# Patient Record
Sex: Female | Born: 1960 | Race: White | Hispanic: No | Marital: Married | Smoking: Former smoker
Health system: Southern US, Community
[De-identification: ages and names within clinical notes are randomized; demographics above are authoritative.]

## PROBLEM LIST (undated history)

## (undated) DIAGNOSIS — F329 Major depressive disorder, single episode, unspecified: Secondary | ICD-10-CM

## (undated) DIAGNOSIS — D2339 Other benign neoplasm of skin of other parts of face: Secondary | ICD-10-CM

## (undated) DIAGNOSIS — N390 Urinary tract infection, site not specified: Secondary | ICD-10-CM

## (undated) DIAGNOSIS — K921 Melena: Secondary | ICD-10-CM

## (undated) DIAGNOSIS — B019 Varicella without complication: Secondary | ICD-10-CM

## (undated) DIAGNOSIS — K219 Gastro-esophageal reflux disease without esophagitis: Secondary | ICD-10-CM

## (undated) DIAGNOSIS — T7840XA Allergy, unspecified, initial encounter: Secondary | ICD-10-CM

## (undated) DIAGNOSIS — F32A Depression, unspecified: Secondary | ICD-10-CM

## (undated) DIAGNOSIS — F419 Anxiety disorder, unspecified: Secondary | ICD-10-CM

## (undated) DIAGNOSIS — H269 Unspecified cataract: Secondary | ICD-10-CM

## (undated) HISTORY — DX: Melena: K92.1

## (undated) HISTORY — DX: Gastro-esophageal reflux disease without esophagitis: K21.9

## (undated) HISTORY — PX: ABDOMINAL HYSTERECTOMY: SHX81

## (undated) HISTORY — PX: APPENDECTOMY: SHX54

## (undated) HISTORY — DX: Varicella without complication: B01.9

## (undated) HISTORY — PX: TUBAL LIGATION: SHX77

## (undated) HISTORY — DX: Unspecified cataract: H26.9

## (undated) HISTORY — PX: OTHER SURGICAL HISTORY: SHX169

## (undated) HISTORY — DX: Urinary tract infection, site not specified: N39.0

## (undated) HISTORY — DX: Other benign neoplasm of skin of other parts of face: D23.39

## (undated) HISTORY — DX: Allergy, unspecified, initial encounter: T78.40XA

## (undated) HISTORY — PX: CHOLECYSTECTOMY: SHX55

## (undated) HISTORY — PX: TONSILLECTOMY: SUR1361

---

## 2000-01-16 HISTORY — PX: TOTAL VAGINAL HYSTERECTOMY: SHX2548

## 2010-08-29 ENCOUNTER — Ambulatory Visit: Payer: Self-pay | Admitting: General Practice

## 2010-08-31 ENCOUNTER — Ambulatory Visit: Payer: Self-pay | Admitting: General Practice

## 2011-06-04 ENCOUNTER — Ambulatory Visit: Payer: Self-pay | Admitting: Family Medicine

## 2012-02-25 ENCOUNTER — Ambulatory Visit: Payer: Self-pay | Admitting: General Practice

## 2013-01-19 DIAGNOSIS — R768 Other specified abnormal immunological findings in serum: Secondary | ICD-10-CM | POA: Insufficient documentation

## 2013-04-18 ENCOUNTER — Emergency Department (HOSPITAL_COMMUNITY): Payer: No Typology Code available for payment source

## 2013-04-18 ENCOUNTER — Encounter (HOSPITAL_COMMUNITY): Payer: Self-pay | Admitting: Emergency Medicine

## 2013-04-18 ENCOUNTER — Emergency Department (HOSPITAL_COMMUNITY)
Admission: EM | Admit: 2013-04-18 | Discharge: 2013-04-18 | Disposition: A | Discharge status: Home / Self Care | Payer: No Typology Code available for payment source | Attending: Emergency Medicine | Admitting: Emergency Medicine

## 2013-04-18 DIAGNOSIS — Z8659 Personal history of other mental and behavioral disorders: Secondary | ICD-10-CM | POA: Insufficient documentation

## 2013-04-18 DIAGNOSIS — W010XXA Fall on same level from slipping, tripping and stumbling without subsequent striking against object, initial encounter: Secondary | ICD-10-CM | POA: Insufficient documentation

## 2013-04-18 DIAGNOSIS — F17200 Nicotine dependence, unspecified, uncomplicated: Secondary | ICD-10-CM | POA: Insufficient documentation

## 2013-04-18 DIAGNOSIS — S93409A Sprain of unspecified ligament of unspecified ankle, initial encounter: Secondary | ICD-10-CM | POA: Insufficient documentation

## 2013-04-18 DIAGNOSIS — S43402A Unspecified sprain of left shoulder joint, initial encounter: Secondary | ICD-10-CM

## 2013-04-18 DIAGNOSIS — S93401A Sprain of unspecified ligament of right ankle, initial encounter: Secondary | ICD-10-CM

## 2013-04-18 DIAGNOSIS — IMO0002 Reserved for concepts with insufficient information to code with codable children: Secondary | ICD-10-CM | POA: Insufficient documentation

## 2013-04-18 DIAGNOSIS — Y929 Unspecified place or not applicable: Secondary | ICD-10-CM | POA: Insufficient documentation

## 2013-04-18 DIAGNOSIS — Y9301 Activity, walking, marching and hiking: Secondary | ICD-10-CM | POA: Insufficient documentation

## 2013-04-18 HISTORY — DX: Depression, unspecified: F32.A

## 2013-04-18 HISTORY — DX: Anxiety disorder, unspecified: F41.9

## 2013-04-18 HISTORY — DX: Major depressive disorder, single episode, unspecified: F32.9

## 2013-04-18 MED ORDER — OXYCODONE-ACETAMINOPHEN 5-325 MG PO TABS
1.0000 | ORAL_TABLET | Freq: Once | ORAL | Status: AC
  Administered 2013-04-18: 1 via ORAL
  Filled 2013-04-18: qty 1

## 2013-04-18 MED ORDER — IBUPROFEN 800 MG PO TABS
800.0000 mg | ORAL_TABLET | Freq: Once | ORAL | Status: AC
  Administered 2013-04-18: 800 mg via ORAL
  Filled 2013-04-18: qty 1

## 2013-04-18 MED ORDER — OXYCODONE-ACETAMINOPHEN 5-325 MG PO TABS: 1.0000 | ORAL_TABLET | ORAL | Status: DC | PRN

## 2013-04-18 NOTE — Discharge Instructions (Signed)

## 2013-04-18 NOTE — ED Provider Notes (Signed)
CSN: [id number]     Arrival date & time 04/18/13  1435 History  This chart was scribed for Sharyon Cable, MD,  by Stacy Gardner, ED Scribe. The patient was seen in room [facility] and the patient's care was started at 3:30 PM.   First MD Initiated Contact with Patient 04/18/13 1524     Chief Complaint  Patient presents with  . Fall      The history is provided by the patient and medical records. No language interpreter was used.   HPI Comments: Meghan Young is a 53 y.o. female who presents to the Emergency Department complaining of fall that occurred four hours ago. Pt mentions that she was walking downstairs when she was carrying a dolly when she slipped. Pt was able to grab a ramp to break her fall. She reports injuring her right ankle and right arm. She mentions having mild swelling to her R wrist and hand.  The pain is worse at her shoulder and wrist. Denies LOC and head injury.  She did not land on left shoulder, but she reports "twisting" the shoulder while trying to stop her fall Past Medical History  Diagnosis Date  . Anxiety   . Depression    Past Surgical History  Procedure Laterality Date  . Tonsillectomy    . Cholecystectomy    . Appendectomy    . Tubal ligation    . Abdominal hysterectomy     No family history on file. History  Substance Use Topics  . Smoking status: Current Some Day Smoker  . Smokeless tobacco: Not on file  . Alcohol Use: Yes     Comment: daily   OB History   Grav Para Term Preterm Abortions TAB SAB Ect Mult Living                 Review of Systems  Musculoskeletal: Positive for arthralgias, joint swelling and myalgias.  All other systems reviewed and are negative.      Allergies  Review of patient's allergies indicates no known allergies.  Home Medications  No current outpatient prescriptions on file. BP 115/74  Pulse 102  Temp(Src) 97.8 F (36.6 C) (Oral)  Resp 18  Ht 5' 3.5" (1.613 m)  Wt 220 lb (99.791 kg)   BMI 38.36 kg/m2  SpO2 100% Physical Exam CONSTITUTIONAL: Well developed/well nourished HEAD: Normocephalic/atraumatic EYES: EOMI/PERRL ENMT: Mucous membranes moist NECK: supple no meningeal signs SPINE:entire spine nontender CV: S1/S2 noted, no murmurs/rubs/gallops noted LUNGS: Lungs are clear to auscultation bilaterally, no apparent distress ABDOMEN: soft, nontender, no rebound or guarding GU:no cva tenderness NEURO: Pt is awake/alert, moves all extremitiesx4 EXTREMITIES: pulses normal, full ROM, tenderness to palpation of the L shoulder, no deformity , she is able to abduct shoulder, tenderness to palpation of R ankle with minimal swelling. All other extremities/joints palpated/ranged and nontender SKIN: warm, color normal PSYCH: anxious   ED Course  Procedures  DIAGNOSTIC STUDIES: Oxygen Saturation is 100% on room air, normal by my interpretation.    COORDINATION OF CARE:  3:44 PM Discussed course of care with pt . Pt understands and agrees. Imaging Review Dg Ankle Complete Right  04/18/2013   CLINICAL DATA:  Ankle pain  EXAM: RIGHT ANKLE - COMPLETE 3+ VIEW  COMPARISON:  None.  FINDINGS: Generalized soft tissue swelling is noted. No acute fracture or dislocation is seen. A small plantar spur is noted.  IMPRESSION: Soft tissue swelling without acute bony abnormality.   Electronically Signed   By: Inez Catalina  M.D.   On: 04/18/2013 15:27   Dg Shoulder Left  04/18/2013   CLINICAL DATA:  Pain post trauma  EXAM: LEFT SHOULDER - 2+ VIEW  COMPARISON:  None.  FINDINGS: Frontal, Y scapular, and axillary images were obtained. No fracture or dislocation. Joint spaces appear intact. No erosive change.  IMPRESSION: No abnormality noted.   Electronically Signed   By: Lowella Grip M.D.   On: 04/18/2013 15:30    I doubt occult fracture or dislocation to left shoulder Pt reports she can ambulate Advised that she may have significant disruption to left rotator cuff but no bony injury, but may  take significant time to heal Advised appropriate use of sling and pain meds (utilize ibuprofen) Ortho f/u advised     MDM   Final diagnoses:  Sprain of left shoulder  Right ankle sprain    Nursing notes including past medical history and social history reviewed and considered in documentation xrays reviewed and considered   I personally performed the services described in this documentation, which was scribed in my presence. The recorded information has been reviewed and is accurate.     Sharyon Cable, MD 04/18/13 [phone]

## 2013-04-18 NOTE — ED Notes (Signed)
Pt reports fell this am around 0700 and c/o pain to left shoulder and r ankle.  PT says foot slipped on steps.

## 2013-04-20 ENCOUNTER — Telehealth: Payer: Self-pay | Admitting: Orthopedic Surgery

## 2013-04-20 NOTE — Telephone Encounter (Signed)
Patient called today to request an appointment with Dr. Aline Brochure for a follow up from [facility] for a left shoulder injury.Told her Dr, Aline Brochure is out of the Office this week and offered an appointment for 04/27/13.  She did not want to wait that long and said she will call other offices to try to get a sooner appointment. Told her to call us back if she decides to schedule here next week

## 2014-09-28 ENCOUNTER — Ambulatory Visit
Admission: RE | Admit: 2014-09-28 | Discharge: 2014-09-28 | Disposition: A | Discharge status: Home / Self Care | Payer: No Typology Code available for payment source | Source: Ambulatory Visit | Attending: Physician Assistant | Admitting: Physician Assistant

## 2014-09-28 ENCOUNTER — Other Ambulatory Visit: Payer: Self-pay | Admitting: Physician Assistant

## 2014-09-28 DIAGNOSIS — R062 Wheezing: Secondary | ICD-10-CM | POA: Diagnosis present

## 2014-09-28 DIAGNOSIS — R509 Fever, unspecified: Secondary | ICD-10-CM

## 2014-09-28 DIAGNOSIS — R059 Cough, unspecified: Secondary | ICD-10-CM

## 2014-09-28 DIAGNOSIS — R05 Cough: Secondary | ICD-10-CM

## 2014-11-18 ENCOUNTER — Other Ambulatory Visit: Payer: Self-pay | Admitting: Family Medicine

## 2014-11-18 DIAGNOSIS — Z1231 Encounter for screening mammogram for malignant neoplasm of breast: Secondary | ICD-10-CM

## 2015-01-04 ENCOUNTER — Encounter: Payer: Self-pay | Admitting: Physician Assistant

## 2015-01-04 ENCOUNTER — Ambulatory Visit: Payer: Self-pay | Admitting: Physician Assistant

## 2015-01-04 VITALS — BP 139/80 | HR 96 | Temp 98.5°F

## 2015-01-04 DIAGNOSIS — R300 Dysuria: Secondary | ICD-10-CM

## 2015-01-04 LAB — POCT URINALYSIS DIPSTICK
GLUCOSE UA: NEGATIVE
Ketones, UA: NEGATIVE
Leukocytes, UA: NEGATIVE
NITRITE UA: NEGATIVE
Spec Grav, UA: >=1.03
UROBILINOGEN UA: 0.2
pH, UA: 5.5

## 2015-01-04 NOTE — Addendum Note (Signed)
Addended by: Rudene Anda T on: 01/04/2015 02:09 PM   Modules accepted: Orders

## 2015-01-04 NOTE — Progress Notes (Signed)
S: c/o pelvic pain, some burning with urination, no fever/chills, some low back pain, similar sx when uterus had dropped  O: vitals wnl, nad, lungs c t a, cv rrr, abd soft nontender no masses felt, ua trace blood  A: dysuria  P: culture urine, f/u with gyn

## 2015-01-05 ENCOUNTER — Other Ambulatory Visit: Payer: Self-pay | Admitting: Physician Assistant

## 2015-01-06 LAB — URINE CULTURE

## 2015-01-07 NOTE — Progress Notes (Signed)
Left message on patient's voicemail.

## 2015-01-12 ENCOUNTER — Other Ambulatory Visit: Payer: Self-pay | Admitting: Emergency Medicine

## 2015-01-12 MED ORDER — METHOCARBAMOL 500 MG PO TABS: 500.0000 mg | ORAL_TABLET | Freq: Four times a day (QID) | ORAL | Status: DC

## 2015-01-12 NOTE — Telephone Encounter (Signed)
Received a faxed medication request from [facility] on [address].  Please advise.  Thank you.

## 2015-01-12 NOTE — Telephone Encounter (Signed)
Med refill approved 

## 2015-03-16 ENCOUNTER — Ambulatory Visit: Payer: Self-pay | Admitting: Registered Nurse

## 2015-03-16 VITALS — BP 120/80 | HR 90 | Temp 97.7°F

## 2015-03-16 DIAGNOSIS — H6593 Unspecified nonsuppurative otitis media, bilateral: Secondary | ICD-10-CM

## 2015-03-16 DIAGNOSIS — J0100 Acute maxillary sinusitis, unspecified: Secondary | ICD-10-CM

## 2015-03-16 DIAGNOSIS — B349 Viral infection, unspecified: Secondary | ICD-10-CM

## 2015-03-16 MED ORDER — FLUTICASONE PROPIONATE 50 MCG/ACT NA SUSP: 2.0000 | Freq: Every day | NASAL | Status: DC

## 2015-03-16 MED ORDER — DOXYCYCLINE HYCLATE 100 MG PO TABS: 100.0000 mg | ORAL_TABLET | Freq: Two times a day (BID) | ORAL | Status: DC

## 2015-03-16 MED ORDER — ONDANSETRON 4 MG PO TBDP: 4.0000 mg | ORAL_TABLET | Freq: Two times a day (BID) | ORAL | Status: AC | PRN

## 2015-03-16 MED ORDER — BENZONATATE 200 MG PO CAPS: 200.0000 mg | ORAL_CAPSULE | Freq: Three times a day (TID) | ORAL | Status: AC | PRN

## 2015-03-16 NOTE — Progress Notes (Signed)
Subjective:    Patient ID: Meghan Young, female    DOB: 1960-10-28, 55 y.o.   MRN: [id number]  HPI Comments: Married caucasian female with sick contacts at work spouse with cancer and cares for grandchildren wants to know if she has the flu three days of bodyaches, diarrhea typically upon awakening daily, headache, nausea had phenergan at home taking prn but making her sleepy and old Rx wants new Rx for nonsleepy antinausea medication, this doesn't feel like her typical bronchitis; also wanting something for cough  Diarrhea  This is a recurrent problem. The current episode started in the past 7 days. The problem occurs less than 2 times per day. The problem has been unchanged. The stool consistency is described as watery. Associated symptoms include chills, coughing, headaches, myalgias, sweats and a URI. Pertinent negatives include no abdominal pain, arthralgias, bloating, fever, increased  flatus, vomiting or weight loss. Nothing aggravates the symptoms. Risk factors include ill contacts. She has tried change of diet and increased fluids for the symptoms. The treatment provided no relief. There is no history of bowel resection, inflammatory bowel disease, irritable bowel syndrome, malabsorption, a recent abdominal surgery or short gut syndrome.  Sinusitis Associated symptoms include chills, congestion, coughing, headaches, sinus pressure and a sore throat. Pertinent negatives include no diaphoresis, ear pain, neck pain, shortness of breath or sneezing.      Review of Systems  Constitutional: Positive for chills. Negative for fever, weight loss, diaphoresis, activity change, appetite change, fatigue and unexpected weight change.  HENT: Positive for congestion, nosebleeds, postnasal drip, sinus pressure and sore throat. Negative for dental problem, drooling, ear discharge, ear pain, facial swelling, hearing loss, mouth sores, rhinorrhea, sneezing, tinnitus, trouble swallowing and voice change.    Eyes: Negative for photophobia, pain, discharge, redness, itching and visual disturbance.  Respiratory: Positive for cough. Negative for choking, chest tightness, shortness of breath, wheezing and stridor.   Cardiovascular: Negative for chest pain, palpitations and leg swelling.  Gastrointestinal: Positive for diarrhea. Negative for nausea, vomiting, abdominal pain, constipation, blood in stool, abdominal distention, bloating and flatus.  Endocrine: Negative for cold intolerance and heat intolerance.  Genitourinary: Negative for dysuria, hematuria and difficulty urinating.  Musculoskeletal: Positive for myalgias. Negative for back pain, joint swelling, arthralgias, gait problem, neck pain and neck stiffness.  Skin: Negative for color change, pallor, rash and wound.  Allergic/Immunologic: Positive for environmental allergies. Negative for food allergies.  Neurological: Positive for headaches. Negative for dizziness, tremors, seizures, syncope, facial asymmetry, speech difficulty, weakness, light-headedness and numbness.  Hematological: Negative for adenopathy. Does not bruise/bleed easily.  Psychiatric/Behavioral: Positive for sleep disturbance. Negative for behavioral problems, confusion and agitation.       Objective:   Physical Exam  Constitutional: She is oriented to person, place, and time. She appears well-developed and well-nourished. She is active and cooperative.  Non-toxic appearance. She does not have a sickly appearance. She appears ill. No distress.  HENT:  Head: Normocephalic and atraumatic.  Right Ear: Hearing, external ear and ear canal normal. A middle ear effusion is present.  Left Ear: Hearing, external ear and ear canal normal. A middle ear effusion is present.  Nose: Mucosal edema and rhinorrhea present. No nose lacerations, sinus tenderness, nasal deformity, septal deviation or nasal septal hematoma. No epistaxis.  No foreign bodies. Right sinus exhibits frontal sinus  tenderness. Right sinus exhibits no maxillary sinus tenderness. Left sinus exhibits frontal sinus tenderness. Left sinus exhibits no maxillary sinus tenderness.  Mouth/Throat: Uvula is midline and mucous  membranes are normal. Mucous membranes are not pale, not dry and not cyanotic. She does not have dentures. No oral lesions. No trismus in the jaw. Normal dentition. No dental abscesses, uvula swelling, lacerations or dental caries. Posterior oropharyngeal edema and posterior oropharyngeal erythema present. No oropharyngeal exudate or tonsillar abscesses.  Cobblestoning posterior pharynx; bilateral nasal turbinates edema/erythema/yellow discharge; bilateral TMs with air fluid level clear  Eyes: Conjunctivae, EOM and lids are normal. Pupils are equal, round, and reactive to light. Right eye exhibits no chemosis, no discharge, no exudate and no hordeolum. No foreign body present in the right eye. Left eye exhibits no chemosis, no discharge, no exudate and no hordeolum. No foreign body present in the left eye. Right conjunctiva is not injected. Right conjunctiva has no hemorrhage. Left conjunctiva is not injected. Left conjunctiva has no hemorrhage. No scleral icterus. Right eye exhibits normal extraocular motion and no nystagmus. Left eye exhibits normal extraocular motion and no nystagmus. Right pupil is round and reactive. Left pupil is round and reactive. Pupils are equal.  Neck: Trachea normal and normal range of motion. Neck supple. No tracheal tenderness, no spinous process tenderness and no muscular tenderness present. No rigidity. No tracheal deviation, no edema, no erythema and normal range of motion present. No thyroid mass and no thyromegaly present.  Cardiovascular: Normal rate, regular rhythm, S1 normal, S2 normal, normal heart sounds and intact distal pulses.  PMI is not displaced.  Exam reveals no gallop and no friction rub.   No murmur heard. Pulmonary/Chest: Effort normal and breath sounds  normal. No accessory muscle usage or stridor. No respiratory distress. She has no decreased breath sounds. She has no wheezes. She has no rhonchi. She has no rales. She exhibits no tenderness.  Nonproductive cough with deep inspiration  Abdominal: Soft. Normal appearance and bowel sounds are normal. She exhibits no shifting dullness, no distension, no pulsatile liver, no fluid wave, no abdominal bruit, no ascites, no pulsatile midline mass and no mass. There is no hepatosplenomegaly. There is no tenderness. There is no rigidity, no rebound, no guarding, no CVA tenderness, no tenderness at McBurney's point and negative Murphy's sign. Hernia confirmed negative in the ventral area.  Dull to percussion x 4 quads; normoactive bowel sounds x 4 quads  Musculoskeletal: Normal range of motion. She exhibits no edema or tenderness.       Right shoulder: Normal.       Left shoulder: Normal.       Right hip: Normal.       Left hip: Normal.       Right knee: Normal.       Left knee: Normal.       Cervical back: Normal.       Right hand: Normal.       Left hand: Normal.  Lymphadenopathy:       Head (right side): No submental, no submandibular, no tonsillar, no preauricular, no posterior auricular and no occipital adenopathy present.       Head (left side): No submental, no submandibular, no tonsillar, no preauricular, no posterior auricular and no occipital adenopathy present.    She has no cervical adenopathy.       Right cervical: No superficial cervical, no deep cervical and no posterior cervical adenopathy present.      Left cervical: No superficial cervical, no deep cervical and no posterior cervical adenopathy present.  Neurological: She is alert and oriented to person, place, and time. She has normal strength. She is not disoriented.  She displays no atrophy and no tremor. No cranial nerve deficit or sensory deficit. She exhibits normal muscle tone. She displays no seizure activity. Coordination and gait  normal. GCS eye subscore is 4. GCS verbal subscore is 5. GCS motor subscore is 6.  Skin: Skin is warm, dry and intact. No abrasion, no bruising, no burn, no ecchymosis, no laceration, no lesion, no petechiae and no rash noted. She is not diaphoretic. No cyanosis or erythema. No pallor. Nails show no clubbing.  Psychiatric: She has a normal mood and affect. Her speech is normal and behavior is normal. Judgment and thought content normal. Cognition and memory are normal.  Nursing note and vitals reviewed.     1200 notified rapid flu results negative patient verbalized understanding information and had no further questions at this time.    Assessment & Plan:  A-viral illness, acute frontal sinusitis recurrent, viral gastroenteritis, otitis media with effusion bilateral, pharyngitis acute P-Supportive treatment.   No evidence of invasive bacterial infection, non toxic and well hydrated.  This is most likely self limiting viral infection.  I do not see where any further testing or imaging is necessary at this time.   I will suggest supportive care, rest, good hygiene and encourage the patient to take adequate fluids.  The patient is to return to clinic or EMERGENCY ROOM if symptoms worsen or change significantly e.g. ear pain, fever, purulent discharge from ears or bleeding.  Patient verbalized agreement and understanding of treatment plan.    Patient notified rapid strep negative.  Suspect Viral illness: no evidence of invasive bacterial infection, non toxic and well hydrated.  This is most likely self limiting viral infection.  I do not see where any further testing or imaging is necessary at this time.   I will suggest supportive care, rest, good hygiene and encourage the patient to take adequate fluids.  Does not require work excuse.  flonase 1 spray each nostril BID prn, nasal saline 1-2 sprays each nostril prn q2h, tylenol 1000mg  po QID prn pain/fever.  Discussed honey with lemon and salt water  gargles for comfort also.  The patient is to return to clinic or EMERGENCY ROOM if symptoms worsen or change significantly e.g. fever, lethargy, SOB, wheezing.   Patient verbalized agreement and understanding of treatment plan.    Restart flonase 1 spray each nostril BID, saline 2 sprays each nostril q2h prn congestion.  If no improvement with 48 hours of saline and flonase use start doxycycline 100mg  po BID x 10 days.  Rx given.  No evidence of systemic bacterial infection, non toxic and well hydrated.  I do not see where any further testing or imaging is necessary at this time.   I will suggest supportive care, rest, good hygiene and encourage the patient to take adequate fluids.  The patient is to return to clinic or EMERGENCY ROOM if symptoms worsen or change significantly.  Patient verbalized agreement and understanding of treatment plan and had no further questions at this time.   P2:  Hand washing and cover cough  Tessalon pearles 200mg  po TID prn cough.  Doxycycline 100mg  po BID x 10 days Rx given.  Bronchitis simple, community acquired, may have started as viral (probably respiratory syncytial, parainfluenza, influenza, or adenovirus), but now evidence of acute purulent bronchitis with resultant bronchial edema and mucus formation.  Viruses are the most common cause of bronchial inflammation in otherwise healthy adults with acute bronchitis.  The appearance of sputum is not predictive of whether  a bacterial infection is present.  Purulent sputum is most often caused by viral infections.  There are a small portion of those caused by non-viral agents being Mycoplamsa pneumonia.  Microscopic examination or C&S of sputum in the healthy adult with acute bronchitis is generally not helpful (usually negative or normal respiratory flora) other considerations being cough from upper respiratory tract infections, sinusitis or allergic syndromes (mild asthma or viral pneumonia).  Differential Diagnosis:  reactive  airway disease (asthma, allergic aspergillosis (eosinophilia), chronic bronchitis, respiratory infection (Sinusitis, Common cold, pneumonia), congestive heart failure, reflux esophagitis, bronchogenic tumor, aspiration syndromes and/or exposure irritants/tobacco smoke.  In this case, there is no evidence of any invasive bacterial illness.  Most likely viral etiology so will hold on antibiotic treatment.  Advise supportive care with rest, encourage fluids, good hygiene and watch for any worsening symptoms.  If they were to develop:  come back to the office or go to the emergency room if after hours. Without high fever, severe dyspnea, lack of physical findings or other risk factors, I will hold on a chest radiograph and CBC at this time. I discussed that approximately 50% of patients with acute bronchitis have a cough that lasts up to three weeks, and 25% for over a month.  Tylenol, one to two tablets every four hours as needed for fever or myalgias.   No aspirin.  Patient instructed to follow up in one week or sooner if symptoms worsen. Patient verbalized agreement and understanding of treatment plan.  P2:  hand washing and cover cough  School/work excuse note given to patient for 24 hours.  Usually no specific medical treatment is needed if a virus is causing the sore throat.  The throat most often gets better on its own within 5 to 7 days.  Antibiotic medicine does not cure viral pharyngitis.   For acute pharyngitis caused by bacteria, your healthcare provider will prescribe an antibiotic.  Marland Kitchen Do not smoke.  Marland Kitchen Avoid secondhand smoke and other air pollutants.  . Use a cool mist humidifier to add moisture to the air.  . Get plenty of rest.  . You may want to rest your throat by talking less and eating a diet that is mostly liquid or soft for a day or two.   Marland Kitchen Nonprescription throat lozenges and mouthwashes should help relieve the soreness.   . Gargling with warm saltwater and drinking warm liquids may  help.  (You can make a saltwater solution by adding 1/4 teaspoon of salt to 8 ounces, or 240 mL, of warm water.)  . A nonprescription pain reliever such as aspirin, acetaminophen, or ibuprofen may ease general aches and pains.   FOLLOW UP with clinic provider if no improvements in the next 7-10 days.  Patient verbalized understanding of instructions and agreed with plan of care.  Rx zofran 4mg  ODT po BID prn nausea.  I have recommended clear fluids and bland diet.  Avoid dairy/spicy, fried and large portions of meat while having nausea.  If vomiting hold po intake x 1 hour.  Then sips clear fluids like broths, ginger ale, power ade, gatorade, pedialyte may advance to soft/bland if no vomiting x 24 hours and appetite returned otherwise hydration main focus.     Return to the clinic if symptoms persist or worsen; I have alerted the patient to call if high fever, dehydration, marked weakness, fainting, increased abdominal pain, blood in stool or vomit (red or black).    Patient verbalized agreement and understanding of treatment  plan and had no further questions at this time.

## 2015-03-30 ENCOUNTER — Encounter: Payer: Self-pay | Admitting: Physician Assistant

## 2015-03-30 ENCOUNTER — Ambulatory Visit: Payer: Self-pay | Admitting: Physician Assistant

## 2015-03-30 VITALS — BP 110/70 | HR 88 | Temp 98.2°F

## 2015-03-30 DIAGNOSIS — M25561 Pain in right knee: Secondary | ICD-10-CM

## 2015-03-30 MED ORDER — METHYLPREDNISOLONE 4 MG PO TBPK: ORAL_TABLET | ORAL | Status: AC

## 2015-03-30 NOTE — Progress Notes (Signed)
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; also was sick over last 2 weeks , ?had norovirus, tried to go back to work but then ended up being out a couple more days, was having v/d; no viral sx at this time but is out of vacation/leave time, will need intermittent fmla to go to specialist for knee   O: vitals wnl, nad, r knee swollen and tender, tender at patella, + large amount of grinding and crepitus on extension, ligaments appear intact but patient is guarding on drawer tests, able to walk without a limp once brace is in place, n/v intact  A: acute bursitis, ?patello-femoral syndrome  P: f/u with sports med, neoprene sleeve applied, medrol dose pack, ice, exercises explained to strengthen quad and inner thigh muscles

## 2015-04-07 NOTE — Progress Notes (Signed)
Patient ID: Meghan Young, female   DOB: 03-29-1960, 55 y.o.   MRN: [id number] Patient has been scheduled to see Dr. Hulan Saas at [facility] in [place] for 04/25/15 @ 9:30am.  Patient is aware of the appointment.

## 2015-04-08 ENCOUNTER — Telehealth: Payer: Self-pay | Admitting: Emergency Medicine

## 2015-04-08 NOTE — Telephone Encounter (Signed)
Patient called and expressed that she called [facility] and they were able to see her right away.  Her pain was getting to bad to wait until her April appointment. They are going to send her to get a MRI on her knee and they put her out of work due to a ? Meniscus tear.  She wanted to know if you can call her in a muscle relaxer such as Robaxin because she is having back spasms from the way she been walking due to the pain in her knee.

## 2015-04-11 MED ORDER — METHOCARBAMOL 500 MG PO TABS: 500.0000 mg | ORAL_TABLET | Freq: Four times a day (QID) | ORAL | Status: DC

## 2015-04-11 NOTE — Telephone Encounter (Signed)
Sent in rx for robaxin, pt has used this medication before and did well while taking it

## 2015-04-18 ENCOUNTER — Ambulatory Visit: Payer: No Typology Code available for payment source | Admitting: Family Medicine

## 2015-04-25 ENCOUNTER — Ambulatory Visit: Payer: No Typology Code available for payment source | Admitting: Family Medicine

## 2015-06-01 ENCOUNTER — Ambulatory Visit: Payer: Self-pay | Admitting: Physician Assistant

## 2015-06-01 ENCOUNTER — Encounter: Payer: Self-pay | Admitting: Physician Assistant

## 2015-06-01 VITALS — BP 120/70 | HR 81 | Temp 97.5°F

## 2015-06-01 DIAGNOSIS — H1013 Acute atopic conjunctivitis, bilateral: Secondary | ICD-10-CM

## 2015-06-01 DIAGNOSIS — R252 Cramp and spasm: Secondary | ICD-10-CM

## 2015-06-01 DIAGNOSIS — Z299 Encounter for prophylactic measures, unspecified: Secondary | ICD-10-CM

## 2015-06-01 MED ORDER — OLOPATADINE HCL 0.1 % OP SOLN: 1.0000 [drp] | Freq: Two times a day (BID) | OPHTHALMIC | Status: DC

## 2015-06-01 NOTE — Progress Notes (Signed)
S: is switching doctors and needs yearly labs prior to physical, also some leg cramps, is on a diuretic, states cramps have been so bad her toes are curled, tried to eat bananas and nuts but hasn't gotten a lot better, states she drinks a combination of water and diet sodas, also allergies are making her eyes burn and itch, no matting noted  O: vitals wnl, nad, skin with poor turgor, perrl eomi, conjunctiva injected, pt rubbing eyes a lot, lungs c t a, cv rrr,   A: allergic conjunctivitis, leg cramps, dehydration  P: patanol opth drops, drink water, stop caffeine, labs drawn today for exec panel, vit d, magnesium, and hep c screening

## 2015-06-02 NOTE — Progress Notes (Signed)
Patient ID: Meghan Young, female   DOB: Aug 16, 1960, 55 y.o.   MRN: [id number] Patient has been referred to see Dr. Jennette Kettle at [facility] in [place] on 06/09/15 at 2:30.  Patient has been notified.

## 2015-06-03 LAB — CMP12+LP+TP+TSH+6AC+CBC/D/PLT
ALBUMIN: 4.7 g/dL (ref 3.5–5.5)
ALK PHOS: 124 IU/L — AB (ref 39–117)
ALT: 40 IU/L — ABNORMAL HIGH (ref 0–32)
AST: 27 IU/L (ref 0–40)
Albumin/Globulin Ratio: 1.8 (ref 1.2–2.2)
BILIRUBIN TOTAL: 0.4 mg/dL (ref 0.0–1.2)
BUN / CREAT RATIO: 24 — AB (ref 9–23)
BUN: 17 mg/dL (ref 6–24)
Basophils Absolute: 0 10*3/uL (ref 0.0–0.2)
Basos: 0 %
CALCIUM: 9.4 mg/dL (ref 8.7–10.2)
CHLORIDE: 100 mmol/L (ref 96–106)
CHOL/HDL RATIO: 2 ratio (ref 0.0–4.4)
CREATININE: 0.72 mg/dL (ref 0.57–1.00)
Cholesterol, Total: 180 mg/dL (ref 100–199)
EOS (ABSOLUTE): 0.1 10*3/uL (ref 0.0–0.4)
EOS: 2 %
Free Thyroxine Index: 1.5 (ref 1.2–4.9)
GFR calc Af Amer: 111 mL/min/{1.73_m2} (ref >59)
GFR, EST NON AFRICAN AMERICAN: 96 mL/min/{1.73_m2} (ref >59)
GGT: 118 IU/L — ABNORMAL HIGH (ref 0–60)
Globulin, Total: 2.6 g/dL (ref 1.5–4.5)
Glucose: 91 mg/dL (ref 65–99)
HDL: 88 mg/dL (ref >39)
HEMOGLOBIN: 13.9 g/dL (ref 11.1–15.9)
Hematocrit: 41.1 % (ref 34.0–46.6)
IMMATURE GRANULOCYTES: 0 %
Immature Grans (Abs): 0 10*3/uL (ref 0.0–0.1)
Iron: 88 ug/dL (ref 27–159)
LDH: 181 IU/L (ref 119–226)
LDL CALC: 75 mg/dL (ref 0–99)
LYMPHS ABS: 2.1 10*3/uL (ref 0.7–3.1)
Lymphs: 35 %
MCH: 29.9 pg (ref 26.6–33.0)
MCHC: 33.8 g/dL (ref 31.5–35.7)
MCV: 88 fL (ref 79–97)
MONOS ABS: 0.4 10*3/uL (ref 0.1–0.9)
Monocytes: 6 %
NEUTROS ABS: 3.4 10*3/uL (ref 1.4–7.0)
NEUTROS PCT: 57 %
POTASSIUM: 4.3 mmol/L (ref 3.5–5.2)
Phosphorus: 4.1 mg/dL (ref 2.5–4.5)
Platelets: 250 10*3/uL (ref 150–379)
RBC: 4.65 x10E6/uL (ref 3.77–5.28)
RDW: 14.7 % (ref 12.3–15.4)
SODIUM: 142 mmol/L (ref 134–144)
T3 Uptake Ratio: 27 % (ref 24–39)
T4, Total: 5.4 ug/dL (ref 4.5–12.0)
TSH: 2.33 u[IU]/mL (ref 0.450–4.500)
Total Protein: 7.3 g/dL (ref 6.0–8.5)
Triglycerides: 83 mg/dL (ref 0–149)
URIC ACID: 5.1 mg/dL (ref 2.5–7.1)
VLDL CHOLESTEROL CAL: 17 mg/dL (ref 5–40)
WBC: 6.1 10*3/uL (ref 3.4–10.8)

## 2015-06-03 LAB — HCV COMMENT:

## 2015-06-03 LAB — HEPATITIS C ANTIBODY (REFLEX)

## 2015-06-03 LAB — MAGNESIUM: Magnesium: 2 mg/dL (ref 1.6–2.3)

## 2015-06-03 LAB — VITAMIN D 25 HYDROXY (VIT D DEFICIENCY, FRACTURES): VIT D 25 HYDROXY: 25.8 ng/mL — AB (ref 30.0–100.0)

## 2015-06-09 ENCOUNTER — Ambulatory Visit: Payer: Self-pay | Admitting: Family Medicine

## 2015-06-16 ENCOUNTER — Encounter: Payer: Self-pay | Admitting: Physician Assistant

## 2015-06-16 ENCOUNTER — Ambulatory Visit: Payer: Self-pay | Admitting: Physician Assistant

## 2015-06-16 VITALS — BP 124/90 | HR 82 | Temp 97.9°F

## 2015-06-16 DIAGNOSIS — J018 Other acute sinusitis: Secondary | ICD-10-CM

## 2015-06-16 DIAGNOSIS — M62838 Other muscle spasm: Secondary | ICD-10-CM

## 2015-06-16 DIAGNOSIS — R062 Wheezing: Secondary | ICD-10-CM

## 2015-06-16 MED ORDER — AZITHROMYCIN 250 MG PO TABS: ORAL_TABLET | ORAL | Status: DC

## 2015-06-16 MED ORDER — METHOCARBAMOL 500 MG PO TABS: 500.0000 mg | ORAL_TABLET | Freq: Four times a day (QID) | ORAL | Status: DC

## 2015-06-16 MED ORDER — PREDNISONE 10 MG PO TABS: 30.0000 mg | ORAL_TABLET | Freq: Every day | ORAL | Status: DC

## 2015-06-16 MED ORDER — ALBUTEROL SULFATE (2.5 MG/3ML) 0.083% IN NEBU: 2.5000 mg | INHALATION_SOLUTION | Freq: Four times a day (QID) | RESPIRATORY_TRACT | Status: DC | PRN

## 2015-06-16 NOTE — Progress Notes (Signed)
S: C/o runny nose and congestion for 3 days, + cough, hoarseness, wheezing, ear pain;  no fever, chills, cp/sob, v/d; mucus is yellow and thick, cough is sporadic, c/o of facial and dental pain. Also ? Refill on robaxin for muscle spasms, al;so has a lot of bronchitis, has been able to get nebulizer machine but needs nebules   Using otc meds: advil cold  O: PE: vitals wnl, nad, perrl eomi, normocephalic, tms dull, nasal mucosa red and swollen, throat injected, neck supple no lymph, lungs c t a, cv rrr, neuro intact  A:  Acute sinusitis   P: drink fluids, continue regular meds , use otc meds of choice, return if not improving in 5 days, return earlier if worsening. zpack , prednisone 30 mg qd x 3d, albuterol nebules prn, robaxin

## 2015-07-13 ENCOUNTER — Ambulatory Visit: Payer: Self-pay | Admitting: Family Medicine

## 2015-08-11 ENCOUNTER — Ambulatory Visit: Payer: Self-pay | Admitting: Family Medicine

## 2015-08-11 DIAGNOSIS — Z0289 Encounter for other administrative examinations: Secondary | ICD-10-CM

## 2015-09-20 ENCOUNTER — Encounter: Payer: Self-pay | Admitting: Physician Assistant

## 2015-09-20 ENCOUNTER — Ambulatory Visit: Payer: Self-pay | Admitting: Physician Assistant

## 2015-09-20 DIAGNOSIS — J018 Other acute sinusitis: Secondary | ICD-10-CM

## 2015-09-20 MED ORDER — METHYLPREDNISOLONE 4 MG PO TBPK: ORAL_TABLET | ORAL | 0 refills | Status: DC

## 2015-09-20 MED ORDER — MELOXICAM 15 MG PO TABS: 15.0000 mg | ORAL_TABLET | Freq: Every day | ORAL | 5 refills | Status: DC

## 2015-09-20 MED ORDER — AZITHROMYCIN 250 MG PO TABS: ORAL_TABLET | ORAL | 0 refills | Status: DC

## 2015-09-20 MED ORDER — HYDROCOD POLST-CPM POLST ER 10-8 MG/5ML PO SUER: 5.0000 mL | Freq: Two times a day (BID) | ORAL | 0 refills | Status: DC | PRN

## 2015-09-20 NOTE — Progress Notes (Signed)
S: C/o runny nose and congestion for 3 days with hoarse voice and dry cough, no fever, chills, cp/sob, v/d; mucus was green this am but clear throughout the day, cough is sporadic, also needs med refill on mobic  Using otc meds: robitussin  O: PE: vitals wnl , nad, perrl eomi, normocephalic, tms dull, nasal mucosa red and swollen, throat injected, neck supple no lymph, lungs c t a, cv rrr, neuro intact, cough is dry and hacking  A:  Acute  uri   P: drink fluids, continue regular meds , use otc meds of choice, return if not improving in 5 days, return earlier if worsening , zpack, medrol dose pack, tussionex, mobic, instructed pt to not take mobic while taking steroid pack

## 2015-10-24 ENCOUNTER — Telehealth: Payer: Self-pay | Admitting: Emergency Medicine

## 2015-10-24 DIAGNOSIS — M62838 Other muscle spasm: Secondary | ICD-10-CM

## 2015-10-24 MED ORDER — METHOCARBAMOL 500 MG PO TABS: 500.0000 mg | ORAL_TABLET | Freq: Four times a day (QID) | ORAL | 2 refills | Status: DC

## 2015-10-24 NOTE — Telephone Encounter (Signed)
Pt hurt her back, requesting robaxin to help with spasms, rx sent to [facility], pt to f/u in clinic if not improving in a few days

## 2015-10-24 NOTE — Telephone Encounter (Signed)
Patient called and expressed that she just got back from the beach and was experiencing back pain/spasm.  She wanted to know if we could call in Robaxin.  She said she would take the medication first and if it doesn't work she will come to the clinic to be evaluated.

## 2015-11-29 ENCOUNTER — Ambulatory Visit: Payer: Self-pay | Admitting: Physician Assistant

## 2015-11-29 ENCOUNTER — Encounter: Payer: Self-pay | Admitting: Physician Assistant

## 2015-11-29 VITALS — BP 110/80 | HR 101 | Temp 98.4°F

## 2015-11-29 DIAGNOSIS — F411 Generalized anxiety disorder: Secondary | ICD-10-CM

## 2015-11-29 DIAGNOSIS — R300 Dysuria: Secondary | ICD-10-CM

## 2015-11-29 LAB — POCT URINALYSIS DIPSTICK
GLUCOSE UA: NEGATIVE
Leukocytes, UA: NEGATIVE
NITRITE UA: NEGATIVE
PH UA: 6
RBC UA: NEGATIVE
UROBILINOGEN UA: 2

## 2015-11-29 NOTE — Progress Notes (Signed)
S: feeling more "down" than usual, was at the hospital tis weekend for birth of new grandchild and it upset her to watch her child suffer through childbirth, then they had a family friend to die last night when his house exploded, no si/hi, just had a really hard time getting out of bed, some pelvic pressure and ?burning, no fever/chills, did have some diarrhea, some nausea, better today, had body aches, just wants to make sure she's ok to be around new baby  O: vitals wnl, nad, pt tearful, ENT wnl, neck supple no lymph, lungs c t a, cv rrr, neuro intact  A: anxiety/depression, dysuria  P: urine culture, reassurance, took pt to [facility] to make appointment

## 2015-11-30 LAB — URINE CULTURE

## 2015-12-01 ENCOUNTER — Telehealth: Payer: Self-pay | Admitting: Physician Assistant

## 2015-12-01 NOTE — Telephone Encounter (Signed)
Patient contacted office to see if she could have an intermittent FMLA from [employer] for depression. She has an appt with Dr. Cleon Gustin 01/04/16. Per Manuela Schwartz informed patient that she would be unable to do a intermittent FMLA  Dr. Cleon Gustin would have to authorize this.

## 2015-12-06 ENCOUNTER — Encounter: Payer: Self-pay | Admitting: Physician Assistant

## 2015-12-06 ENCOUNTER — Ambulatory Visit: Payer: Self-pay | Admitting: Physician Assistant

## 2015-12-06 VITALS — BP 130/80 | HR 85 | Temp 98.4°F

## 2015-12-06 DIAGNOSIS — J209 Acute bronchitis, unspecified: Secondary | ICD-10-CM

## 2015-12-06 MED ORDER — HYDROCOD POLST-CPM POLST ER 10-8 MG/5ML PO SUER: 5.0000 mL | Freq: Every evening | ORAL | 0 refills | Status: DC | PRN

## 2015-12-06 MED ORDER — DOXYCYCLINE HYCLATE 100 MG PO TABS: 100.0000 mg | ORAL_TABLET | Freq: Two times a day (BID) | ORAL | 0 refills | Status: DC

## 2015-12-06 MED ORDER — METHYLPREDNISOLONE 4 MG PO TBPK: ORAL_TABLET | ORAL | 0 refills | Status: DC

## 2015-12-06 NOTE — Progress Notes (Signed)
S: C/o cough and congestion with wheezing and chest pain, chest is sore from coughing, denies fever, chills, +mucus is green when she can get it out but mostly cough is dry and hacking; keeping pt awake at night;  denies cardiac type chest pain or sob, v/d, abd pain Remainder ros neg  O: vitals wnl, nad, tms clear, throat injected, neck supple no lymph, lungs c t a, cv rrr, neuro intact, cough is deep and barking  A:  Acute bronchitis   P:  rx medication: doxy, medrol dose pack, tussionex, smoking cessation, use otc meds, tylenol or motrin as needed for fever/chills, return if not better in 3 -5 days, return earlier if worsening

## 2016-01-18 ENCOUNTER — Encounter: Payer: Self-pay | Admitting: Physician Assistant

## 2016-01-18 ENCOUNTER — Ambulatory Visit: Payer: Self-pay | Admitting: Physician Assistant

## 2016-01-18 VITALS — BP 139/80 | HR 92 | Temp 98.7°F

## 2016-01-18 DIAGNOSIS — J069 Acute upper respiratory infection, unspecified: Secondary | ICD-10-CM

## 2016-01-18 MED ORDER — CEFDINIR 300 MG PO CAPS: 300.0000 mg | ORAL_CAPSULE | Freq: Two times a day (BID) | ORAL | 0 refills | Status: DC

## 2016-01-18 MED ORDER — METHYLPREDNISOLONE 4 MG PO TBPK: ORAL_TABLET | ORAL | 0 refills | Status: DC

## 2016-01-18 NOTE — Progress Notes (Signed)
S: C/o runny nose and congestion for 3 days, no fever, chills, cp/sob, v/d; mucus was green this am but clear throughout the day, cough is sporadic, dry and hacking  Using otc meds: robitussin  O: PE: vitals wnl, nad, perrl eomi, normocephalic, tms dull, nasal mucosa red and swollen, throat injected, neck supple no lymph, lungs c t a, cv rrr, neuro intact  A:  Acute uri   P: drink fluids, continue regular meds , use otc meds of choice, return if not improving in 5 days, return earlier if worsening , omnicef, medrol dose pack

## 2016-01-26 ENCOUNTER — Ambulatory Visit: Payer: Self-pay | Admitting: Physician Assistant

## 2016-01-26 ENCOUNTER — Encounter: Payer: Self-pay | Admitting: Physician Assistant

## 2016-01-26 ENCOUNTER — Other Ambulatory Visit: Payer: Self-pay | Admitting: Physician Assistant

## 2016-01-26 VITALS — BP 129/80 | HR 98 | Temp 98.6°F

## 2016-01-26 DIAGNOSIS — T148XXA Other injury of unspecified body region, initial encounter: Secondary | ICD-10-CM

## 2016-01-26 DIAGNOSIS — M549 Dorsalgia, unspecified: Secondary | ICD-10-CM

## 2016-01-26 LAB — POCT URINALYSIS DIPSTICK
BILIRUBIN UA: NEGATIVE
Blood, UA: NEGATIVE
GLUCOSE UA: NEGATIVE
Ketones, UA: NEGATIVE
LEUKOCYTES UA: NEGATIVE
Nitrite, UA: NEGATIVE
PROTEIN UA: NEGATIVE
Spec Grav, UA: 1.02
Urobilinogen, UA: 0.2
pH, UA: 6

## 2016-01-26 MED ORDER — OXYCODONE-ACETAMINOPHEN 10-325 MG PO TABS: 1.0000 | ORAL_TABLET | ORAL | 0 refills | Status: DC | PRN

## 2016-01-26 NOTE — Progress Notes (Signed)
S: c/o continued pain on left side of back, husband noticed that she had a lot of bruising in the area, no nosebleeds or gums bleeding, no hematuria, pain in that side has been like a knife sticking in her side, no rash other than the bruising, no v/d, no cp/sob, new med was added back in December-klonopin  O: vitals wnl, nad, pts back with swelling on left upper side near kidney area, +bruising, area is tender to palp, no vesicles or pustules noted, some bruising around a pimple on upper shoulder, lungs c t a, cv rrr, ua wnl  A: acute mid back pain, ?kidney stone vs shingles, ?bruising from blood dyscrasia due to taking klonopin  P: labs for cbc, met c, pt/ptt; percocet 10/325 #30 nr, stop robaxin, if blood dyscrasia will have her stop klonopin

## 2016-01-26 NOTE — Telephone Encounter (Signed)
Med refill approved 

## 2016-01-27 LAB — CBC WITH DIFFERENTIAL/PLATELET
BASOS ABS: 0 10*3/uL (ref 0.0–0.2)
BASOS: 0 %
EOS (ABSOLUTE): 0.3 10*3/uL (ref 0.0–0.4)
Eos: 3 %
Hematocrit: 40.8 % (ref 34.0–46.6)
Hemoglobin: 14.3 g/dL (ref 11.1–15.9)
Immature Grans (Abs): 0 10*3/uL (ref 0.0–0.1)
Immature Granulocytes: 0 %
Lymphocytes Absolute: 3.6 10*3/uL — ABNORMAL HIGH (ref 0.7–3.1)
Lymphs: 33 %
MCH: 30.8 pg (ref 26.6–33.0)
MCHC: 35 g/dL (ref 31.5–35.7)
MCV: 88 fL (ref 79–97)
MONOS ABS: 0.8 10*3/uL (ref 0.1–0.9)
Monocytes: 7 %
NEUTROS ABS: 6.2 10*3/uL (ref 1.4–7.0)
NEUTROS PCT: 57 %
PLATELETS: 298 10*3/uL (ref 150–379)
RBC: 4.64 x10E6/uL (ref 3.77–5.28)
RDW: 13.6 % (ref 12.3–15.4)
WBC: 10.9 10*3/uL — ABNORMAL HIGH (ref 3.4–10.8)

## 2016-01-27 LAB — COMPREHENSIVE METABOLIC PANEL
A/G RATIO: 1.9 (ref 1.2–2.2)
ALK PHOS: 116 IU/L (ref 39–117)
ALT: 38 IU/L — ABNORMAL HIGH (ref 0–32)
AST: 26 IU/L (ref 0–40)
Albumin: 4.6 g/dL (ref 3.5–5.5)
BILIRUBIN TOTAL: 0.3 mg/dL (ref 0.0–1.2)
BUN/Creatinine Ratio: 20 (ref 9–23)
BUN: 16 mg/dL (ref 6–24)
CHLORIDE: 98 mmol/L (ref 96–106)
CO2: 22 mmol/L (ref 18–29)
Calcium: 9 mg/dL (ref 8.7–10.2)
Creatinine, Ser: 0.79 mg/dL (ref 0.57–1.00)
GFR calc Af Amer: 98 mL/min/{1.73_m2} (ref >59)
GFR calc non Af Amer: 85 mL/min/{1.73_m2} (ref >59)
Globulin, Total: 2.4 g/dL (ref 1.5–4.5)
Glucose: 131 mg/dL — ABNORMAL HIGH (ref 65–99)
POTASSIUM: 3.5 mmol/L (ref 3.5–5.2)
Sodium: 142 mmol/L (ref 134–144)
Total Protein: 7 g/dL (ref 6.0–8.5)

## 2016-01-27 LAB — PT AND PTT
APTT: 25 s (ref 24–33)
INR: 0.9 (ref 0.8–1.2)
PROTHROMBIN TIME: 10 s (ref 9.1–12.0)

## 2016-02-20 ENCOUNTER — Encounter: Payer: Self-pay | Admitting: Physician Assistant

## 2016-02-20 ENCOUNTER — Ambulatory Visit: Payer: Self-pay | Admitting: Physician Assistant

## 2016-02-20 VITALS — BP 110/70 | HR 95 | Temp 98.5°F

## 2016-02-20 DIAGNOSIS — F3289 Other specified depressive episodes: Secondary | ICD-10-CM

## 2016-02-20 DIAGNOSIS — J029 Acute pharyngitis, unspecified: Secondary | ICD-10-CM

## 2016-02-20 NOTE — Progress Notes (Signed)
S: c/o sore throat on left side, no fever/chills/body aches, just hurts to swallow, no known exposure to strep, having a bad "mental health" day, depression is worse, called her psychiatrist, no si/hi  O: vitals wnl, nad, pt with depressed mood, tms clear, throat wnl, neck supple no lymph, lungs c t a, cv rrr,   A: sore throat, depression  P: gargle with warm salt water, f/u with dr Nicolasa Ducking, return if worsening

## 2016-04-09 ENCOUNTER — Encounter: Payer: Self-pay | Admitting: Physician Assistant

## 2016-04-09 ENCOUNTER — Ambulatory Visit: Payer: Self-pay | Admitting: Physician Assistant

## 2016-04-09 VITALS — BP 120/82 | HR 99 | Temp 97.5°F | Ht 63.0 in | Wt 235.0 lb

## 2016-04-09 DIAGNOSIS — Z0000 Encounter for general adult medical examination without abnormal findings: Secondary | ICD-10-CM

## 2016-04-09 DIAGNOSIS — Z008 Encounter for other general examination: Secondary | ICD-10-CM

## 2016-04-09 DIAGNOSIS — Z0189 Encounter for other specified special examinations: Secondary | ICD-10-CM

## 2016-04-09 NOTE — Progress Notes (Signed)
S: pt here for wellness physical and biometrics for insurance purposes, no complaints ros neg. PMH:   Depressions, anxiety, reflux, allergies  Social:  Former smoker, etoh on weekends, no drugs, married  Fam: father died at 56 of MI, M alive at 18, hx of raynauds, anxiety, B is 45 , healthy  O: vitals wnl, nad, ENT wnl, neck supple no lymph, lungs c t a, cv rrr, abd soft nontender bs normal all 4 quads  A: wellness, biometric physical  P: f/u with psychiatry, call for mammogram appointment, pt refusing referral for colonoscopy, labs drawn today

## 2016-04-11 ENCOUNTER — Other Ambulatory Visit: Payer: Self-pay | Admitting: Physician Assistant

## 2016-04-11 MED ORDER — VITAMIN D (ERGOCALCIFEROL) 1.25 MG (50000 UNIT) PO CAPS: 50000.0000 [IU] | ORAL_CAPSULE | ORAL | 3 refills | Status: DC

## 2016-04-11 NOTE — Progress Notes (Unsigned)
Vit d 50,00units 1 po q week, rx sent to [facility] on [address]  Pt's vit d was 49.

## 2016-04-14 LAB — VITAMIN D 25 HYDROXY (VIT D DEFICIENCY, FRACTURES): VIT D 25 HYDROXY: 18 ng/mL — AB (ref 30.0–100.0)

## 2016-04-14 LAB — CMP12+LP+TP+TSH+6AC+CBC/D/PLT
A/G RATIO: 1.7 (ref 1.2–2.2)
ALT: 40 IU/L — ABNORMAL HIGH (ref 0–32)
AST: 26 IU/L (ref 0–40)
Albumin: 4.5 g/dL (ref 3.5–5.5)
Alkaline Phosphatase: 135 IU/L — ABNORMAL HIGH (ref 39–117)
BILIRUBIN TOTAL: 0.4 mg/dL (ref 0.0–1.2)
BUN/Creatinine Ratio: 18 (ref 9–23)
BUN: 14 mg/dL (ref 6–24)
Basophils Absolute: 0 10*3/uL (ref 0.0–0.2)
Basos: 0 %
CHLORIDE: 99 mmol/L (ref 96–106)
CHOL/HDL RATIO: 2.3 ratio (ref 0.0–4.4)
CREATININE: 0.76 mg/dL (ref 0.57–1.00)
Calcium: 9.4 mg/dL (ref 8.7–10.2)
Cholesterol, Total: 180 mg/dL (ref 100–199)
EOS (ABSOLUTE): 0.1 10*3/uL (ref 0.0–0.4)
Eos: 2 %
Free Thyroxine Index: 1.2 (ref 1.2–4.9)
GFR, EST AFRICAN AMERICAN: 103 mL/min/{1.73_m2} (ref >59)
GFR, EST NON AFRICAN AMERICAN: 89 mL/min/{1.73_m2} (ref >59)
GGT: 158 IU/L — ABNORMAL HIGH (ref 0–60)
GLUCOSE: 104 mg/dL — AB (ref 65–99)
Globulin, Total: 2.7 g/dL (ref 1.5–4.5)
HDL: 80 mg/dL (ref >39)
HEMATOCRIT: 40.3 % (ref 34.0–46.6)
HEMOGLOBIN: 13.6 g/dL (ref 11.1–15.9)
IMMATURE GRANS (ABS): 0 10*3/uL (ref 0.0–0.1)
IRON: 111 ug/dL (ref 27–159)
Immature Granulocytes: 0 %
LDH: 172 IU/L (ref 119–226)
LDL Calculated: 79 mg/dL (ref 0–99)
LYMPHS: 30 %
Lymphocytes Absolute: 1.8 10*3/uL (ref 0.7–3.1)
MCH: 29.3 pg (ref 26.6–33.0)
MCHC: 33.7 g/dL (ref 31.5–35.7)
MCV: 87 fL (ref 79–97)
MONOCYTES: 6 %
Monocytes Absolute: 0.4 10*3/uL (ref 0.1–0.9)
NEUTROS PCT: 62 %
Neutrophils Absolute: 3.7 10*3/uL (ref 1.4–7.0)
PHOSPHORUS: 3.6 mg/dL (ref 2.5–4.5)
Platelets: 258 10*3/uL (ref 150–379)
Potassium: 3.7 mmol/L (ref 3.5–5.2)
RBC: 4.64 x10E6/uL (ref 3.77–5.28)
RDW: 14.2 % (ref 12.3–15.4)
Sodium: 143 mmol/L (ref 134–144)
T3 UPTAKE RATIO: 23 % — AB (ref 24–39)
T4, Total: 5.1 ug/dL (ref 4.5–12.0)
TOTAL PROTEIN: 7.2 g/dL (ref 6.0–8.5)
TSH: 2.22 u[IU]/mL (ref 0.450–4.500)
Triglycerides: 105 mg/dL (ref 0–149)
URIC ACID: 5.7 mg/dL (ref 2.5–7.1)
VLDL CHOLESTEROL CAL: 21 mg/dL (ref 5–40)
WBC: 6 10*3/uL (ref 3.4–10.8)

## 2016-04-14 LAB — B12 AND FOLATE PANEL: Vitamin B-12: 366 pg/mL (ref 232–1245)

## 2016-04-14 LAB — HIV ANTIBODY (ROUTINE TESTING W REFLEX): HIV SCREEN 4TH GENERATION: NONREACTIVE

## 2016-04-14 LAB — HEPATITIS C ANTIBODY (REFLEX): HCV AB: 0.1 {s_co_ratio} (ref 0.0–0.9)

## 2016-04-14 LAB — HCV COMMENT:

## 2016-05-02 LAB — SPECIMEN STATUS REPORT

## 2016-05-02 LAB — HGB A1C W/O EAG: HEMOGLOBIN A1C: 5.6 % (ref 4.8–5.6)

## 2016-09-07 ENCOUNTER — Ambulatory Visit: Payer: Self-pay | Admitting: Physician Assistant

## 2016-09-07 VITALS — BP 139/80 | HR 105 | Temp 98.5°F | Resp 16

## 2016-09-07 DIAGNOSIS — J069 Acute upper respiratory infection, unspecified: Secondary | ICD-10-CM

## 2016-09-07 MED ORDER — AZITHROMYCIN 250 MG PO TABS: ORAL_TABLET | ORAL | 0 refills | Status: DC

## 2016-09-07 MED ORDER — METHYLPREDNISOLONE 4 MG PO TBPK: ORAL_TABLET | ORAL | 0 refills | Status: DC

## 2016-09-07 MED ORDER — ALBUTEROL SULFATE HFA 108 (90 BASE) MCG/ACT IN AERS: 2.0000 | INHALATION_SPRAY | Freq: Four times a day (QID) | RESPIRATORY_TRACT | 0 refills | Status: DC | PRN

## 2016-09-07 NOTE — Progress Notes (Signed)
S: C/o runny nose and congestion for 3 days, no fever, chills, cp/sob, v/d; mucus was green this am but clear throughout the day, cough is sporadic, voice is hoarse, is wheezing, granddaughter has been sick  Using otc meds:   O: PE: vitals wnl, nad, perrl eomi, normocephalic, tms dull, nasal mucosa red and swollen, throat injected, neck supple no lymph, lungs c t a, cv rrr, neuro intact  A:  Acute uri   P: drink fluids, continue regular meds , use otc meds of choice, return if not improving in 5 days, return earlier if worsening , medrol dose pack, zpack

## 2016-10-19 ENCOUNTER — Other Ambulatory Visit: Payer: Self-pay | Admitting: Physician Assistant

## 2016-10-22 NOTE — Telephone Encounter (Signed)
Med refill for omeprazole approved, dosage was 20mg 

## 2017-01-11 ENCOUNTER — Ambulatory Visit: Payer: Self-pay | Admitting: Nurse Practitioner

## 2017-01-11 DIAGNOSIS — H109 Unspecified conjunctivitis: Secondary | ICD-10-CM

## 2017-01-11 MED ORDER — CIPROFLOXACIN HCL 0.3 % OP SOLN: 2.0000 [drp] | OPHTHALMIC | 0 refills | Status: AC

## 2017-01-11 NOTE — Progress Notes (Addendum)
Subjective:    Meghan Young is a 56 y.o. female who presents for evaluation of blurred vision, discharge, erythema, itching, pain and photophobia in both eyes. She has noticed the above symptoms for 2 days. Onset was sudden. Patient denies foreign body sensation and visual field deficit. There is a history of contact lens use and wearing glasses.  Patient states she has a history of cataracts bilaterally and that she wears both glasses and contacts.  Patient has been going without her contacts for the past 2 weeks. 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  The following portions of the patient's history were reviewed and updated as appropriate: allergies, current medications and past medical history.  Review of Systems Constitutional: negative Eyes: positive for cataracts, contacts/glasses, irritation, redness and blurred vision, mucoid discharge, photophobia and irritation, negative for color blindness Ears, nose, mouth, throat, and face: positive for sinus tenderness, negative for earaches, hoarseness, nasal congestion and sore throat Respiratory: negative Cardiovascular: negative Behavioral/Psych: negative Allergic/Immunologic: positive for hay fever   Objective:    There were no vitals taken for this visit.      General: alert, cooperative and mild distress  Eyes:  negative findings: lids and lashes normal, no diabetic or hypertensive changes visualized, no foreign body with everted lid and conjuntiva normal, PERRLA.  , positive findings: sclera erythematous  Vision: Not performed  Fluorescein:  not done     Assessment:    Acute conjunctivitis   Plan:    Discussed the diagnosis and proper care of conjunctivitis.  Stressed household Nurse, mental health. Ophthalmic drops per orders. Warm compress to eye(s). Local eye care discussed. Analgesics as needed. FU with PCP in 3 days or PRN. Work note provided for patient  today.  Patient will follow up with optometrist on Monday if no improvement.

## 2017-01-23 HISTORY — PX: CATARACT EXTRACTION: SUR2

## 2017-02-04 ENCOUNTER — Ambulatory Visit: Payer: Self-pay | Admitting: Internal Medicine

## 2017-02-11 ENCOUNTER — Ambulatory Visit: Payer: Self-pay | Admitting: Emergency Medicine

## 2017-02-11 DIAGNOSIS — J0100 Acute maxillary sinusitis, unspecified: Secondary | ICD-10-CM

## 2017-02-11 DIAGNOSIS — J209 Acute bronchitis, unspecified: Secondary | ICD-10-CM

## 2017-02-11 MED ORDER — AZITHROMYCIN 250 MG PO TABS: ORAL_TABLET | ORAL | 0 refills | Status: DC

## 2017-02-11 MED ORDER — PREDNISONE 10 MG (21) PO TBPK: ORAL_TABLET | ORAL | 0 refills | Status: DC

## 2017-02-11 MED ORDER — PROMETHAZINE-CODEINE 6.25-10 MG/5ML PO SYRP: ORAL_SOLUTION | ORAL | 0 refills | Status: DC

## 2017-02-11 MED ORDER — FLUTICASONE PROPIONATE 50 MCG/ACT NA SUSP: NASAL | 0 refills | Status: DC

## 2017-02-11 NOTE — Progress Notes (Signed)
URI HISTORY  Meghan Young is a 57 y.o. female who complains of onset of cold symptoms for 5 days.  Have been using over-the-counter treatment which helps a little bit.  No chills/sweats +  Fever +  Nasal congestion +  Discolored Post-nasal drainage Positive sinus pain/pressure No sore throat +  Cough, productive of yellow sputum. No wheezing Positive chest congestion No hemoptysis No shortness of breath No pleuritic pain No itchy/red eyes No earache No nausea No vomiting No abdominal pain No diarrhea No skin rashes +  Fatigue No myalgias No headache   Objective: Vital signs reviewed . Afebrile. Gen.: Appears fatigued. Coughing. No respiratory distress. TMs normal Nose: Boggy terminates seromucoid drainage. There is tenderness over maxillary sinuses bilaterally. Pharynx: Minimal injection, no erythema, tonsillar enlargement, or exudate Neck: Supple minimal shoddy, tender anterior cervical nodes Heart: Regular rate and rhythm Lungs: Few anterior rhonchi, otherwise clear. No wheezes or rales.  Assessment: Sinusitis/bronchitis Plans: Risk, benefits, alternatives discussed. Zithromax Z-PAK, prednisone 6 day Dosepak, Flonase,(erx'd) and 4 ounce prescription for Phenergan with codeine cough syrup.(printed)  Other OTC and nonpharmacologic symptomatic care discussed. An After Visit Summary was printed and given to the patient. Follow-up with your primary care doctor in 5-7 days if not improving, or sooner if symptoms become worse. Precautions discussed. Red flags discussed. Questions invited and answered. Patient voiced understanding and agreement.

## 2017-02-11 NOTE — Patient Instructions (Signed)
Sinusitis, Adult  Sinusitis is soreness and inflammation of your sinuses. Sinuses are hollow spaces in the bones around your face. Your sinuses are located:  Around your eyes.  In the middle of your forehead.  Behind your nose.  In your cheekbones.    Your sinuses and nasal passages are lined with a stringy fluid (mucus). Mucus normally drains out of your sinuses. When your nasal tissues become inflamed or swollen, the mucus can become trapped or blocked so air cannot flow through your sinuses. This allows bacteria, viruses, and funguses to grow, which leads to infection.  Sinusitis can develop quickly and last for 7?10 days (acute) or for more than 12 weeks (chronic). Sinusitis often develops after a cold.  What are the causes?  This condition is caused by anything that creates swelling in the sinuses or stops mucus from draining, including:  Allergies.  Asthma.  Bacterial or viral infection.  Abnormally shaped bones between the nasal passages.  Nasal growths that contain mucus (nasal polyps).  Narrow sinus openings.  Pollutants, such as chemicals or irritants in the air.  A foreign object stuck in the nose.  A fungal infection. This is rare.    What increases the risk?  The following factors may make you more likely to develop this condition:  Having allergies or asthma.  Having had a recent cold or respiratory tract infection.  Having structural deformities or blockages in your nose or sinuses.  Having a weak immune system.  Doing a lot of swimming or diving.  Overusing nasal sprays.  Smoking.    What are the signs or symptoms?  The main symptoms of this condition are pain and a feeling of pressure around the affected sinuses. Other symptoms include:  Upper toothache.  Earache.  Headache.  Bad breath.  Decreased sense of smell and taste.  A cough that may get worse at night.  Fatigue.  Fever.  Thick drainage from your nose. The drainage is often green and it may contain pus (purulent).  Stuffy nose or  congestion.  Postnasal drip. This is when extra mucus collects in the throat or back of the nose.  Swelling and warmth over the affected sinuses.  Sore throat.  Sensitivity to light.    How is this diagnosed?  This condition is diagnosed based on symptoms, a medical history, and a physical exam. To find out if your condition is acute or chronic, your health care provider may:  Look in your nose for signs of nasal polyps.  Tap over the affected sinus to check for signs of infection.  View the inside of your sinuses using an imaging device that has a light attached (endoscope).    If your health care provider suspects that you have chronic sinusitis, you may also:  Be tested for allergies.  Have a sample of mucus taken from your nose (nasal culture) and checked for bacteria.  Have a mucus sample examined to see if your sinusitis is related to an allergy.    If your sinusitis does not respond to treatment and it lasts longer than 8 weeks, you may have an MRI or CT scan to check your sinuses. These scans also help to determine how severe your infection is.  In rare cases, a bone biopsy may be done to rule out more serious types of fungal sinus disease.  How is this treated?  Treatment for sinusitis depends on the cause and whether your condition is chronic or acute. If a virus is causing   your sinusitis, your symptoms will go away on their own within 10 days. You may be given medicines to relieve your symptoms, including:  Topical nasal decongestants. They shrink swollen nasal passages and let mucus drain from your sinuses.  Antihistamines. These drugs block inflammation that is triggered by allergies. This can help to ease swelling in your nose and sinuses.  Topical nasal corticosteroids. These are nasal sprays that ease inflammation and swelling in your nose and sinuses.  Nasal saline washes. These rinses can help to get rid of thick mucus in your nose.    If your condition is caused by bacteria, you will be given an  antibiotic medicine. If your condition is caused by a fungus, you will be given an antifungal medicine.  Surgery may be needed to correct underlying conditions, such as narrow nasal passages. Surgery may also be needed to remove polyps.  Follow these instructions at home:  Medicines  Take, use, or apply over-the-counter and prescription medicines only as told by your health care provider. These may include nasal sprays.  If you were prescribed an antibiotic medicine, take it as told by your health care provider. Do not stop taking the antibiotic even if you start to feel better.  Hydrate and Humidify  Drink enough water to keep your urine clear or pale yellow. Staying hydrated will help to thin your mucus.  Use a cool mist humidifier to keep the humidity level in your home above 50%.  Inhale steam for 10-15 minutes, 3-4 times a day or as told by your health care provider. You can do this in the bathroom while a hot shower is running.  Limit your exposure to cool or dry air.  Rest  Rest as much as possible.  Sleep with your head raised (elevated).  Make sure to get enough sleep each night.  General instructions  Apply a warm, moist washcloth to your face 3-4 times a day or as told by your health care provider. This will help with discomfort.  Wash your hands often with soap and water to reduce your exposure to viruses and other germs. If soap and water are not available, use hand sanitizer.  Do not smoke. Avoid being around people who are smoking (secondhand smoke).  Keep all follow-up visits as told by your health care provider. This is important.  Contact a health care provider if:  You have a fever.  Your symptoms get worse.  Your symptoms do not improve within 10 days.  Get help right away if:  You have a severe headache.  You have persistent vomiting.  You have pain or swelling around your face or eyes.  You have vision problems.  You develop confusion.  Your neck is stiff.  You have trouble breathing.  This  information is not intended to replace advice given to you by your health care provider. Make sure you discuss any questions you have with your health care provider.  Document Released: 01/01/2005 Document Revised: 08/28/2015 Document Reviewed: 10/27/2014  Elsevier Interactive Patient Education  2018 [employer]  Acute Bronchitis, Adult  Acute bronchitis is sudden (acute) swelling of the air tubes (bronchi) in the lungs. Acute bronchitis causes these tubes to fill with mucus, which can make it hard to breathe. It can also cause coughing or wheezing.  In adults, acute bronchitis usually goes away within 2 weeks. A cough caused by bronchitis may last up to 3 weeks. Smoking, allergies, and asthma can make the condition worse. Repeated episodes of bronchitis   may cause further lung problems, such as chronic obstructive pulmonary disease (COPD).  What are the causes?  This condition can be caused by germs and by substances that irritate the lungs, including:   Cold and flu viruses. This condition is most often caused by the same virus that causes a cold.   Bacteria.   Exposure to tobacco smoke, dust, fumes, and air pollution.    What increases the risk?  This condition is more likely to develop in people who:   Have close contact with someone with acute bronchitis.   Are exposed to lung irritants, such as tobacco smoke, dust, fumes, and vapors.   Have a weak immune system.   Have a respiratory condition such as asthma.    What are the signs or symptoms?  Symptoms of this condition include:   A cough.   Coughing up clear, yellow, or green mucus.   Wheezing.   Chest congestion.   Shortness of breath.   A fever.   Body aches.   Chills.   A sore throat.    How is this diagnosed?  This condition is usually diagnosed with a physical exam. During the exam, your health care provider may order tests, such as chest X-rays, to rule out other conditions. He or she may also:   Test a sample of your mucus for  bacterial infection.   Check the level of oxygen in your blood. This is done to check for pneumonia.   Do a chest X-ray or lung function testing to rule out pneumonia and other conditions.   Perform blood tests.    Your health care provider will also ask about your symptoms and medical history.  How is this treated?  Most cases of acute bronchitis clear up over time without treatment. Your health care provider may recommend:   Drinking more fluids. Drinking more makes your mucus thinner, which may make it easier to breathe.   Taking a medicine for a fever or cough.   Taking an antibiotic medicine.   Using an inhaler to help improve shortness of breath and to control a cough.   Using a cool mist vaporizer or humidifier to make it easier to breathe.    Follow these instructions at home:  Medicines   Take over-the-counter and prescription medicines only as told by your health care provider.   If you were prescribed an antibiotic, take it as told by your health care provider. Do not stop taking the antibiotic even if you start to feel better.  General instructions   Get plenty of rest.   Drink enough fluids to keep your urine clear or pale yellow.   Avoid smoking and secondhand smoke. Exposure to cigarette smoke or irritating chemicals will make bronchitis worse. If you smoke and you need help quitting, ask your health care provider. Quitting smoking will help your lungs heal faster.   Use an inhaler, cool mist vaporizer, or humidifier as told by your health care provider.   Keep all follow-up visits as told by your health care provider. This is important.  How is this prevented?  To lower your risk of getting this condition again:   Wash your hands often with soap and water. If soap and water are not available, use hand sanitizer.   Avoid contact with people who have cold symptoms.   Try not to touch your hands to your mouth, nose, or eyes.   Make sure to get the flu shot every year.      Contact a  health care provider if:   Your symptoms do not improve in 2 weeks of treatment.  Get help right away if:   You cough up blood.   You have chest pain.   You have severe shortness of breath.   You become dehydrated.   You faint or keep feeling like you are going to faint.   You keep vomiting.   You have a severe headache.   Your fever or chills gets worse.  This information is not intended to replace advice given to you by your health care provider. Make sure you discuss any questions you have with your health care provider.  Document Released: 02/09/2004 Document Revised: 07/27/2015 Document Reviewed: 06/22/2015  Elsevier Interactive Patient Education  2018 [employer]

## 2017-03-26 ENCOUNTER — Ambulatory Visit: Payer: Self-pay | Admitting: Obstetrics and Gynecology

## 2017-04-03 ENCOUNTER — Ambulatory Visit: Payer: Self-pay | Admitting: Obstetrics and Gynecology

## 2017-04-30 ENCOUNTER — Ambulatory Visit
Admission: EM | Admit: 2017-04-30 | Discharge: 2017-04-30 | Disposition: A | Discharge status: Home / Self Care | Payer: Managed Care, Other (non HMO) | Attending: Family Medicine | Admitting: Family Medicine

## 2017-04-30 ENCOUNTER — Encounter: Payer: Self-pay | Admitting: Emergency Medicine

## 2017-04-30 ENCOUNTER — Telehealth: Payer: Self-pay

## 2017-04-30 ENCOUNTER — Other Ambulatory Visit: Payer: Self-pay

## 2017-04-30 ENCOUNTER — Ambulatory Visit: Payer: Self-pay | Admitting: *Deleted

## 2017-04-30 DIAGNOSIS — J0100 Acute maxillary sinusitis, unspecified: Secondary | ICD-10-CM

## 2017-04-30 DIAGNOSIS — H6981 Other specified disorders of Eustachian tube, right ear: Secondary | ICD-10-CM | POA: Diagnosis not present

## 2017-04-30 DIAGNOSIS — R42 Dizziness and giddiness: Secondary | ICD-10-CM

## 2017-04-30 MED ORDER — AMOXICILLIN 875 MG PO TABS: 875.0000 mg | ORAL_TABLET | Freq: Two times a day (BID) | ORAL | 0 refills | Status: DC

## 2017-04-30 MED ORDER — FLUTICASONE PROPIONATE 50 MCG/ACT NA SUSP: NASAL | 0 refills | Status: DC

## 2017-04-30 NOTE — ED Triage Notes (Signed)
Patient in today c/o dizziness off and on over the past month, worse today. Patient states her right ear fills full.

## 2017-04-30 NOTE — Telephone Encounter (Signed)
Pt  Not  An  Established  Patient  By  [facility] at  [facility]  Need  To  Be  Established  To  Make  An acute  Appointment  Pt states  She  Will  Go  To  An urgent care today  While  On telephone  Appointment  With Dr  Terese Door  In may  But pt will  Go to an urgent  Care  Today  Advised  Not to  Drive   Reason for Disposition . [1] MODERATE dizziness (e.g., vertigo; feels very unsteady, interferes with normal activities) AND [2] has been evaluated by physician for this  Answer Assessment - Initial Assessment Questions 1. DESCRIPTION: "Describe your dizziness."      Gets  Dizzy  Worse   When  She  Stands  Up    2. VERTIGO: "Do you feel like either you or the room is spinning or tilting?"         Yes   3. LIGHTHEADED: "Do you feel lightheaded?" (e.g., somewhat faint, woozy, weak upon standing)         Yes    Somewhat  Faint when  Stands    4. SEVERITY: "How bad is it?"  "Can you walk?"   - MILD - Feels unsteady but walking normally.   - MODERATE - Feels very unsteady when walking, but not falling; interferes with normal activities (e.g., school, work) .   - SEVERE - Unable to walk without falling (requires assistance).     Mild  - Moderate   5. ONSET:  "When did the dizziness begin?"       Couple  Of  Weeks  intermittant  Worse  This  Am   6. AGGRAVATING FACTORS: "Does anything make it worse?" (e.g., standing, change in head position)      Standing  Certain  Movements    7. CAUSE: "What do you think is causing the dizziness?"        Certain   8. RECURRENT SYMPTOM: "Have you had dizziness before?" If so, ask: "When was the last time?" "What happened that time?"         Fluid  Inner ear    5  Years    9. OTHER SYMPTOMS: "Do you have any other symptoms?" (e.g., headache, weakness, numbness, vomiting, earache)         Pressure  In ears   10. PREGNANCY: "Is there any chance you are pregnant?" "When was your last menstrual period?"  Hysterectomy  Protocols used: DIZZINESS - VERTIGO-A-AH

## 2017-04-30 NOTE — Telephone Encounter (Signed)
Received refill request for Vitamin D  Appt with new PCP: 06/14/17 Last Vit D Lab: 04/09/16

## 2017-04-30 NOTE — ED Provider Notes (Signed)
[facility]    CSN: [id number] Arrival date & time: 04/30/17  1201     History   Chief Complaint Chief Complaint  Patient presents with  . Dizziness    HPI Meghan Young is a 57 y.o. female.   The history is provided by the patient.  URI  Presenting symptoms: congestion and ear pain   Severity:  Moderate Onset quality:  Sudden Duration:  1 month Timing:  Constant Progression:  Worsening Chronicity:  New Relieved by:  Nothing Ineffective treatments:  OTC medications Associated symptoms: sinus pain   Associated symptoms: no wheezing   Associated symptoms comment:  Intermittent dizziness Risk factors: not elderly, no chronic cardiac disease, no chronic kidney disease, no chronic respiratory disease, no diabetes mellitus, no immunosuppression, no recent illness and no recent travel     Past Medical History:  Diagnosis Date  . Allergy   . Anxiety   . Depression   . GERD (gastroesophageal reflux disease)     Patient Active Problem List   Diagnosis Date Noted  . ANA positive 01/19/2013    Past Surgical History:  Procedure Laterality Date  . ABDOMINAL HYSTERECTOMY    . APPENDECTOMY    . CHOLECYSTECTOMY    . TONSILLECTOMY    . TUBAL LIGATION      OB History   None      Home Medications    Prior to Admission medications   Medication Sig Start Date End Date Taking? Authorizing Provider  albuterol (PROVENTIL HFA;VENTOLIN HFA) 108 (90 Base) MCG/ACT inhaler Inhale 2 puffs into the lungs every 6 (six) hours as needed for wheezing or shortness of breath. 09/07/16  Yes Fisher, Linden Dolin, PA-C  ALPRAZolam Duanne Moron) 1 MG tablet  08/16/16  Yes [provider]  DULoxetine (CYMBALTA) 60 MG capsule Take 60 mg by mouth daily. 02/13/16  Yes [provider]  furosemide (LASIX) 40 MG tablet  03/23/15  Yes [provider]  omeprazole (PRILOSEC) 40 MG capsule take 1 capsule by mouth once daily 10/22/16  Yes Fisher, Linden Dolin, PA-C  Vitamin D,  Ergocalciferol, (DRISDOL) 50000 units CAPS capsule Take 1 capsule (50,000 Units total) by mouth every 7 (seven) days. 04/11/16  Yes Fisher, Linden Dolin, PA-C  albuterol (PROVENTIL) (2.5 MG/3ML) 0.083% nebulizer solution Take 3 mLs (2.5 mg total) by nebulization every 6 (six) hours as needed for wheezing or shortness of breath. 06/16/15   Fisher, Linden Dolin, PA-C  amoxicillin (AMOXIL) 875 MG tablet Take 1 tablet (875 mg total) by mouth 2 (two) times daily. 04/30/17   Norval Gable, MD  azithromycin (ZITHROMAX Z-PAK) 250 MG tablet Take 2 tablets on day one, then 1 tablet daily on days 2 through 5 02/11/17   Jacqulyn Cane, MD  fluticasone [facility]) 50 MCG/ACT nasal spray 1 or 2 sprays each nostril twice a day 04/30/17   Norval Gable, MD  omeprazole (PRILOSEC) 20 MG capsule Take 20 mg by mouth.    [provider]  predniSONE (STERAPRED UNI-PAK 21 TAB) 10 MG (21) TBPK tablet Take as directed for 6 days.--Take 6 on day 1, 5 on day 2, 4 on day 3, then 3 tablets on day 4, then 2 tablets on day 5, then 1 on day 6. 02/11/17   Jacqulyn Cane, MD  promethazine-codeine [facility] WITH CODEINE) 6.25-10 MG/5ML syrup Take 1-2 teaspoons every 4-6 hours as needed for cough. May cause drowsiness. 02/11/17   Jacqulyn Cane, MD    Family History Family History  Problem Relation Age of  Onset  . Anxiety disorder Mother   . Raynaud syndrome Mother   . Heart disease Father     Social History Social History   Tobacco Use  . Smoking status: Former Smoker    Last attempt to quit: 04/30/2013    Years since quitting: 4.0  . Smokeless tobacco: Never Used  Substance Use Topics  . Alcohol use: Yes    Alcohol/week: 4.2 oz    Types: 7 Standard drinks or equivalent per week    Comment: daily  . Drug use: No     Allergies   Patient has no known allergies.   Review of Systems Review of Systems  HENT: Positive for congestion, ear pain and sinus pain.   Respiratory: Negative for wheezing.      Physical Exam Triage  Vital Signs ED Triage Vitals  Enc Vitals Group     BP 04/30/17 1215 (!) 138/44     Pulse Rate 04/30/17 1215 (!) 110     Resp 04/30/17 1215 16     Temp 04/30/17 1215 97.8 F (36.6 C)     Temp Source 04/30/17 1215 Oral     SpO2 04/30/17 1215 100 %     Weight 04/30/17 1216 235 lb (106.6 kg)     Height 04/30/17 1216 5\' 3"  (1.6 m)     Head Circumference --      Peak Flow --      Pain Score 04/30/17 1215 0     Pain Loc --      Pain Edu? --      Excl. in [place]? --    No data found.  Updated Vital Signs BP (!) 138/44 (BP Location: Left Arm)   Pulse (!) 110   Temp 97.8 F (36.6 C) (Oral)   Resp 16   Ht 5\' 3"  (1.6 m)   Wt 235 lb (106.6 kg)   SpO2 100%   BMI 41.63 kg/m   Visual Acuity Right Eye Distance:   Left Eye Distance:   Bilateral Distance:    Right Eye Near:   Left Eye Near:    Bilateral Near:     Physical Exam  Constitutional: She appears well-developed and well-nourished. No distress.  HENT:  Head: Normocephalic and atraumatic.  Right Ear: External ear and ear canal normal. A middle ear effusion is present.  Left Ear: Tympanic membrane, external ear and ear canal normal.  Nose: Mucosal edema and rhinorrhea present. No nose lacerations, sinus tenderness, nasal deformity, septal deviation or nasal septal hematoma. No epistaxis.  No foreign bodies. Right sinus exhibits maxillary sinus tenderness and frontal sinus tenderness. Left sinus exhibits maxillary sinus tenderness and frontal sinus tenderness.  Mouth/Throat: Uvula is midline, oropharynx is clear and moist and mucous membranes are normal. No oropharyngeal exudate.  Eyes: Pupils are equal, round, and reactive to light. Conjunctivae and EOM are normal. Right eye exhibits no discharge. Left eye exhibits no discharge. No scleral icterus.  Neck: Normal range of motion. Neck supple. No thyromegaly present.  Cardiovascular: Normal rate, regular rhythm and normal heart sounds.  Pulmonary/Chest: Effort normal and breath  sounds normal. No respiratory distress. She has no wheezes. She has no rales.  Lymphadenopathy:    She has no cervical adenopathy.  Skin: She is not diaphoretic.  Nursing note and vitals reviewed.    UC Treatments / Results  Labs (all labs ordered are listed, but only abnormal results are displayed) Labs Reviewed - No data to display  EKG None Radiology No results found.  Procedures Procedures (including critical care time)  Medications Ordered in UC Medications - No data to display   Initial Impression / Assessment and Plan / UC Course  I have reviewed the triage vital signs and the nursing notes.  Pertinent labs & imaging results that were available during my care of the patient were reviewed by me and considered in my medical decision making (see chart for details).      Final Clinical Impressions(s) / UC Diagnoses   Final diagnoses:  Acute maxillary sinusitis, recurrence not specified  Dizziness  Dysfunction of right eustachian tube    ED Discharge Orders        Ordered    amoxicillin (AMOXIL) 875 MG tablet  2 times daily     04/30/17 1252    fluticasone (FLONASE) 50 MCG/ACT nasal spray     04/30/17 1314     1. diagnosis reviewed with patient 2. rx as per orders above; reviewed possible side effects, interactions, risks and benefits  3. Recommend supportive treatment with antihistamine/decongestant, flonase 4. Follow-up prn if symptoms worsen or don't improve  Controlled Substance Prescriptions [place] Controlled Substance Registry consulted? Not Applicable   Norval Gable, MD 04/30/17 1327

## 2017-04-30 NOTE — Discharge Instructions (Signed)
Over the counter zyrtec or allegra Flonase nasal spray

## 2017-05-01 NOTE — Telephone Encounter (Signed)
This needs to be monitored/prescribed by the patient's primary care provider.  I see that she has an upcoming appointment with Dr. Terese Door, who can check her vitamin D level and refill this if warranted or I am happy to do this in the interim if she has not yet established care with Dr. Terese Door and is in need of this.

## 2017-05-07 ENCOUNTER — Ambulatory Visit: Payer: Self-pay | Admitting: Obstetrics and Gynecology

## 2017-05-20 ENCOUNTER — Ambulatory Visit: Payer: Self-pay | Admitting: Obstetrics and Gynecology

## 2017-06-04 ENCOUNTER — Other Ambulatory Visit: Payer: Self-pay | Admitting: Obstetrics and Gynecology

## 2017-06-05 ENCOUNTER — Encounter: Payer: Self-pay | Admitting: Obstetrics and Gynecology

## 2017-06-05 ENCOUNTER — Ambulatory Visit (INDEPENDENT_AMBULATORY_CARE_PROVIDER_SITE_OTHER): Payer: Managed Care, Other (non HMO) | Admitting: Obstetrics and Gynecology

## 2017-06-05 VITALS — BP 132/88 | HR 115 | Ht 63.5 in | Wt 238.5 lb

## 2017-06-05 DIAGNOSIS — Z124 Encounter for screening for malignant neoplasm of cervix: Secondary | ICD-10-CM | POA: Diagnosis not present

## 2017-06-05 DIAGNOSIS — K625 Hemorrhage of anus and rectum: Secondary | ICD-10-CM | POA: Diagnosis not present

## 2017-06-05 DIAGNOSIS — Z1151 Encounter for screening for human papillomavirus (HPV): Secondary | ICD-10-CM | POA: Diagnosis not present

## 2017-06-05 DIAGNOSIS — Z1231 Encounter for screening mammogram for malignant neoplasm of breast: Secondary | ICD-10-CM | POA: Diagnosis not present

## 2017-06-05 DIAGNOSIS — R195 Other fecal abnormalities: Secondary | ICD-10-CM | POA: Diagnosis not present

## 2017-06-05 DIAGNOSIS — Z1239 Encounter for other screening for malignant neoplasm of breast: Secondary | ICD-10-CM

## 2017-06-05 DIAGNOSIS — Z01419 Encounter for gynecological examination (general) (routine) without abnormal findings: Secondary | ICD-10-CM

## 2017-06-05 DIAGNOSIS — Z1211 Encounter for screening for malignant neoplasm of colon: Secondary | ICD-10-CM | POA: Diagnosis not present

## 2017-06-05 LAB — HEMOCCULT GUIAC POC 1CARD (OFFICE): FECAL OCCULT BLD: POSITIVE — AB

## 2017-06-05 NOTE — Patient Instructions (Addendum)
I value your feedback and entrusting us with your care. If you get a  patient survey, I would appreciate you taking the time to let us know about your experience today. Thank you!  [facility]: [phone]  [facility]: [phone]  

## 2017-06-05 NOTE — Progress Notes (Signed)
PCP: Patient, No Pcp Per   Chief Complaint  Patient presents with  . Gynecologic Exam    HPI:      Meghan Young is a 57 y.o. No obstetric history on file. who LMP was No LMP recorded. Patient has had a hysterectomy., presents today for her annual examination.  Her menses are absent due to hyst due to uterine prolapse. She does not have intermenstrual bleeding. She does have vasomotor sx, somewhat improved with celexa.   Sex activity: not sexually active. She does have vaginal dryness.  Last Pap: years ago  Hx of STDs: none  Last mammogram: Jun 04, 2011  Results were: normal--routine follow-up in 12 months There is no FH of breast cancer. There is no FH of ovarian cancer. The patient does do self-breast exams.  Colonoscopy:never. Pt does have BRBPR with wiping occas, usually after a hard part of a stool. She has a hx of anal fissures in the past. Area is sometimes painful. Had episiotomy yrs ago and thinks it's related.  Tobacco use: The patient denies current or previous tobacco use. Alcohol use: glass of wine with dinner Exercise: not active  She does not get adequate calcium and Vitamin D in her diet.  Labs at work, but has appt with PCP 06/14/17. Has issues with vertigo/dizziness, depression, GERD.    Past Medical History:  Diagnosis Date  . Allergy   . Anxiety   . Depression   . GERD (gastroesophageal reflux disease)     Past Surgical History:  Procedure Laterality Date  . APPENDECTOMY    . CATARACT EXTRACTION Right 01/23/2017  . CHOLECYSTECTOMY    . TONSILLECTOMY    . TOTAL VAGINAL HYSTERECTOMY  2002   due to uterine prolapse  . TUBAL LIGATION      Family History  Problem Relation Age of Onset  . Anxiety disorder Mother   . Raynaud syndrome Mother   . Heart disease Father     Social History   Socioeconomic History  . Marital status: Married    Spouse name: Not on file  . Number of children: Not on file  . Years of education: Not on file  .  Highest education level: Not on file  Occupational History  . Not on file  Social Needs  . Financial resource strain: Not on file  . Food insecurity:    Worry: Not on file    Inability: Not on file  . Transportation needs:    Medical: Not on file    Non-medical: Not on file  Tobacco Use  . Smoking status: Former Smoker    Last attempt to quit: 04/30/2013    Years since quitting: 4.1  . Smokeless tobacco: Never Used  Substance and Sexual Activity  . Alcohol use: Yes    Alcohol/week: 4.2 oz    Types: 7 Standard drinks or equivalent per week    Comment: daily  . Drug use: No  . Sexual activity: Yes    Birth control/protection: Surgical  Lifestyle  . Physical activity:    Days per week: Not on file    Minutes per session: Not on file  . Stress: Not on file  Relationships  . Social connections:    Talks on phone: Not on file    Gets together: Not on file    Attends religious service: Not on file    Active member of club or organization: Not on file    Attends meetings of clubs or organizations: Not on  file    Relationship status: Not on file  . Intimate partner violence:    Fear of current or ex partner: Not on file    Emotionally abused: Not on file    Physically abused: Not on file    Forced sexual activity: Not on file  Other Topics Concern  . Not on file  Social History Narrative  . Not on file    Outpatient Medications Prior to Visit  Medication Sig Dispense Refill  . ALPRAZolam (XANAX) 1 MG tablet   0  . DULoxetine (CYMBALTA) 60 MG capsule Take 60 mg by mouth daily.  0  . fluticasone (FLONASE) 50 MCG/ACT nasal spray 1 or 2 sprays each nostril twice a day 16 g 0  . furosemide (LASIX) 40 MG tablet   1  . omeprazole (PRILOSEC) 40 MG capsule take 1 capsule by mouth once daily 30 capsule 12  . Vitamin D, Ergocalciferol, (DRISDOL) 50000 units CAPS capsule Take 1 capsule (50,000 Units total) by mouth every 7 (seven) days. 12 capsule 3  . albuterol (PROVENTIL  HFA;VENTOLIN HFA) 108 (90 Base) MCG/ACT inhaler Inhale 2 puffs into the lungs every 6 (six) hours as needed for wheezing or shortness of breath. (Patient not taking: Reported on 06/05/2017) 1 Inhaler 0  . albuterol (PROVENTIL) (2.5 MG/3ML) 0.083% nebulizer solution Take 3 mLs (2.5 mg total) by nebulization every 6 (six) hours as needed for wheezing or shortness of breath. (Patient not taking: Reported on 06/05/2017) 75 mL 12  . amoxicillin (AMOXIL) 875 MG tablet Take 1 tablet (875 mg total) by mouth 2 (two) times daily. (Patient not taking: Reported on 06/05/2017) 20 tablet 0  . azithromycin (ZITHROMAX Z-PAK) 250 MG tablet Take 2 tablets on day one, then 1 tablet daily on days 2 through 5 (Patient not taking: Reported on 06/05/2017) 1 each 0  . omeprazole (PRILOSEC) 20 MG capsule Take 20 mg by mouth.    . predniSONE (STERAPRED UNI-PAK 21 TAB) 10 MG (21) TBPK tablet Take as directed for 6 days.--Take 6 on day 1, 5 on day 2, 4 on day 3, then 3 tablets on day 4, then 2 tablets on day 5, then 1 on day 6. (Patient not taking: Reported on 06/05/2017) 21 tablet 0  . promethazine-codeine (PHENERGAN WITH CODEINE) 6.25-10 MG/5ML syrup Take 1-2 teaspoons every 4-6 hours as needed for cough. May cause drowsiness. (Patient not taking: Reported on 06/05/2017) 120 mL 0   No facility-administered medications prior to visit.       ROS:  Review of Systems  Constitutional: Positive for fatigue. Negative for fever and unexpected weight change.  Respiratory: Negative for cough, shortness of breath and wheezing.   Cardiovascular: Negative for chest pain, palpitations and leg swelling.  Gastrointestinal: Negative for blood in stool, constipation, diarrhea, nausea and vomiting.  Endocrine: Negative for cold intolerance, heat intolerance and polyuria.  Genitourinary: Negative for dyspareunia, dysuria, flank pain, frequency, genital sores, hematuria, menstrual problem, pelvic pain, urgency, vaginal bleeding, vaginal discharge  and vaginal pain.  Musculoskeletal: Negative for back pain, joint swelling and myalgias.  Skin: Negative for rash.  Neurological: Positive for dizziness. Negative for syncope, light-headedness, numbness and headaches.  Hematological: Negative for adenopathy.  Psychiatric/Behavioral: Positive for dysphoric mood. Negative for agitation, confusion, sleep disturbance and suicidal ideas. The patient is not nervous/anxious.   BREAST: No symptoms   Objective: BP 132/88   Pulse (!) 115   Ht 5' 3.5" (1.613 m)   Wt 238 lb 8 oz (108.2 kg)  BMI 41.59 kg/m    Physical Exam  Constitutional: She is oriented to person, place, and time. She appears well-developed and well-nourished.  Genitourinary: Vagina normal. There is no rash or tenderness on the right labia. There is no rash or tenderness on the left labia. No erythema or tenderness in the vagina. No vaginal discharge found. Right adnexum does not display mass and does not display tenderness. Left adnexum does not display mass and does not display tenderness. Rectal exam shows tenderness and guaiac positive stool. Rectal exam shows no external hemorrhoid, no fissure and no mass.  Genitourinary Comments: UTERUS/CX SURG REM  Neck: Normal range of motion. No thyromegaly present.  Cardiovascular: Normal rate, regular rhythm and normal heart sounds.  No murmur heard. Pulmonary/Chest: Effort normal and breath sounds normal. Right breast exhibits no mass, no nipple discharge, no skin change and no tenderness. Left breast exhibits no mass, no nipple discharge, no skin change and no tenderness.  Abdominal: Soft. There is no tenderness. There is no guarding.  Musculoskeletal: Normal range of motion.  Neurological: She is alert and oriented to person, place, and time. No cranial nerve deficit.  Psychiatric: She has a normal mood and affect. Her behavior is normal.  Vitals reviewed.   Results: Results for orders placed or performed in visit on 06/05/17  (from the past 24 hour(s))  POCT Occult Blood Stool     Status: Abnormal   Collection Time: 06/05/17  9:00 AM  Result Value Ref Range   Fecal Occult Blood, POC Positive (A) Negative   Card #1 Date     Card #2 Fecal Occult Blod, POC     Card #2 Date     Card #3 Fecal Occult Blood, POC     Card #3 Date      Assessment/Plan:  Encounter for annual routine gynecological examination  Cervical cancer screening - Plan: IGP, Aptima HPV  Screening for HPV (human papillomavirus) - Plan: IGP, Aptima HPV  Screening for breast cancer - Pt to sched mammo. - Plan: MM DIGITAL SCREENING BILATERAL  Screening for colon cancer - Refer for colonoscopy due to age/rectal bleeding/hx of anal fissure.  - Plan: POCT Occult Blood Stool, Ambulatory referral to Gastroenterology  Guaiac positive stools  Rectal bleeding         GYN counsel breast self exam, mammography screening, menopause, adequate intake of calcium and vitamin D, diet and exercise    F/U  Return in about 1 year (around 06/06/2018).  Alicia B. Copland, PA-C 06/05/2017 9:00 AM

## 2017-06-08 LAB — IGP, APTIMA HPV
HPV APTIMA: NEGATIVE
PAP SMEAR COMMENT: 0

## 2017-06-14 ENCOUNTER — Ambulatory Visit: Payer: Managed Care, Other (non HMO) | Admitting: Internal Medicine

## 2017-06-14 ENCOUNTER — Encounter: Payer: Self-pay | Admitting: Internal Medicine

## 2017-06-14 VITALS — BP 136/84 | HR 102 | Temp 98.4°F | Ht 63.5 in | Wt 240.2 lb

## 2017-06-14 DIAGNOSIS — Z0000 Encounter for general adult medical examination without abnormal findings: Secondary | ICD-10-CM | POA: Diagnosis not present

## 2017-06-14 DIAGNOSIS — R600 Localized edema: Secondary | ICD-10-CM

## 2017-06-14 DIAGNOSIS — Z1389 Encounter for screening for other disorder: Secondary | ICD-10-CM

## 2017-06-14 DIAGNOSIS — Z1329 Encounter for screening for other suspected endocrine disorder: Secondary | ICD-10-CM

## 2017-06-14 DIAGNOSIS — E559 Vitamin D deficiency, unspecified: Secondary | ICD-10-CM | POA: Diagnosis not present

## 2017-06-14 DIAGNOSIS — R002 Palpitations: Secondary | ICD-10-CM

## 2017-06-14 DIAGNOSIS — F419 Anxiety disorder, unspecified: Secondary | ICD-10-CM | POA: Diagnosis not present

## 2017-06-14 DIAGNOSIS — K219 Gastro-esophageal reflux disease without esophagitis: Secondary | ICD-10-CM

## 2017-06-14 DIAGNOSIS — R609 Edema, unspecified: Secondary | ICD-10-CM

## 2017-06-14 DIAGNOSIS — J309 Allergic rhinitis, unspecified: Secondary | ICD-10-CM

## 2017-06-14 MED ORDER — FUROSEMIDE 40 MG PO TABS: 40.0000 mg | ORAL_TABLET | Freq: Every day | ORAL | 1 refills | Status: DC

## 2017-06-14 MED ORDER — FLUTICASONE PROPIONATE 50 MCG/ACT NA SUSP: NASAL | 11 refills | Status: AC

## 2017-06-14 MED ORDER — OMEPRAZOLE 40 MG PO CPDR: 40.0000 mg | DELAYED_RELEASE_CAPSULE | Freq: Every day | ORAL | 1 refills | Status: DC

## 2017-06-14 NOTE — Progress Notes (Addendum)
Chief Complaint  Patient presents with  . New Patient (Initial Visit)   New patient  1. Would like refills of flonase, lasix and prilosec  2. H/o red blood in stool pending appt with GI  3. Anxiety >depression she does not know if cymbalta 60 mg is helping has been on paxil in the past and lexapro. Not currently in therapy  4. Dizziness went to [facility] urgent care and had fluid in her ears. She has ENT Dr. Hervey Ard in [place]  5. C/o hot flashes    Review of Systems  Constitutional: Negative for weight loss.  HENT: Negative for hearing loss.        +fluid in ears   Eyes: Negative for blurred vision.  Respiratory: Negative for shortness of breath.   Cardiovascular: Negative for chest pain.  Gastrointestinal: Positive for heartburn.  Musculoskeletal: Negative for falls.  Skin: Negative for rash.  Neurological: Negative for headaches.  Endo/Heme/Allergies:       +hot flashes   Psychiatric/Behavioral: Negative for depression. The patient is nervous/anxious.    Past Medical History:  Diagnosis Date  . Allergy   . Anxiety   . Blood in stool   . Chicken pox   . Depression   . GERD (gastroesophageal reflux disease)   . Papilloma of nose    with h/o nose spur   . UTI (urinary tract infection)    Past Surgical History:  Procedure Laterality Date  . ABDOMINAL HYSTERECTOMY     partial   . APPENDECTOMY    . CATARACT EXTRACTION Right 01/23/2017  . cataract surgery     01/2017 [place]  . CHOLECYSTECTOMY    . TONSILLECTOMY    . TONSILLECTOMY    . TOTAL VAGINAL HYSTERECTOMY  2002   due to uterine prolapse  . TUBAL LIGATION     Family History  Problem Relation Age of Onset  . Raynaud syndrome Mother   . Arthritis Mother   . Depression Mother   . Anxiety disorder Mother   . Early death Father        age 19? reason  . Depression Daughter   . Hypertension Son   . Arthritis Maternal Grandmother   . Hypertension Maternal Grandmother   . Miscarriages / Stillbirths Maternal  Grandmother   . Depression Maternal Grandfather   . Drug abuse Maternal Grandfather   . Early death Maternal Grandfather   . Heart disease Maternal Grandfather        tachycardia   . Arthritis Paternal Grandmother   . Hypertension Paternal Grandmother   . Early death Paternal Grandfather   . Breast cancer Neg Hx    Social History   Socioeconomic History  . Marital status: Married    Spouse name: Not on file  . Number of children: Not on file  . Years of education: Not on file  . Highest education level: Not on file  Occupational History  . Not on file  Social Needs  . Financial resource strain: Not on file  . Food insecurity:    Worry: Not on file    Inability: Not on file  . Transportation needs:    Medical: Not on file    Non-medical: Not on file  Tobacco Use  . Smoking status: Former Smoker    Last attempt to quit: 04/30/2013    Years since quitting: 4.1  . Smokeless tobacco: Never Used  Substance and Sexual Activity  . Alcohol use: Yes    Alcohol/week: 4.2 oz  Types: 7 Standard drinks or equivalent per week    Comment: daily  . Drug use: No  . Sexual activity: Yes    Birth control/protection: Surgical  Lifestyle  . Physical activity:    Days per week: Not on file    Minutes per session: Not on file  . Stress: Not on file  Relationships  . Social connections:    Talks on phone: Not on file    Gets together: Not on file    Attends religious service: Not on file    Active member of club or organization: Not on file    Attends meetings of clubs or organizations: Not on file    Relationship status: Not on file  . Intimate partner violence:    Fear of current or ex partner: Not on file    Emotionally abused: Not on file    Physically abused: Not on file    Forced sexual activity: Not on file  Other Topics Concern  . 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 albuterol (PROVENTIL HFA;VENTOLIN HFA) 108 (90 Base) MCG/ACT inhaler Inhale 2 puffs into the lungs every 6 (six) hours as needed for wheezing or shortness of breath.  Marland Kitchen albuterol (PROVENTIL) (2.5 MG/3ML) 0.083% nebulizer solution Take 3 mLs (2.5 mg total) by nebulization every 6 (six) hours as needed for wheezing or shortness of breath.  . ALPRAZolam (XANAX) 1 MG tablet   . clonazePAM (KLONOPIN) 0.5 MG tablet Take 0.5 mg by mouth 3 (three) times daily as needed for anxiety (on 0.5 bid prn and 1 mg qhs per pt per psych).  . DULoxetine (CYMBALTA) 60 MG capsule Take 60 mg by mouth daily.  . fluticasone (FLONASE) 50 MCG/ACT nasal spray 1 or 2 sprays each nostril twice a day  . furosemide (LASIX) 40 MG tablet Take 1 tablet (40 mg total) by mouth daily. 30 minutes before food  . omeprazole (PRILOSEC) 40 MG capsule Take 1 capsule (40 mg total) by mouth daily.  . Vitamin D, Ergocalciferol, (DRISDOL) 50000 units CAPS capsule Take 1 capsule (50,000 Units total) by mouth every 7 (seven) days.  . [DISCONTINUED] fluticasone (FLONASE) 50 MCG/ACT nasal spray 1 or 2 sprays each nostril twice a day  . [DISCONTINUED] furosemide (LASIX) 40 MG tablet   . [DISCONTINUED] omeprazole (PRILOSEC) 40 MG capsule take 1 capsule by mouth once daily   No Known Allergies Recent Results (from the past 2160 hour(s))  IGP, Aptima HPV     Status: None   Collection Time: 06/05/17  8:22 AM  Result Value Ref Range   DIAGNOSIS: Comment     Comment: NEGATIVE FOR INTRAEPITHELIAL LESION OR MALIGNANCY.   Specimen adequacy: Comment     Comment: Satisfactory for evaluation. No endocervical component is identified.   Clinician Provided ICD10 Comment     Comment: Z12.4 Z11.51    Performed by: Comment     Comment: Pat Patrick, Cytotechnologist (ASCP)   PAP Smear Comment .    Note: Comment     Comment: The Pap smear is a  screening test designed to aid in the detection of premalignant and malignant conditions of the uterine cervix.  It is not a diagnostic procedure and should not be  used as the sole means of detecting cervical cancer.  Both false-positive and false-negative reports do occur.    Test Methodology Comment     Comment: This liquid based ThinPrep(R) pap test was screened with the use of an image guided system.    HPV Aptima Negative Negative    Comment: This test detects fourteen high-risk HPV types (16/18/31/33/35/39/45/ 51/52/56/58/59/66/68) without differentiation.   POCT Occult Blood Stool     Status: Abnormal   Collection Time: 06/05/17  9:00 AM  Result Value Ref Range   Fecal Occult Blood, POC Positive (A) Negative   Card #1 Date     Card #2 Fecal Occult Blod, POC     Card #2 Date     Card #3 Fecal Occult Blood, POC     Card #3 Date    Comprehensive metabolic panel     Status: Abnormal   Collection Time: 06/14/17  3:33 PM  Result Value Ref Range   Glucose, Bld 122 (H) 65 - 99 mg/dL    Comment: .            Fasting reference interval . For someone without known diabetes, a glucose value between 100 and 125 mg/dL is consistent with prediabetes and should be confirmed with a follow-up test. .    BUN 17 7 - 25 mg/dL   Creat 0.85 0.50 - 1.05 mg/dL    Comment: For patients >76 years of age, the reference limit for Creatinine is approximately 13% higher for people identified as African-American. .    BUN/Creatinine Ratio NOT APPLICABLE 6 - 22 (calc)   Sodium 140 135 - 146 mmol/L   Potassium 3.6 3.5 - 5.3 mmol/L   Chloride 99 98 - 110 mmol/L   CO2 31 20 - 32 mmol/L   Calcium 9.5 8.6 - 10.4 mg/dL   Total Protein 7.3 6.1 - 8.1 g/dL   Albumin 4.7 3.6 - 5.1 g/dL   Globulin 2.6 1.9 - 3.7 g/dL (calc)   AG Ratio 1.8 1.0 - 2.5 (calc)   Total Bilirubin 0.4 0.2 - 1.2 mg/dL   Alkaline phosphatase (APISO) 127 33 - 130 U/L   AST 47 (H) 10 - 35 U/L   ALT 75 (H) 6 - 29 U/L  CBC with  Differential/Platelet     Status: None   Collection Time: 06/14/17  3:33 PM  Result Value Ref Range   WBC 9.8 3.8 - 10.8 Thousand/uL   RBC 4.46 3.80 - 5.10 Million/uL   Hemoglobin 13.9 11.7 - 15.5 g/dL   HCT 39.0 35.0 - 45.0 %   MCV 87.4 80.0 - 100.0 fL   MCH 31.2 27.0 - 33.0 pg   MCHC 35.6 32.0 - 36.0 g/dL   RDW 13.2 11.0 - 15.0 %   Platelets 319 140 - 400 Thousand/uL   MPV 10.7 7.5 - 12.5 fL   Neutro Abs 6,380 1,500 - 7,800 cells/uL   Lymphs Abs 2,636 850 - 3,900 cells/uL   WBC mixed population 617 200 - 950 cells/uL   Eosinophils Absolute 127 15 - 500 cells/uL   Basophils Absolute 39 0 - 200 cells/uL   Neutrophils Relative % 65.1 %   Total Lymphocyte 26.9 %   Monocytes Relative 6.3 %   Eosinophils Relative 1.3 %   Basophils Relative 0.4 %  TSH     Status: None   Collection Time: 06/14/17  3:33 PM  Result Value Ref Range   TSH 3.01 mIU/L    Comment:  Reference Range .           > or = 20 Years  0.40-4.50 .                Pregnancy Ranges           First trimester    0.26-2.66           Second trimester   0.55-2.73           Third trimester    0.43-2.91   T4, free     Status: None   Collection Time: 06/14/17  3:33 PM  Result Value Ref Range   Free T4 0.9 0.8 - 1.8 ng/dL  VITAMIN D 25 Hydroxy (Vit-D Deficiency, Fractures)     Status: Abnormal   Collection Time: 06/14/17  3:33 PM  Result Value Ref Range   Vit D, 25-Hydroxy 22 (L) 30 - 100 ng/mL    Comment: Vitamin D Status         25-OH Vitamin D: . Deficiency:                    <20 ng/mL Insufficiency:             20 - 29 ng/mL Optimal:                 > or = 30 ng/mL . For 25-OH Vitamin D testing on patients on  D2-supplementation and patients for whom quantitation  of D2 and D3 fractions is required, the QuestAssureD(TM) 25-OH VIT D, (D2,D3), LC/MS/MS is recommended: order  code 92888 (patients >2yrs). . For more information on this test, go  to: http://education.questdiagnostics.com/faq/FAQ163 (This link is being provided for  informational/educational purposes only.)    Objective  Body mass index is 41.88 kg/m. Wt Readings from Last 3 Encounters:  06/14/17 240 lb 3.2 oz (109 kg)  06/05/17 238 lb 8 oz (108.2 kg)  04/30/17 235 lb (106.6 kg)   Temp Readings from Last 3 Encounters:  06/14/17 98.4 F (36.9 C) (Oral)  04/30/17 97.8 F (36.6 C) (Oral)  02/11/17 98.3 F (36.8 C) (Oral)   BP Readings from Last 3 Encounters:  06/14/17 136/84  06/05/17 132/88  04/30/17 (!) 138/44   Pulse Readings from Last 3 Encounters:  06/14/17 (!) 102  06/05/17 (!) 115  04/30/17 (!) 110    Physical Exam  Constitutional: She is oriented to person, place, and time. Vital signs are normal. She appears well-developed and well-nourished. She is cooperative.  HENT:  Head: Normocephalic and atraumatic.  Mouth/Throat: Oropharynx is clear and moist and mucous membranes are normal.  Eyes: Pupils are equal, round, and reactive to light. Conjunctivae are normal.  Cardiovascular: Normal rate, regular rhythm and normal heart sounds.  Pulmonary/Chest: Effort normal and breath sounds normal.  Neurological: She is alert and oriented to person, place, and time. Gait normal.  Skin: Skin is warm, dry and intact.  Psychiatric: She has a normal mood and affect. Her speech is normal and behavior is normal. Judgment and thought content normal. Cognition and memory are normal.  Talkative    Nursing note and vitals reviewed.   Assessment   1. Anxiety> depression but h/o postpartum depression  2. Edema legs and hands  3. Palpitations/pvcs could be related to #1  4. Allergic rhinitis with mild fluid in b/l ears  5. GERD 6. HM  Plan   1. rec f/u psych Dr. Plovsky and disc Effexor XL due to also having hot flashes would need   to dc cymbalta if changed  On klonopin 0.5 bid prn and 1 mg qhs per psych she has been on benzos since 1999 She has been on  paxil in the past caused weight gain and lexapro no hlep  rec she disc therapy with psych  2. Refilled lasix Consider echo in future   3. If not resolving will need to see cards  4. rec antihistamine with flonase  5. Refilled ppi  6.  Labs today   Had flu shot 2018  Tdap had 2018 per pt Disc shingrix at f/u  Per pt had HIV testing for work to bring in record of A1C and lipid check   mammo sch 06/17/17  Pap had 06/05/17 [facility] neg pap neg HPV Pending GI appt for blood in stool [facility]  Former smoker consider CT chest h/o nodular densities prior CT chest needs f/u  Consider derm in future  Hep C and HIV neg Hep B and MMR titers had health dept  -confirmed had varicella and MMR titers and immune   Reviewed biometrics info from work though date unkown BMI 39.1 Wt 235 Waist 47 BP 134/76  Total cholesterol 170 HDL 98  TG 149?, LDL 129 ? This is unclear  Fasting glucose 116   Reviewed vaccines  Had hep B vaccines Flu had 10/24/16  Td had 09/04/16/Tenivac  Tdap had 08/30/06  Adacel Per vaccine records had immunity to varicella and MMR immunity   Eye MD Dr. Erin Jackson [facility]  Dentist [facility]  Former PCP Dr. Bliss had physical 05/2016 [facility]  Psych is Dr. Plovsky [place] Provider: Dr. Tracy McLean-Scocuzza-Internal Medicine  

## 2017-06-14 NOTE — Progress Notes (Signed)
Pre visit review using our clinic review tool, if applicable. No additional management support is needed unless otherwise documented below in the visit note. 

## 2017-06-14 NOTE — Patient Instructions (Addendum)
Apps for anxiety insight timer, calm, headspace  Disc  With psychiatry changing to Effexor XL which may help with hot flashes mood and anxiety  Try Flonase and antihistamine (zyrtec, claritin, allegra) for your ears fluid in the ears  -some times D formulations for 3 days or less   I need copy of your vaccines and cholesterol and A1C     Debrox/Carbamide Peroxide ear solution What is this medicine? CARBAMIDE PEROXIDE (CAR bah mide per OX ide) is used to soften and help remove ear wax. This medicine may be used for other purposes; ask your health care provider or pharmacist if you have questions. COMMON BRAND NAME(S): Auro Ear, Auro Earache Relief, Debrox, Ear Drops, Ear Wax Removal, Ear Wax Remover, Earwax Treatment, Murine, Thera-Ear What should I tell my health care provider before I take this medicine? They need to know if you have any of these conditions: -dizziness -ear discharge -ear pain, irritation or rash -infection -perforated eardrum (hole in eardrum) -an unusual or allergic reaction to carbamide peroxide, glycerin, hydrogen peroxide, other medicines, foods, dyes, or preservatives -pregnant or trying to get pregnant -breast-feeding How should I use this medicine? This medicine is only for use in the outer ear canal. Follow the directions carefully. Wash hands before and after use. The solution may be warmed by holding the bottle in the hand for 1 to 2 minutes. Lie with the affected ear facing upward. Place the proper number of drops into the ear canal. After the drops are instilled, remain lying with the affected ear upward for 5 minutes to help the drops stay in the ear canal. A cotton ball may be gently inserted at the ear opening for no longer than 5 to 10 minutes to ensure retention. Repeat, if necessary, for the opposite ear. Do not touch the tip of the dropper to the ear, fingertips, or other surface. Do not rinse the dropper after use. Keep container tightly closed. Talk  to your pediatrician regarding the use of this medicine in children. While this drug may be used in children as young as 12 years for selected conditions, precautions do apply. Overdosage: If you think you have taken too much of this medicine contact a poison control center or emergency room at once. NOTE: This medicine is only for you. Do not share this medicine with others. What if I miss a dose? If you miss a dose, use it as soon as you can. If it is almost time for your next dose, use only that dose. Do not use double or extra doses. What may interact with this medicine? Interactions are not expected. Do not use any other ear products without asking your doctor or health care professional. This list may not describe all possible interactions. Give your health care provider a list of all the medicines, herbs, non-prescription drugs, or dietary supplements you use. Also tell them if you smoke, drink alcohol, or use illegal drugs. Some items may interact with your medicine. What should I watch for while using this medicine? This medicine is not for long-term use. Do not use for more than 4 days without checking with your health care professional. Contact your doctor or health care professional if your condition does not start to get better within a few days or if you notice burning, redness, itching or swelling. What side effects may I notice from receiving this medicine? Side effects that you should report to your doctor or health care professional as soon as possible: -allergic reactions like  skin rash, itching or hives, swelling of the face, lips, or tongue -burning, itching, and redness -worsening ear pain -rash Side effects that usually do not require medical attention (report to your doctor or health care professional if they continue or are bothersome): -abnormal sensation while putting the drops in the ear -temporary reduction in hearing (but not complete loss of hearing) This list may  not describe all possible side effects. Call your doctor for medical advice about side effects. You may report side effects to FDA at 1-800-FDA-1088. Where should I keep my medicine? Keep out of the reach of children. Store at room temperature between 15 and 30 degrees C (59 and 86 degrees F) in a tight, light-resistant container. Keep bottle away from excessive heat and direct sunlight. Throw away any unused medicine after the expiration date. NOTE: This sheet is a summary. It may not cover all possible information. If you have questions about this medicine, talk to your doctor, pharmacist, or health care provider.  2018 Elsevier/Gold Standard (2007-04-15 14:00:02)  Dizziness Dizziness is a common problem. It is a feeling of unsteadiness or light-headedness. You may feel like you are about to faint. Dizziness can lead to injury if you stumble or fall. Anyone can become dizzy, but dizziness is more common in older adults. This condition can be caused by a number of things, including medicines, dehydration, or illness. Follow these instructions at home: Eating and drinking  Drink enough fluid to keep your urine clear or pale yellow. This helps to keep you from becoming dehydrated. Try to drink more clear fluids, such as water.  Do not drink alcohol.  Limit your caffeine intake if told to do so by your health care provider. Check ingredients and nutrition facts to see if a food or beverage contains caffeine.  Limit your salt (sodium) intake if told to do so by your health care provider. Check ingredients and nutrition facts to see if a food or beverage contains sodium. Activity  Avoid making quick movements. ? Rise slowly from chairs and steady yourself until you feel okay. ? In the morning, first sit up on the side of the bed. When you feel okay, stand slowly while you hold onto something until you know that your balance is fine.  If you need to stand in one place for a long time, move your  legs often. Tighten and relax the muscles in your legs while you are standing.  Do not drive or use heavy machinery if you feel dizzy.  Avoid bending down if you feel dizzy. Place items in your home so that they are easy for you to reach without leaning over. Lifestyle  Do not use any products that contain nicotine or tobacco, such as cigarettes and e-cigarettes. If you need help quitting, ask your health care provider.  Try to reduce your stress level by using methods such as yoga or meditation. Talk with your health care provider if you need help to manage your stress. General instructions  Watch your dizziness for any changes.  Take over-the-counter and prescription medicines only as told by your health care provider. Talk with your health care provider if you think that your dizziness is caused by a medicine that you are taking.  Tell a friend or a family member that you are feeling dizzy. If he or she notices any changes in your behavior, have this person call your health care provider.  Keep all follow-up visits as told by your health care provider. This is  important. Contact a health care provider if:  Your dizziness does not go away.  Your dizziness or light-headedness gets worse.  You feel nauseous.  You have reduced hearing.  You have new symptoms.  You are unsteady on your feet or you feel like the room is spinning. Get help right away if:  You vomit or have diarrhea and are unable to eat or drink anything.  You have problems talking, walking, swallowing, or using your arms, hands, or legs.  You feel generally weak.  You are not thinking clearly or you have trouble forming sentences. It may take a friend or family member to notice this.  You have chest pain, abdominal pain, shortness of breath, or sweating.  Your vision changes.  You have any bleeding.  You have a severe headache.  You have neck pain or a stiff neck.  You have a fever. These symptoms  may represent a serious problem that is an emergency. Do not wait to see if the symptoms will go away. Get medical help right away. Call your local emergency services (911 in the [place] Do not drive yourself to the hospital. Summary  Dizziness is a feeling of unsteadiness or light-headedness. This condition can be caused by a number of things, including medicines, dehydration, or illness.  Anyone can become dizzy, but dizziness is more common in older adults.  Drink enough fluid to keep your urine clear or pale yellow. Do not drink alcohol.  Avoid making quick movements if you feel dizzy. Monitor your dizziness for any changes. This information is not intended to replace advice given to you by your health care provider. Make sure you discuss any questions you have with your health care provider. Document Released: 06/27/2000 Document Revised: 02/04/2016 Document Reviewed: 02/04/2016 Elsevier Interactive Patient Education  [employer].

## 2017-06-17 ENCOUNTER — Ambulatory Visit
Admission: RE | Admit: 2017-06-17 | Discharge: 2017-06-17 | Disposition: A | Discharge status: Home / Self Care | Payer: Managed Care, Other (non HMO) | Source: Ambulatory Visit | Attending: Obstetrics and Gynecology | Admitting: Obstetrics and Gynecology

## 2017-06-17 ENCOUNTER — Encounter: Payer: Self-pay | Admitting: Internal Medicine

## 2017-06-17 ENCOUNTER — Encounter: Payer: Self-pay | Admitting: Obstetrics and Gynecology

## 2017-06-17 DIAGNOSIS — F32A Depression, unspecified: Secondary | ICD-10-CM | POA: Insufficient documentation

## 2017-06-17 DIAGNOSIS — Z1231 Encounter for screening mammogram for malignant neoplasm of breast: Secondary | ICD-10-CM | POA: Diagnosis present

## 2017-06-17 DIAGNOSIS — R002 Palpitations: Secondary | ICD-10-CM | POA: Insufficient documentation

## 2017-06-17 DIAGNOSIS — J309 Allergic rhinitis, unspecified: Secondary | ICD-10-CM | POA: Insufficient documentation

## 2017-06-17 DIAGNOSIS — F419 Anxiety disorder, unspecified: Principal | ICD-10-CM

## 2017-06-17 DIAGNOSIS — Z1239 Encounter for other screening for malignant neoplasm of breast: Secondary | ICD-10-CM

## 2017-06-17 DIAGNOSIS — K219 Gastro-esophageal reflux disease without esophagitis: Secondary | ICD-10-CM | POA: Insufficient documentation

## 2017-06-17 DIAGNOSIS — R609 Edema, unspecified: Secondary | ICD-10-CM | POA: Insufficient documentation

## 2017-06-17 DIAGNOSIS — F329 Major depressive disorder, single episode, unspecified: Secondary | ICD-10-CM | POA: Insufficient documentation

## 2017-06-17 LAB — CBC WITH DIFFERENTIAL/PLATELET
BASOS ABS: 39 {cells}/uL (ref 0–200)
Basophils Relative: 0.4 %
EOS ABS: 127 {cells}/uL (ref 15–500)
Eosinophils Relative: 1.3 %
HCT: 39 % (ref 35.0–45.0)
Hemoglobin: 13.9 g/dL (ref 11.7–15.5)
Lymphs Abs: 2636 cells/uL (ref 850–3900)
MCH: 31.2 pg (ref 27.0–33.0)
MCHC: 35.6 g/dL (ref 32.0–36.0)
MCV: 87.4 fL (ref 80.0–100.0)
MONOS PCT: 6.3 %
MPV: 10.7 fL (ref 7.5–12.5)
NEUTROS PCT: 65.1 %
Neutro Abs: 6380 cells/uL (ref 1500–7800)
PLATELETS: 319 10*3/uL (ref 140–400)
RBC: 4.46 10*6/uL (ref 3.80–5.10)
RDW: 13.2 % (ref 11.0–15.0)
Total Lymphocyte: 26.9 %
WBC mixed population: 617 cells/uL (ref 200–950)
WBC: 9.8 10*3/uL (ref 3.8–10.8)

## 2017-06-17 LAB — COMPREHENSIVE METABOLIC PANEL
AG RATIO: 1.8 (calc) (ref 1.0–2.5)
ALT: 75 U/L — ABNORMAL HIGH (ref 6–29)
AST: 47 U/L — AB (ref 10–35)
Albumin: 4.7 g/dL (ref 3.6–5.1)
Alkaline phosphatase (APISO): 127 U/L (ref 33–130)
BUN: 17 mg/dL (ref 7–25)
CHLORIDE: 99 mmol/L (ref 98–110)
CO2: 31 mmol/L (ref 20–32)
Calcium: 9.5 mg/dL (ref 8.6–10.4)
Creat: 0.85 mg/dL (ref 0.50–1.05)
GLOBULIN: 2.6 g/dL (ref 1.9–3.7)
GLUCOSE: 122 mg/dL — AB (ref 65–99)
POTASSIUM: 3.6 mmol/L (ref 3.5–5.3)
SODIUM: 140 mmol/L (ref 135–146)
Total Bilirubin: 0.4 mg/dL (ref 0.2–1.2)
Total Protein: 7.3 g/dL (ref 6.1–8.1)

## 2017-06-17 LAB — URINALYSIS, ROUTINE W REFLEX MICROSCOPIC
BACTERIA UA: NONE SEEN /HPF
Bilirubin Urine: NEGATIVE
Glucose, UA: NEGATIVE
Hyaline Cast: NONE SEEN /LPF
KETONES UR: NEGATIVE
Leukocytes, UA: NEGATIVE
Nitrite: NEGATIVE
PROTEIN: NEGATIVE
SPECIFIC GRAVITY, URINE: 1.021 (ref 1.001–1.03)
SQUAMOUS EPITHELIAL / LPF: NONE SEEN /HPF (ref <=5)
WBC, UA: NONE SEEN /HPF (ref 0–5)
pH: 5.5 (ref 5.0–8.0)

## 2017-06-17 LAB — T4, FREE: Free T4: 0.9 ng/dL (ref 0.8–1.8)

## 2017-06-17 LAB — VITAMIN D 25 HYDROXY (VIT D DEFICIENCY, FRACTURES): VIT D 25 HYDROXY: 22 ng/mL — AB (ref 30–100)

## 2017-06-17 LAB — TSH: TSH: 3.01 m[IU]/L

## 2017-06-20 ENCOUNTER — Telehealth: Payer: Self-pay

## 2017-06-20 ENCOUNTER — Other Ambulatory Visit: Payer: Self-pay | Admitting: Internal Medicine

## 2017-06-20 DIAGNOSIS — E559 Vitamin D deficiency, unspecified: Secondary | ICD-10-CM

## 2017-06-20 MED ORDER — VITAMIN D (ERGOCALCIFEROL) 1.25 MG (50000 UNIT) PO CAPS: 50000.0000 [IU] | ORAL_CAPSULE | ORAL | 1 refills | Status: DC

## 2017-06-20 NOTE — Telephone Encounter (Signed)
Copied from [place] [phone] [phone]. Topic: Quick Communication - Lab Results >> Jun 20, 2017 11:02 AM [name] wrote: Pt was able to see her blood work results on [name]. Pt is calling one of her liver test was elevated and she would like to know what to do

## 2017-06-21 ENCOUNTER — Other Ambulatory Visit: Payer: Self-pay | Admitting: Internal Medicine

## 2017-06-21 ENCOUNTER — Telehealth: Payer: Self-pay

## 2017-06-21 DIAGNOSIS — E559 Vitamin D deficiency, unspecified: Secondary | ICD-10-CM

## 2017-06-21 MED ORDER — CHOLECALCIFEROL 1.25 MG (50000 UT) PO CAPS: 50000.0000 [IU] | ORAL_CAPSULE | ORAL | 1 refills | Status: DC

## 2017-06-21 NOTE — Telephone Encounter (Signed)
-----   Message from Dimple Nanas, RN sent at 06/21/2017  4:41 PM EDT ----- Pt given lab results per notes of Dr. Terese Door on 06/20/17. Pt verbalized understanding. Pt states she is agreeable to have [place] of abdomen. Pt requesting if possible to have US done in [place] in the earlier part of the morning. Pt also states she is going to be out of town on June19th-23rd.

## 2017-06-21 NOTE — Telephone Encounter (Signed)
Please see result note 

## 2017-06-25 ENCOUNTER — Other Ambulatory Visit: Payer: Self-pay | Admitting: Internal Medicine

## 2017-06-25 DIAGNOSIS — R7989 Other specified abnormal findings of blood chemistry: Secondary | ICD-10-CM

## 2017-06-25 DIAGNOSIS — R945 Abnormal results of liver function studies: Principal | ICD-10-CM

## 2017-06-26 ENCOUNTER — Encounter: Payer: Self-pay | Admitting: Emergency Medicine

## 2017-06-26 ENCOUNTER — Ambulatory Visit
Admission: EM | Admit: 2017-06-26 | Discharge: 2017-06-26 | Disposition: A | Discharge status: Home / Self Care | Payer: Managed Care, Other (non HMO) | Attending: Family Medicine | Admitting: Family Medicine

## 2017-06-26 ENCOUNTER — Other Ambulatory Visit: Payer: Self-pay

## 2017-06-26 DIAGNOSIS — N390 Urinary tract infection, site not specified: Secondary | ICD-10-CM

## 2017-06-26 DIAGNOSIS — J309 Allergic rhinitis, unspecified: Secondary | ICD-10-CM | POA: Diagnosis not present

## 2017-06-26 LAB — URINALYSIS, COMPLETE (UACMP) WITH MICROSCOPIC
Glucose, UA: NEGATIVE mg/dL
Hgb urine dipstick: NEGATIVE
LEUKOCYTES UA: NEGATIVE
Nitrite: NEGATIVE
PROTEIN: NEGATIVE mg/dL
Specific Gravity, Urine: 1.02 (ref 1.005–1.030)
pH: 6 (ref 5.0–8.0)

## 2017-06-26 MED ORDER — CEPHALEXIN 500 MG PO CAPS: 500.0000 mg | ORAL_CAPSULE | Freq: Two times a day (BID) | ORAL | 0 refills | Status: AC

## 2017-06-26 NOTE — ED Triage Notes (Signed)
Patient c/o cough and congestion that started 1 week ago. Patient also c/o urinary pressure and burning that started this morning.

## 2017-06-26 NOTE — ED Provider Notes (Signed)
[facility] ____________________________________________  Time seen: Approximately 2:25 PM  I have reviewed the triage vital signs and the nursing notes.   HISTORY  Chief Complaint urinary burning and URI   HPI Meghan Young is a 57 y.o. female presenting for evaluation of dysuria complaints as well as nasal congestion.  Patient states that she has had runny nose, nasal congestion and cough present for approximately 1 week.  Does report intermittent seasonal allergy issues.  Has not been taking any medications on a regular basis except for her normal Flonase.  Some throat irritation.  Denies known fevers, but did have some chills this morning.  No antipyretics taken prior to arrival.  Has continued to eat and drink well.   Patient also reports that she has had some lower midline abdominal pressure that goes into her urethra.  States feels like a burning sensation.  Also some urinary frequency.  Denies urinary urgency.  Denies vaginal complaints.  Denies any other abdominal pain, back pain, vomiting or diarrhea.  Denies aggravating or alleviating factors.  Reports otherwise feels well. Denies chest pain, shortness of breath, abdominal pain. Denies recent sickness. Denies recent antibiotic use.   McLean-Scocuzza, Nino Glow, MD: PCP   Past Medical History:  Diagnosis Date  . Allergy   . Anxiety   . Blood in stool   . Cataracts, bilateral    surgery right eye 01/2017   . Chicken pox   . Depression   . GERD (gastroesophageal reflux disease)   . Papilloma of nose    with h/o nose spur   . UTI (urinary tract infection)     Patient Active Problem List   Diagnosis Date Noted  . Anxiety 06/17/2017  . Edema 06/17/2017  . Palpitations 06/17/2017  . Allergic rhinitis 06/17/2017  . GERD (gastroesophageal reflux disease) 06/17/2017  . Guaiac positive stools 06/05/2017  . Rectal bleeding 06/05/2017  . ANA positive 01/19/2013    Past Surgical History:  Procedure Laterality  Date  . ABDOMINAL HYSTERECTOMY     partial   . APPENDECTOMY    . CATARACT EXTRACTION Right 01/23/2017   right eye only   . cataract surgery     01/2017 [place]  . CHOLECYSTECTOMY    . TONSILLECTOMY    . TONSILLECTOMY    . TOTAL VAGINAL HYSTERECTOMY  2002   due to uterine prolapse  . TUBAL LIGATION       No current facility-administered medications for this encounter.   Current Outpatient Medications:  .  albuterol (PROVENTIL HFA;VENTOLIN HFA) 108 (90 Base) MCG/ACT inhaler, Inhale 2 puffs into the lungs every 6 (six) hours as needed for wheezing or shortness of breath., Disp: 1 Inhaler, Rfl: 0 .  albuterol (PROVENTIL) (2.5 MG/3ML) 0.083% nebulizer solution, Take 3 mLs (2.5 mg total) by nebulization every 6 (six) hours as needed for wheezing or shortness of breath., Disp: 75 mL, Rfl: 12 .  ALPRAZolam (XANAX) 1 MG tablet, , Disp: , Rfl: 0 .  Cholecalciferol 50000 units capsule, Take 1 capsule (50,000 Units total) by mouth once a week., Disp: 13 capsule, Rfl: 1 .  DULoxetine (CYMBALTA) 60 MG capsule, Take 60 mg by mouth daily., Disp: , Rfl: 0 .  fluticasone (FLONASE) 50 MCG/ACT nasal spray, 1 or 2 sprays each nostril twice a day, Disp: 16 g, Rfl: 11 .  furosemide (LASIX) 40 MG tablet, Take 1 tablet (40 mg total) by mouth daily. 30 minutes before food, Disp: 90 tablet, Rfl: 1 .  omeprazole (PRILOSEC) 40 MG  capsule, Take 1 capsule (40 mg total) by mouth daily., Disp: 90 capsule, Rfl: 1 .  cephALEXin (KEFLEX) 500 MG capsule, Take 1 capsule (500 mg total) by mouth 2 (two) times daily for 7 days., Disp: 14 capsule, Rfl: 0  Allergies Patient has no known allergies.  Family History  Problem Relation Age of Onset  . Raynaud syndrome Mother   . Arthritis Mother   . Depression Mother   . Anxiety disorder Mother   . Early death Father        age 85? reason  . Depression Daughter   . Hypertension Son   . Arthritis Maternal Grandmother   . Hypertension Maternal Grandmother   . Miscarriages /  Stillbirths Maternal Grandmother   . Depression Maternal Grandfather   . Drug abuse Maternal Grandfather   . Early death Maternal Grandfather   . Heart disease Maternal Grandfather        tachycardia   . Arthritis Paternal Grandmother   . Hypertension Paternal Grandmother   . Early death Paternal Grandfather   . Breast cancer Neg Hx     Social History Social History   Tobacco Use  . Smoking status: Former Smoker    Last attempt to quit: 04/30/2013    Years since quitting: 4.1  . Smokeless tobacco: Never Used  Substance Use Topics  . Alcohol use: Yes    Alcohol/week: 4.2 oz    Types: 7 Standard drinks or equivalent per week    Comment: daily  . Drug use: No    Review of Systems Constitutional: As above.  ENT: As above. Cardiovascular: Denies chest pain. Respiratory: Denies shortness of breath. Gastrointestinal: No abdominal pain.  No nausea, no vomiting.  No diarrhea.  No constipation. Genitourinary: As above.  Musculoskeletal: Negative for back pain. Skin: Negative for rash.  ____________________________________________   PHYSICAL EXAM:  VITAL SIGNS: ED Triage Vitals  Enc Vitals Group     BP 06/26/17 1351 (!) 148/91     Pulse Rate 06/26/17 1351 98     Resp 06/26/17 1351 18     Temp 06/26/17 1351 98.3 F (36.8 C)     Temp Source 06/26/17 1351 Oral     SpO2 06/26/17 1351 98 %     Weight 06/26/17 1349 235 lb (106.6 kg)     Height 06/26/17 1349 5\' 3"  (1.6 m)     Head Circumference --      Peak Flow --      Pain Score 06/26/17 1349 4     Pain Loc --      Pain Edu? --      Excl. in [place]? --     Constitutional: Alert and oriented. Well appearing and in no acute distress. Eyes: Conjunctivae are normal. Head: Atraumatic. Minimal tenderness to palpation bilateral maxillary sinuses. No frontal sinus tenderness to palpation.  No swelling. No erythema.   Ears: no erythema, normal TMs bilaterally.   Nose: nasal congestion with bilateral nasal turbinate erythema and  edema.   Mouth/Throat: Mucous membranes are moist. Oropharynx non-erythematous.No tonsillar swelling or exudate.  Neck: No stridor.  No cervical spine tenderness to palpation. Hematological/Lymphatic/Immunilogical: No cervical lymphadenopathy. Cardiovascular: Normal rate, regular rhythm. Grossly normal heart sounds. Good peripheral circulation. Respiratory: Normal respiratory effort.  No retractions. No wheezes, rales or rhonchi. Good air movement.  Gastrointestinal: Soft and nontender.No CVA tenderness. Musculoskeletal: Steady gait. No cervical, thoracic or lumbar tenderness to palpation.  Neurologic:  Normal speech and language.No gait instability. Skin:  Skin is warm,  dry and intact. No rash noted. Psychiatric: Mood and affect are normal. Speech and behavior are normal.  ___________________________________________   LABS (all labs ordered are listed, but only abnormal results are displayed)  Labs Reviewed  URINALYSIS, COMPLETE (UACMP) WITH MICROSCOPIC - Abnormal; Notable for the following components:      Result Value   APPearance HAZY (*)    Bilirubin Urine SMALL (*)    Ketones, ur TRACE (*)    Bacteria, UA MANY (*)    All other components within normal limits  URINE CULTURE    PROCEDURES Procedures   INITIAL IMPRESSION / ASSESSMENT AND PLAN / ED COURSE  Pertinent labs & imaging results that were available during my care of the patient were reviewed by me and considered in my medical decision making (see chart for details).  Well-appearing patient.  No acute distress.  Suspect nasal congestion more allergic rhinitis versus viral upper respiratory infection.  Encourage continue Flonase as well as oral antihistamines and over-the-counter decongestants as needed.  Urinalysis reviewed, suspect UTI.  We will culture urine.  Will empirically start patient on oral Keflex.  Encourage rest, fluids, supportive care.Discussed indication, risks and benefits of medications with  patient.  Discussed follow up with Primary care physician this week. Discussed follow up and return parameters including no resolution or any worsening concerns. Patient verbalized understanding and agreed to plan.   ____________________________________________   FINAL CLINICAL IMPRESSION(S) / ED DIAGNOSES  Final diagnoses:  Allergic rhinitis, unspecified seasonality, unspecified trigger  Urinary tract infection without hematuria, site unspecified     ED Discharge Orders        Ordered    cephALEXin (KEFLEX) 500 MG capsule  2 times daily     06/26/17 1435       Note: This dictation was prepared with Dragon dictation along with smaller phrase technology. Any transcriptional errors that result from this process are unintentional.         Marylene Land, NP 06/26/17 1455

## 2017-06-26 NOTE — Discharge Instructions (Addendum)
Take medication as prescribed. Rest. Drink plenty of fluids.  ° °Follow up with your primary care physician this week as needed. Return to Urgent care for new or worsening concerns.  ° °

## 2017-06-28 LAB — URINE CULTURE

## 2017-07-01 ENCOUNTER — Encounter: Payer: Self-pay | Admitting: Internal Medicine

## 2017-07-02 ENCOUNTER — Ambulatory Visit: Payer: Managed Care, Other (non HMO)

## 2017-08-14 ENCOUNTER — Ambulatory Visit: Payer: Self-pay | Admitting: Internal Medicine

## 2017-10-25 ENCOUNTER — Ambulatory Visit: Payer: Managed Care, Other (non HMO) | Admitting: Internal Medicine

## 2017-10-25 ENCOUNTER — Encounter: Payer: Self-pay | Admitting: Internal Medicine

## 2017-10-25 VITALS — BP 120/90 | HR 94 | Temp 98.2°F | Resp 16 | Ht 63.5 in | Wt 240.0 lb

## 2017-10-25 DIAGNOSIS — R748 Abnormal levels of other serum enzymes: Secondary | ICD-10-CM

## 2017-10-25 DIAGNOSIS — K219 Gastro-esophageal reflux disease without esophagitis: Secondary | ICD-10-CM

## 2017-10-25 DIAGNOSIS — R600 Localized edema: Secondary | ICD-10-CM

## 2017-10-25 DIAGNOSIS — R062 Wheezing: Secondary | ICD-10-CM

## 2017-10-25 DIAGNOSIS — Z1283 Encounter for screening for malignant neoplasm of skin: Secondary | ICD-10-CM | POA: Diagnosis not present

## 2017-10-25 DIAGNOSIS — Z0184 Encounter for antibody response examination: Secondary | ICD-10-CM

## 2017-10-25 DIAGNOSIS — L989 Disorder of the skin and subcutaneous tissue, unspecified: Secondary | ICD-10-CM

## 2017-10-25 DIAGNOSIS — F419 Anxiety disorder, unspecified: Secondary | ICD-10-CM | POA: Diagnosis not present

## 2017-10-25 DIAGNOSIS — R739 Hyperglycemia, unspecified: Secondary | ICD-10-CM

## 2017-10-25 DIAGNOSIS — Z6841 Body Mass Index (BMI) 40.0 and over, adult: Secondary | ICD-10-CM

## 2017-10-25 DIAGNOSIS — E559 Vitamin D deficiency, unspecified: Secondary | ICD-10-CM

## 2017-10-25 DIAGNOSIS — R232 Flushing: Secondary | ICD-10-CM

## 2017-10-25 DIAGNOSIS — J309 Allergic rhinitis, unspecified: Secondary | ICD-10-CM

## 2017-10-25 DIAGNOSIS — Z1159 Encounter for screening for other viral diseases: Secondary | ICD-10-CM

## 2017-10-25 MED ORDER — OMEPRAZOLE 40 MG PO CPDR
40.0000 mg | DELAYED_RELEASE_CAPSULE | Freq: Every day | ORAL | 3 refills | Status: DC
Start: 2017-10-25 — End: 2018-09-17

## 2017-10-25 MED ORDER — ALBUTEROL SULFATE (2.5 MG/3ML) 0.083% IN NEBU: 2.5000 mg | INHALATION_SOLUTION | Freq: Four times a day (QID) | RESPIRATORY_TRACT | 12 refills | Status: AC | PRN

## 2017-10-25 MED ORDER — FUROSEMIDE 40 MG PO TABS: 40.0000 mg | ORAL_TABLET | Freq: Every day | ORAL | 3 refills | Status: DC | PRN

## 2017-10-25 MED ORDER — ALBUTEROL SULFATE HFA 108 (90 BASE) MCG/ACT IN AERS: 2.0000 | INHALATION_SPRAY | Freq: Four times a day (QID) | RESPIRATORY_TRACT | 12 refills | Status: AC | PRN

## 2017-10-25 NOTE — Progress Notes (Signed)
Chief Complaint  Patient presents with  . Anxiety    2 month follow up  . Flu Vaccine    recieved it yesterday     F/u  1. Anxiety stable on xanax 0.5 mg bid and 1 mg qhs on since 1999 on cymbalta 60 mg qd she does not want to go to [place] and see psychiatrist and rather get meds here  2. lfts elevated did not get US abdomen wants to wait until 1st of year disc c/w fatty liver  3. Vit  D def on D3 but not taking weekly 4. C/o intermittent hot flashes 5. C/o new mid forehead lesion below her mole   Review of Systems  Constitutional: Negative for weight loss.  HENT: Negative for hearing loss.   Eyes: Negative for blurred vision.  Respiratory: Negative for shortness of breath.   Cardiovascular: Negative for chest pain.  Gastrointestinal: Negative for abdominal pain.  Musculoskeletal: Negative for falls.  Skin: Negative for rash.       Mid forehead lesion new  Neurological: Negative for headaches.  Psychiatric/Behavioral: The patient is nervous/anxious.    Past Medical History:  Diagnosis Date  . Allergy   . Anxiety   . Blood in stool   . Cataracts, bilateral    surgery right eye 01/2017   . Chicken pox   . Depression   . GERD (gastroesophageal reflux disease)   . Papilloma of nose    with h/o nose spur   . UTI (urinary tract infection)    Past Surgical History:  Procedure Laterality Date  . ABDOMINAL HYSTERECTOMY     partial   . APPENDECTOMY    . CATARACT EXTRACTION Right 01/23/2017   right eye only   . cataract surgery     01/2017 [place]  . CHOLECYSTECTOMY    . TONSILLECTOMY    . TONSILLECTOMY    . TOTAL VAGINAL HYSTERECTOMY  2002   due to uterine prolapse  . TUBAL LIGATION     Family History  Problem Relation Age of Onset  . Raynaud syndrome Mother   . Arthritis Mother   . Depression Mother   . Anxiety disorder Mother   . Early death Father        age 84? reason  . Depression Daughter   . Hypertension Son   . Arthritis Maternal Grandmother   . Hypertension  Maternal Grandmother   . Miscarriages / Stillbirths Maternal Grandmother   . Depression Maternal Grandfather   . Drug abuse Maternal Grandfather   . Early death Maternal Grandfather   . Heart disease Maternal Grandfather        tachycardia   . Arthritis Paternal Grandmother   . Hypertension Paternal Grandmother   . Early death Paternal Grandfather   . Breast cancer Neg Hx    Social History   Socioeconomic History  . Marital status: Married    Spouse name: Not on file  . Number of children: Not on file  . Years of education: Not on file  . Highest education level: Not on file  Occupational History  . Not on file  Social Needs  . Financial resource strain: Not on file  . Food insecurity:    Worry: Not on file    Inability: Not on file  . Transportation needs:    Medical: Not on file    Non-medical: Not on file  Tobacco Use  . Smoking status: Former Smoker    Last attempt to quit: 04/30/2013    Years  since quitting: 4.4  . Smokeless tobacco: Never Used  Substance and Sexual Activity  . Alcohol use: Yes    Alcohol/week: 7.0 standard drinks    Types: 7 Standard drinks or equivalent per week    Comment: daily  . Drug use: No  . Sexual activity: Yes    Birth control/protection: Surgical  Lifestyle  . Physical activity:    Days per week: Not on file    Minutes per session: Not on file  . Stress: Not on file  Relationships  . Social connections:    Talks on phone: Not on file    Gets together: Not on file    Attends religious service: Not on file    Active member of club or organization: Not on file    Attends meetings of clubs or organizations: Not on file    Relationship status: Not on file  . Intimate partner violence:    Fear of current or ex partner: Not on file    Emotionally abused: Not on file    Physically abused: Not on file    Forced sexual activity: Not on file  Other Topics Concern  . 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 albuterol (PROVENTIL HFA;VENTOLIN HFA) 108 (90 Base) MCG/ACT inhaler Inhale 2 puffs into the lungs every 6 (six) hours as needed for wheezing or shortness of breath.  Marland Kitchen albuterol (PROVENTIL) (2.5 MG/3ML) 0.083% nebulizer solution Take 3 mLs (2.5 mg total) by nebulization every 6 (six) hours as needed for wheezing or shortness of breath.  . ALPRAZolam (XANAX) 1 MG tablet   . Cholecalciferol 50000 units capsule Take 1 capsule (50,000 Units total) by mouth once a week.  . DULoxetine (CYMBALTA) 60 MG capsule Take 60 mg by mouth daily.  . fluticasone (FLONASE) 50 MCG/ACT nasal spray 1 or 2 sprays each nostril twice a day  . furosemide (LASIX) 40 MG tablet Take 1 tablet (40 mg total) by mouth daily. 30 minutes before food  . omeprazole (PRILOSEC) 40 MG capsule Take 1 capsule (40 mg total) by mouth daily.   No Known Allergies No results found for this or any previous visit (from the past 2160 hour(s)). Objective  Body mass index is 41.85 kg/m. Wt Readings from Last 3 Encounters:  10/25/17 240 lb (108.9 kg)  06/26/17 235 lb (106.6 kg)  06/14/17 240 lb 3.2 oz (109 kg)   Temp Readings from Last 3 Encounters:  10/25/17 98.2 F (36.8 C) (Oral)  06/26/17 98.3 F (36.8 C) (Oral)  06/14/17 98.4 F (36.9 C) (Oral)   BP Readings from Last 3 Encounters:  10/25/17 120/90  06/26/17 (!) 148/91  06/14/17 136/84   Pulse Readings from Last 3 Encounters:  10/25/17 94  06/26/17 98  06/14/17 (!) 102    Physical Exam  Constitutional: She is oriented to person, place, and time. Vital signs are normal. She appears well-developed and well-nourished. She is cooperative.  HENT:  Head: Normocephalic and atraumatic.  Mouth/Throat: Oropharynx is clear and moist and mucous membranes are normal.  Eyes: Pupils are equal,  round, and reactive to light. Conjunctivae are normal.  Cardiovascular: Normal rate, regular rhythm and normal heart sounds.  Pulmonary/Chest: Effort normal and breath sounds normal.  Neurological: She is alert and oriented to person, place, and time. Gait normal.  Skin: Skin is warm and dry.     Psychiatric: She has a normal mood and affect. Her speech is normal and behavior is normal. Judgment and thought content normal. Cognition and memory are normal.  Nursing note and vitals reviewed.   Assessment   1. Anxiety  2. Mid forehead lesion  3. Elevated lfts  4. Vit D def 5. Hot flashes likely related menopause  6. HM Plan   1. Cont cymbalta 60 mg qd for now with xanax 0.5 mg bid and 1 mg qhs ok to refill pt does not want to see psychiatry Dr.Plovsky for now  2. Refer dermatology to check and tbse  3. Repeat labs 01/2018  Check hep ABC status  rec resch [place] did not do will do 1st of year w/u fatty liver  4. 50K IU weekly x 6 months then 5000 IU daily  5.  Consider d/c cymbalta and change to effexor xl pt to think about  6.  Had flu shot had 10/23/17 work health dept Tdap had [id number] see below  Disc shingrix disc today given info MMR immune Per pt had HIV testing for work to bring in record of A1C and lipid check   mammo sch 06/17/17 negative  Pap had 06/05/17 [facility] neg pap neg HPV GI appt for blood in stool [facility]  -pt did not have wants to do1/2020 Former smoker consider CT chest h/o nodular densities prior CT chest needs f/u disc at f/u  Referred dermatology today  Hep C and HIV neg 04/09/16  Hep B and MMR titers had health dept  -confirmed had varicella and MMR titers and immune   Disc Sax[place] today for obesity  Reviewed biometrics info from work though date unkown BMI 39.1 Wt 235 Waist 47 BP 134/76  Total cholesterol 170 HDL 98  TG 149?, LDL 129 ? This is unclear  Fasting glucose 116   Eye MD Dr. Eula Flax [facility]  Dentist Teressa Lower  Former PCP  Dr. Clemmie Krill had physical 05/2016 [facility]  Psych is Dr. Casimiro Needle [place]  Provider: Dr. Olivia Mackie McLean-Scocuzza-[facility]

## 2017-10-25 NOTE — Patient Instructions (Addendum)
Vitamin D3 x 6 months then D3 5000 IU daily month 7  shingrix  Call me back 1st of year to schedule colonoscopy, ultrasound and blood work sch labs 1st the year  Let me know when I need to refill Xanax Let me know if you want to try Effexor XL or Saxenda  Nonalcoholic Fatty Liver Disease Diet Nonalcoholic fatty liver disease is a condition that causes fat to accumulate in and around the liver. The disease makes it harder for the liver to work the way that it should. Following a healthy diet can help to keep nonalcoholic fatty liver disease under control. It can also help to prevent or improve conditions that are associated with the disease, such as heart disease, diabetes, high blood pressure, and abnormal cholesterol levels. Along with regular exercise, this diet:  Promotes weight loss.  Helps to control blood sugar levels.  Helps to improve the way that the body uses insulin.  What do I need to know about this diet?  Use the glycemic index (GI) to plan your meals. The index tells you how quickly a food will raise your blood sugar. Choose low-GI foods. These foods take a longer time to raise blood sugar.  Keep track of how many calories you take in. Eating the right amount of calories will help you to achieve a healthy weight.  You may want to follow a Mediterranean diet. This diet includes a lot of vegetables, lean meats or fish, whole grains, fruits, and healthy oils and fats. What foods can I eat? Grains Whole grains, such as whole-wheat or whole-grain breads, crackers, tortillas, cereals, and pasta. Stone-ground whole wheat. Pumpernickel bread. Unsweetened oatmeal. Bulgur. Barley. Quinoa. Brown or wild rice. Corn or whole-wheat flour tortillas. Vegetables Lettuce. Spinach. Peas. Beets. Cauliflower. Cabbage. Broccoli. Carrots. Tomatoes. Squash. Eggplant. Herbs. Peppers. Onions. Cucumbers. Brussels sprouts. Yams and sweet potatoes. Beans. Lentils. Fruits Bananas. Apples. Oranges.  Grapes. Papaya. Mango. Pomegranate. Kiwi. Grapefruit. Cherries. Meats and Other Protein Sources Seafood and shellfish. Lean meats. Poultry. Tofu. Dairy Low-fat or fat-free dairy products, such as yogurt, cottage cheese, and cheese. Beverages Water. Sugar-free drinks. Tea. Coffee. Low-fat or skim milk. Milk alternatives, such as soy or almond milk. Real fruit juice. Condiments Mustard. Relish. Low-fat, low-sugar ketchup and barbecue sauce. Low-fat or fat-free mayonnaise. Sweets and Desserts Sugar-free sweets. Fats and Oils Avocado. Canola or olive oil. Nuts and nut butters. Seeds. The items listed above may not be a complete list of recommended foods or beverages. Contact your dietitian for more options. What foods are not recommended? Palm oil and coconut oil. Processed foods. Fried foods. Sweetened drinks, such as sweet tea, milkshakes, snow cones, iced sweet drinks, and sodas. Alcohol. Sweets. Foods that contain a lot of salt or sodium. The items listed above may not be a complete list of foods and beverages to avoid. Contact your dietitian for more information. This information is not intended to replace advice given to you by your health care provider. Make sure you discuss any questions you have with your health care provider. Document Released: 05/18/2014 Document Revised: 06/09/2015 Document Reviewed: 01/26/2014 Elsevier Interactive Patient Education  2018 [place].  Fatty Liver Fatty liver, also called hepatic steatosis or steatohepatitis, is a condition in which too much fat has built up in your liver cells. The liver removes harmful substances from your bloodstream. It produces fluids your body needs. It also helps your body use and store energy from the food you eat. In many cases, fatty liver does not  cause symptoms or problems. It is often diagnosed when tests are being done for other reasons. However, over time, fatty liver can cause inflammation that may lead to more  serious liver problems, such as scarring of the liver (cirrhosis). What are the causes? Causes of fatty liver may include:  Drinking too much alcohol.  Poor nutrition.  Obesity.  Cushing syndrome.  Diabetes.  Hyperlipidemia.  Pregnancy.  Certain drugs.  Poisons.  Some viral infections.  What increases the risk? You may be more likely to develop fatty liver if you:  Abuse alcohol.  Are pregnant.  Are overweight.  Have diabetes.  Have hepatitis.  Have a high triglyceride level.  What are the signs or symptoms? Fatty liver often does not cause any symptoms. In cases where symptoms develop, they can include:  Fatigue.  Weakness.  Weight loss.  Confusion.  Abdominal pain.  Yellowing of your skin and the white parts of your eyes (jaundice).  Nausea and vomiting.  How is this diagnosed? Fatty liver may be diagnosed by:  Physical exam and medical history.  Blood tests.  Imaging tests, such as an ultrasound, CT scan, or MRI.  Liver biopsy. A small sample of liver tissue is removed using a needle. The sample is then looked at under a microscope.  How is this treated? Fatty liver is often caused by other health conditions. Treatment for fatty liver may involve medicines and lifestyle changes to manage conditions such as:  Alcoholism.  High cholesterol.  Diabetes.  Being overweight or obese.  Follow these instructions at home:  Eat a healthy diet as directed by your health care provider.  Exercise regularly. This can help you lose weight and control your cholesterol and diabetes. Talk to your health care provider about an exercise plan and which activities are best for you.  Do not drink alcohol.  Take medicines only as directed by your health care provider. Contact a health care provider if: You have difficulty controlling your:  Blood sugar.  Cholesterol.  Alcohol consumption.  Get help right away if:  You have abdominal  pain.  You have jaundice.  You have nausea and vomiting. This information is not intended to replace advice given to you by your health care provider. Make sure you discuss any questions you have with your health care provider. Document Released: 02/16/2005 Document Revised: 06/09/2015 Document Reviewed: 05/13/2013 Elsevier Interactive Patient Education  2018 [employer].   Venlafaxine extended-release capsules What is this medicine? VENLAFAXINE(VEN la fax een) is used to treat depression, anxiety and panic disorder. This medicine may be used for other purposes; ask your health care provider or pharmacist if you have questions. COMMON BRAND NAME(S): Effexor XR What should I tell my health care provider before I take this medicine? They need to know if you have any of these conditions: -bleeding disorders -glaucoma -heart disease -high blood pressure -high cholesterol -kidney disease -liver disease -low levels of sodium in the blood -mania or bipolar disorder -seizures -suicidal thoughts, plans, or attempt; a previous suicide attempt by you or a family -take medicines that treat or prevent blood clots -thyroid disease -an unusual or allergic reaction to venlafaxine, desvenlafaxine, other medicines, foods, dyes, or preservatives -pregnant or trying to get pregnant -breast-feeding How should I use this medicine? Take this medicine by mouth with a full glass of water. Follow the directions on the prescription label. Do not cut, crush, or chew this medicine. Take it with food. If needed, the capsule may be carefully opened  and the entire contents sprinkled on a spoonful of cool applesauce. Swallow the applesauce/pellet mixture right away without chewing and follow with a glass of water to ensure complete swallowing of the pellets. Try to take your medicine at about the same time each day. Do not take your medicine more often than directed. Do not stop taking this medicine suddenly  except upon the advice of your doctor. Stopping this medicine too quickly may cause serious side effects or your condition may worsen. A special MedGuide will be given to you by the pharmacist with each prescription and refill. Be sure to read this information carefully each time. Talk to your pediatrician regarding the use of this medicine in children. Special care may be needed. Overdosage: If you think you have taken too much of this medicine contact a poison control center or emergency room at once. NOTE: This medicine is only for you. Do not share this medicine with others. What if I miss a dose? If you miss a dose, take it as soon as you can. If it is almost time for your next dose, take only that dose. Do not take double or extra doses. What may interact with this medicine? Do not take this medicine with any of the following medications: -certain medicines for fungal infections like fluconazole, itraconazole, ketoconazole, posaconazole, voriconazole -cisapride -desvenlafaxine -dofetilide -dronedarone -duloxetine -levomilnacipran -linezolid -MAOIs like Carbex, Eldepryl, Marplan, Nardil, and Parnate -methylene blue (injected into a vein) -milnacipran -pimozide -thioridazine -ziprasidone This medicine may also interact with the following medications: -amphetamines -aspirin and aspirin-like medicines -certain medicines for depression, anxiety, or psychotic disturbances -certain medicines for migraine headaches like almotriptan, eletriptan, frovatriptan, naratriptan, rizatriptan, sumatriptan, zolmitriptan -certain medicines for sleep -certain medicines that treat or prevent blood clots like dalteparin, enoxaparin, warfarin -cimetidine -clozapine -diuretics -fentanyl -furazolidone -indinavir -isoniazid -lithium -metoprolol -NSAIDS, medicines for pain and inflammation, like ibuprofen or naproxen -other medicines that prolong the QT interval (cause an abnormal heart  rhythm) -procarbazine -rasagiline -supplements like St. John's wort, kava kava, valerian -tramadol -tryptophan This list may not describe all possible interactions. Give your health care provider a list of all the medicines, herbs, non-prescription drugs, or dietary supplements you use. Also tell them if you smoke, drink alcohol, or use illegal drugs. Some items may interact with your medicine. What should I watch for while using this medicine? Tell your doctor if your symptoms do not get better or if they get worse. Visit your doctor or health care professional for regular checks on your progress. Because it may take several weeks to see the full effects of this medicine, it is important to continue your treatment as prescribed by your doctor. Patients and their families should watch out for new or worsening thoughts of suicide or depression. Also watch out for sudden changes in feelings such as feeling anxious, agitated, panicky, irritable, hostile, aggressive, impulsive, severely restless, overly excited and hyperactive, or not being able to sleep. If this happens, especially at the beginning of treatment or after a change in dose, call your health care professional. This medicine can cause an increase in blood pressure. Check with your doctor for instructions on monitoring your blood pressure while taking this medicine. You may get drowsy or dizzy. Do not drive, use machinery, or do anything that needs mental alertness until you know how this medicine affects you. Do not stand or sit up quickly, especially if you are an older patient. This reduces the risk of dizzy or fainting spells. Alcohol may interfere with  the effect of this medicine. Avoid alcoholic drinks. Your mouth may get dry. Chewing sugarless gum, sucking hard candy and drinking plenty of water will help. Contact your doctor if the problem does not go away or is severe. What side effects may I notice from receiving this medicine? Side  effects that you should report to your doctor or health care professional as soon as possible: -allergic reactions like skin rash, itching or hives, swelling of the face, lips, or tongue -anxious -breathing problems -confusion -changes in vision -chest pain -confusion -elevated mood, decreased need for sleep, racing thoughts, impulsive behavior -eye pain -fast, irregular heartbeat -feeling faint or lightheaded, falls -feeling agitated, angry, or irritable -hallucination, loss of contact with reality -high blood pressure -loss of balance or coordination -palpitations -redness, blistering, peeling or loosening of the skin, including inside the mouth -restlessness, pacing, inability to keep still -seizures -stiff muscles -suicidal thoughts or other mood changes -trouble passing urine or change in the amount of urine -trouble sleeping -unusual bleeding or bruising -unusually weak or tired -vomiting Side effects that usually do not require medical attention (report to your doctor or health care professional if they continue or are bothersome): -change in sex drive or performance -change in appetite or weight -constipation -dizziness -dry mouth -headache -increased sweating -nausea -tired This list may not describe all possible side effects. Call your doctor for medical advice about side effects. You may report side effects to FDA at 1-800-FDA-1088. Where should I keep my medicine? Keep out of the reach of children. Store at a controlled temperature between 20 and 25 degrees C (68 degrees and 77 degrees F), in a dry place. Throw away any unused medicine after the expiration date. NOTE: This sheet is a summary. It may not cover all possible information. If you have questions about this medicine, talk to your doctor, pharmacist, or health care provider.  2018 Elsevier/Gold Standard (2015-06-02 18:38:02)   Liraglutide injection (Weight Management) What is this  medicine? LIRAGLUTIDE (LIR a GLOO tide) is used with a reduced calorie diet and exercise to help you lose weight. This medicine may be used for other purposes; ask your health care provider or pharmacist if you have questions. COMMON BRAND NAME(S): Saxenda What should I tell my health care provider before I take this medicine? They need to know if you have any of these conditions: -endocrine tumors (MEN 2) or if someone in your family had these tumors -gallbladder disease -high cholesterol -history of alcohol abuse problem -history of pancreatitis -kidney disease or if you are on dialysis -liver disease -previous swelling of the tongue, face, or lips with difficulty breathing, difficulty swallowing, hoarseness, or tightening of the throat -stomach problems -suicidal thoughts, plans, or attempt; a previous suicide attempt by you or a family member -thyroid cancer or if someone in your family had thyroid cancer -an unusual or allergic reaction to liraglutide, other medicines, foods, dyes, or preservatives -pregnant or trying to get pregnant -breast-feeding How should I use this medicine? This medicine is for injection under the skin of your upper leg, stomach area, or upper arm. You will be taught how to prepare and give this medicine. Use exactly as directed. Take your medicine at regular intervals. Do not take it more often than directed. It is important that you put your used needles and syringes in a special sharps container. Do not put them in a trash can. If you do not have a sharps container, call your pharmacist or healthcare provider to get one.  A special MedGuide will be given to you by the pharmacist with each prescription and refill. Be sure to read this information carefully each time. Talk to your pediatrician regarding the use of this medicine in children. Special care may be needed. Overdosage: If you think you have taken too much of this medicine contact a poison control  center or emergency room at once. NOTE: This medicine is only for you. Do not share this medicine with others. What if I miss a dose? If you miss a dose, take it as soon as you can. If it is almost time for your next dose, take only that dose. Do not take double or extra doses. If you miss your dose for 3 days or more, call your doctor or health care professional to talk about how to restart this medicine. What may interact with this medicine? -insulin and other medicines for diabetes This list may not describe all possible interactions. Give your health care provider a list of all the medicines, herbs, non-prescription drugs, or dietary supplements you use. Also tell them if you smoke, drink alcohol, or use illegal drugs. Some items may interact with your medicine. What should I watch for while using this medicine? Visit your doctor or health care professional for regular checks on your progress. This medicine is intended to be used in addition to a healthy diet and appropriate exercise. The best results are achieved this way. Do not increase or in any way change your dose without consulting your doctor or health care professional. Drink plenty of fluids while taking this medicine. Check with your doctor or health care professional if you get an attack of severe diarrhea, nausea, and vomiting. The loss of too much body fluid can make it dangerous for you to take this medicine. This medicine may affect blood sugar levels. If you have diabetes, check with your doctor or health care professional before you change your diet or the dose of your diabetic medicine. Patients and their families should watch out for worsening depression or thoughts of suicide. Also watch out for sudden changes in feelings such as feeling anxious, agitated, panicky, irritable, hostile, aggressive, impulsive, severely restless, overly excited and hyperactive, or not being able to sleep. If this happens, especially at the beginning  of treatment or after a change in dose, call your health care professional. What side effects may I notice from receiving this medicine? Side effects that you should report to your doctor or health care professional as soon as possible: -allergic reactions like skin rash, itching or hives, swelling of the face, lips, or tongue -breathing problems -diarrhea that continues or is severe -lump or swelling on the neck -severe nausea -signs and symptoms of infection like fever or chills; cough; sore throat; pain or trouble passing urine -signs and symptoms of low blood sugar such as feeling anxious, confusion, dizziness, increased hunger, unusually weak or tired, sweating, shakiness, cold, irritable, headache, blurred vision, fast heartbeat, loss of consciousness -signs and symptoms of kidney injury like trouble passing urine or change in the amount of urine -trouble swallowing -unusual stomach upset or pain -vomiting Side effects that usually do not require medical attention (report to your doctor or health care professional if they continue or are bothersome): -constipation -decreased appetite -diarrhea -fatigue -headache -nausea -pain, redness, or irritation at site where injected -stomach upset -stuffy or runny nose This list may not describe all possible side effects. Call your doctor for medical advice about side effects. You may report side  effects to FDA at 1-800-FDA-1088. Where should I keep my medicine? Keep out of the reach of children. Store unopened pen in a refrigerator between 2 and 8 degrees C (36 and 46 degrees F). Do not freeze or use if the medicine has been frozen. Protect from light and excessive heat. After you first use the pen, it can be stored at room temperature between 15 and 30 degrees C (59 and 86 degrees F) or in a refrigerator. Throw away your used pen after 30 days or after the expiration date, whichever comes first. Do not store your pen with the needle  attached. If the needle is left on, medicine may leak from the pen. NOTE: This sheet is a summary. It may not cover all possible information. If you have questions about this medicine, talk to your doctor, pharmacist, or health care provider.  2018 Elsevier/Gold Standard (2016-01-19 14:41:37)     Recombinant Zoster (Shingles) Vaccine, RZV: What You Need to Know 1. Why get vaccinated? Shingles (also called herpes zoster, or just zoster) is a painful skin rash, often with blisters. Shingles is caused by the varicella zoster virus, the same virus that causes chickenpox. After you have chickenpox, the virus stays in your body and can cause shingles later in life. You can't catch shingles from another person. However, a person who has never had chickenpox (or chickenpox vaccine) could get chickenpox from someone with shingles. A shingles rash usually appears on one side of the face or body and heals within 2 to 4 weeks. Its main symptom is pain, which can be severe. Other symptoms can include fever, headache, chills and upset stomach. Very rarely, a shingles infection can lead to pneumonia, hearing problems, blindness, brain inflammation (encephalitis), or death. For about 1 person in 5, severe pain can continue even long after the rash has cleared up. This long-lasting pain is called post-herpetic neuralgia (PHN). Shingles is far more common in people 79 years of age and older than in younger people, and the risk increases with age. It is also more common in people whose immune system is weakened because of a disease such as cancer, or by drugs such as steroids or chemotherapy. At least 1 million people a year in the [place] get shingles. 2. Shingles vaccine (recombinant) Recombinant shingles vaccine was approved by FDA in 2017 for the prevention of shingles. In clinical trials, it was more than 90% effective in preventing shingles. It can also reduce the likelihood of PHN. Two doses, 2 to 6  months apart, are recommended for adults 87 and older. This vaccine is also recommended for people who have already gotten the live shingles vaccine (Zostavax). There is no live virus in this vaccine. 3. Some people should not get this vaccine Tell your vaccine provider if you:  Have any severe, life-threatening allergies. A person who has ever had a life-threatening allergic reaction after a dose of recombinant shingles vaccine, or has a severe allergy to any component of this vaccine, may be advised not to be vaccinated. Ask your health care provider if you want information about vaccine components.  Are pregnant or breastfeeding. There is not much information about use of recombinant shingles vaccine in pregnant or nursing women. Your healthcare provider might recommend delaying vaccination.  Are not feeling well. If you have a mild illness, such as a cold, you can probably get the vaccine today. If you are moderately or severely ill, you should probably wait until you recover. Your doctor can advise  you.  4. Risks of a vaccine reaction With any medicine, including vaccines, there is a chance of reactions. After recombinant shingles vaccination, a person might experience:  Pain, redness, soreness, or swelling at the site of the injection  Headache, muscle aches, fever, shivering, fatigue  In clinical trials, most people got a sore arm with mild or moderate pain after vaccination, and some also had redness and swelling where they got the shot. Some people felt tired, had muscle pain, a headache, shivering, fever, stomach pain, or nausea. About 1 out of 6 people who got recombinant zoster vaccine experienced side effects that prevented them from doing regular activities. Symptoms went away on their own in about 2 to 3 days. Side effects were more common in younger people. You should still get the second dose of recombinant zoster vaccine even if you had one of these reactions after the first  dose. Other things that could happen after this vaccine:  People sometimes faint after medical procedures, including vaccination. Sitting or lying down for about 15 minutes can help prevent fainting and injuries caused by a fall. Tell your provider if you feel dizzy or have vision changes or ringing in the ears.  Some people get shoulder pain that can be more severe and longer-lasting than routine soreness that can follow injections. This happens very rarely.  Any medication can cause a severe allergic reaction. Such reactions to a vaccine are estimated at about 1 in a million doses, and would happen within a few minutes to a few hours after the vaccination. As with any medicine, there is a very remote chance of a vaccine causing a serious injury or death. The safety of vaccines is always being monitored. For more information, visit: http://www.aguilar.org/ 5. What if there is a serious problem? What should I look for?  Look for anything that concerns you, such as signs of a severe allergic reaction, very high fever, or unusual behavior. Signs of a severe allergic reaction can include hives, swelling of the face and throat, difficulty breathing, a fast heartbeat, dizziness, and weakness. These would usually start a few minutes to a few hours after the vaccination. What should I do?  If you think it is a severe allergic reaction or other emergency that can't wait, call 9-1-1 and get to the nearest hospital. Otherwise, call your health care provider. Afterward, the reaction should be reported to the Vaccine Adverse Event Reporting System (VAERS). Your doctor should file this report, or you can do it yourself through the VAERS web site atwww.vaers.https://www.bray.com/ by calling 519-822-9646. VAERS does not give medical advice. 6. How can I learn more?  Ask your healthcare provider. He or she can give you the vaccine package insert or suggest other sources of information.  Call your local or state  health department.  Contact the Centers for Disease Control and Prevention (CDC): ? Call 236-361-0148 (1-800-CDC-INFO) or ? Visit the CDC's website at http://hunter.com/ CDC Vaccine Information Statement (VIS) Recombinant Zoster Vaccine (02/27/2016) This information is not intended to replace advice given to you by your health care provider. Make sure you discuss any questions you have with your health care provider. Document Released: 03/13/2016 Document Revised: 03/13/2016 Document Reviewed: 03/13/2016 Elsevier Interactive Patient Education  [employer].

## 2017-11-15 ENCOUNTER — Ambulatory Visit: Payer: Managed Care, Other (non HMO) | Admitting: Internal Medicine

## 2017-11-15 ENCOUNTER — Encounter: Payer: Self-pay | Admitting: Internal Medicine

## 2017-11-15 VITALS — BP 120/82 | HR 90 | Temp 97.9°F | Ht 63.5 in | Wt 241.2 lb

## 2017-11-15 DIAGNOSIS — J40 Bronchitis, not specified as acute or chronic: Secondary | ICD-10-CM

## 2017-11-15 DIAGNOSIS — R05 Cough: Secondary | ICD-10-CM

## 2017-11-15 DIAGNOSIS — R059 Cough, unspecified: Secondary | ICD-10-CM

## 2017-11-15 MED ORDER — HYDROCOD POLST-CPM POLST ER 10-8 MG/5ML PO SUER
5.0000 mL | Freq: Every evening | ORAL | 0 refills | Status: DC | PRN
Start: 2017-11-15 — End: 2018-02-05

## 2017-11-15 MED ORDER — AZITHROMYCIN 250 MG PO TABS: ORAL_TABLET | ORAL | 0 refills | Status: DC

## 2017-11-15 MED ORDER — PREDNISONE 20 MG PO TABS: 20.0000 mg | ORAL_TABLET | Freq: Every day | ORAL | 0 refills | Status: DC

## 2017-11-15 MED ORDER — ALBUTEROL SULFATE (2.5 MG/3ML) 0.083% IN NEBU
2.5000 mg | INHALATION_SOLUTION | Freq: Once | RESPIRATORY_TRACT | Status: AC
  Administered 2017-11-15: 2.5 mg via RESPIRATORY_TRACT

## 2017-11-15 MED ORDER — IPRATROPIUM BROMIDE 0.02 % IN SOLN
0.5000 mg | Freq: Once | RESPIRATORY_TRACT | Status: AC
  Administered 2017-11-15: 0.5 mg via RESPIRATORY_TRACT

## 2017-11-15 NOTE — Progress Notes (Signed)
Chief Complaint  Patient presents with  . URI   Sick visit c/o cough and pnd. Cough is dry to productive yellow to clear sputum cough is deep she will cough so hard and feel dizzy tried Emergeny C and advil cold and sinus w/o relief. No fever but typically does not run a fever she does have a h/o allergies and bronchitis and she reports this happens about 3-4 x per year and allergies are worse in the fall    Review of Systems  Constitutional: Negative for weight loss.  HENT:       +PND  Respiratory: Positive for cough, sputum production and shortness of breath.   Cardiovascular: Negative for chest pain.  Skin: Negative for rash.  Neurological: Positive for dizziness.   Past Medical History:  Diagnosis Date  . Allergy   . Anxiety   . Blood in stool   . Cataracts, bilateral    surgery right eye 01/2017   . Chicken pox   . Depression   . GERD (gastroesophageal reflux disease)   . Papilloma of nose    with h/o nose spur   . UTI (urinary tract infection)    Past Surgical History:  Procedure Laterality Date  . ABDOMINAL HYSTERECTOMY     partial   . APPENDECTOMY    . CATARACT EXTRACTION Right 01/23/2017   right eye only   . cataract surgery     01/2017 [place]  . CHOLECYSTECTOMY    . TONSILLECTOMY    . TONSILLECTOMY    . TOTAL VAGINAL HYSTERECTOMY  2002   due to uterine prolapse  . TUBAL LIGATION     Family History  Problem Relation Age of Onset  . Raynaud syndrome Mother   . Arthritis Mother   . Depression Mother   . Anxiety disorder Mother   . Early death Father        age 1? reason  . Depression Daughter   . Hypertension Son   . Arthritis Maternal Grandmother   . Hypertension Maternal Grandmother   . Miscarriages / Stillbirths Maternal Grandmother   . Depression Maternal Grandfather   . Drug abuse Maternal Grandfather   . Early death Maternal Grandfather   . Heart disease Maternal Grandfather        tachycardia   . Arthritis Paternal Grandmother   .  Hypertension Paternal Grandmother   . Early death Paternal Grandfather   . Breast cancer Neg Hx    Social History   Socioeconomic History  . Marital status: Married    Spouse name: Not on file  . Number of children: Not on file  . Years of education: Not on file  . Highest education level: Not on file  Occupational History  . Not on file  Social Needs  . Financial resource strain: Not on file  . Food insecurity:    Worry: Not on file    Inability: Not on file  . Transportation needs:    Medical: Not on file    Non-medical: Not on file  Tobacco Use  . Smoking status: Former Smoker    Last attempt to quit: 04/30/2013    Years since quitting: 4.5  . Smokeless tobacco: Never Used  Substance and Sexual Activity  . Alcohol use: Yes    Alcohol/week: 7.0 standard drinks    Types: 7 Standard drinks or equivalent per week    Comment: daily  . Drug use: No  . Sexual activity: Yes    Birth control/protection: Surgical  Lifestyle  .  Physical activity:    Days per week: Not on file    Minutes per session: Not on file  . Stress: Not on file  Relationships  . Social connections:    Talks on phone: Not on file    Gets together: Not on file    Attends religious service: Not on file    Active member of club or organization: Not on file    Attends meetings of clubs or organizations: Not on file    Relationship status: Not on file  . Intimate partner violence:    Fear of current or ex partner: Not on file    Emotionally abused: Not on file    Physically abused: Not on file    Forced sexual activity: Not on file  Other Topics Concern  . 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 albuterol (PROVENTIL HFA;VENTOLIN HFA) 108 (90 Base) MCG/ACT inhaler Inhale 2 puffs into the lungs every 6  (six) hours as needed for wheezing or shortness of breath.  Marland Kitchen albuterol (PROVENTIL) (2.5 MG/3ML) 0.083% nebulizer solution Take 3 mLs (2.5 mg total) by nebulization every 6 (six) hours as needed for wheezing or shortness of breath.  . ALPRAZolam (XANAX) 1 MG tablet 1 mg 2 (two) times daily as needed (taking 0.5 mg bid and 1 mg qhs).   . Cholecalciferol 50000 units capsule Take 1 capsule (50,000 Units total) by mouth once a week.  . DULoxetine (CYMBALTA) 60 MG capsule Take 60 mg by mouth daily.  . fluticasone (FLONASE) 50 MCG/ACT nasal spray 1 or 2 sprays each nostril twice a day  . furosemide (LASIX) 40 MG tablet Take 1 tablet (40 mg total) by mouth daily as needed. 30 minutes before food  . omeprazole (PRILOSEC) 40 MG capsule Take 1 capsule (40 mg total) by mouth daily.   No Known Allergies No results found for this or any previous visit (from the past 2160 hour(s)). Objective  Body mass index is 42.06 kg/m. Wt Readings from Last 3 Encounters:  11/15/17 241 lb 3.2 oz (109.4 kg)  10/25/17 240 lb (108.9 kg)  06/26/17 235 lb (106.6 kg)   Temp Readings from Last 3 Encounters:  11/15/17 97.9 F (36.6 C) (Oral)  10/25/17 98.2 F (36.8 C) (Oral)  06/26/17 98.3 F (36.8 C) (Oral)   BP Readings from Last 3 Encounters:  11/15/17 120/82  10/25/17 120/90  06/26/17 (!) 148/91   Pulse Readings from Last 3 Encounters:  11/15/17 90  10/25/17 94  06/26/17 98    Physical Exam  Constitutional: She is oriented to person, place, and time. Vital signs are normal. She appears well-developed and well-nourished. She is cooperative.  HENT:  Head: Normocephalic and atraumatic.  Mouth/Throat: Oropharynx is clear and moist and mucous membranes are normal.  Eyes: Pupils are equal, round, and reactive to light. Conjunctivae are normal.  Cardiovascular: Normal rate, regular rhythm and normal heart sounds.  Pulmonary/Chest: Effort normal and breath sounds normal.  Deep barking cough   Neurological:  She is alert and oriented to person, place, and time. Gait normal.  Skin: Skin is warm, dry and intact.  Psychiatric: She has a normal mood and affect. Her speech is normal and behavior is normal. Judgment  and thought content normal. Cognition and memory are normal.  Nursing note and vitals reviewed.   Assessment   1. Bronchitis  2. HM Plan   1. duoneb x 1  Prednisone taper x 7 days l Prn albuterol  zpack  tussionex  Consider singulair in future with h/o allergies  2.  Had flu shot had 10/23/17 work health dept Tdap had 09/04/2016 see below  Disc shingrix disc today given info MMR immune Per pt had HIV testing for work to bring in record of A1C and lipid check   mammo sch 06/17/17 negative  Pap had5/22/19 [facility] neg pap [facility] not have wants to do1/2020 Former smoker consider CT chesth/o nodular densities prior CT chest needs f/udisc at f/u  Referred dermatology today  Hep C and HIV neg 04/09/16  Hep B and MMR titers had health dept -confirmed had varicella and MMR titers and immune  Reviewed biometrics info from work though date unkown BMI 39.1 Wt 235 Waist 47 BP 134/76  Total cholesterol 170 HDL 98  TG 149?, LDL 129 ? This is unclear  Fasting glucose 116   Provider: Dr. Olivia Mackie McLean-Scocuzza-[facility] Medicine

## 2017-11-15 NOTE — Patient Instructions (Addendum)
Call back if not better   Acute Bronchitis, Adult Acute bronchitis is sudden (acute) swelling of the air tubes (bronchi) in the lungs. Acute bronchitis causes these tubes to fill with mucus, which can make it hard to breathe. It can also cause coughing or wheezing. In adults, acute bronchitis usually goes away within 2 weeks. A cough caused by bronchitis may last up to 3 weeks. Smoking, allergies, and asthma can make the condition worse. Repeated episodes of bronchitis may cause further lung problems, such as chronic obstructive pulmonary disease (COPD). What are the causes? This condition can be caused by germs and by substances that irritate the lungs, including:  Cold and flu viruses. This condition is most often caused by the same virus that causes a cold.  Bacteria.  Exposure to tobacco smoke, dust, fumes, and air pollution.  What increases the risk? This condition is more likely to develop in people who:  Have close contact with someone with acute bronchitis.  Are exposed to lung irritants, such as tobacco smoke, dust, fumes, and vapors.  Have a weak immune system.  Have a respiratory condition such as asthma.  What are the signs or symptoms? Symptoms of this condition include:  A cough.  Coughing up clear, yellow, or green mucus.  Wheezing.  Chest congestion.  Shortness of breath.  A fever.  Body aches.  Chills.  A sore throat.  How is this diagnosed? This condition is usually diagnosed with a physical exam. During the exam, your health care provider may order tests, such as chest X-rays, to rule out other conditions. He or she may also:  Test a sample of your mucus for bacterial infection.  Check the level of oxygen in your blood. This is done to check for pneumonia.  Do a chest X-ray or lung function testing to rule out pneumonia and other conditions.  Perform blood tests.  Your health care provider will also ask about your symptoms and medical  history. How is this treated? Most cases of acute bronchitis clear up over time without treatment. Your health care provider may recommend:  Drinking more fluids. Drinking more makes your mucus thinner, which may make it easier to breathe.  Taking a medicine for a fever or cough.  Taking an antibiotic medicine.  Using an inhaler to help improve shortness of breath and to control a cough.  Using a cool mist vaporizer or humidifier to make it easier to breathe.  Follow these instructions at home: Medicines  Take over-the-counter and prescription medicines only as told by your health care provider.  If you were prescribed an antibiotic, take it as told by your health care provider. Do not stop taking the antibiotic even if you start to feel better. General instructions  Get plenty of rest.  Drink enough fluids to keep your urine clear or pale yellow.  Avoid smoking and secondhand smoke. Exposure to cigarette smoke or irritating chemicals will make bronchitis worse. If you smoke and you need help quitting, ask your health care provider. Quitting smoking will help your lungs heal faster.  Use an inhaler, cool mist vaporizer, or humidifier as told by your health care provider.  Keep all follow-up visits as told by your health care provider. This is important. How is this prevented? To lower your risk of getting this condition again:  Wash your hands often with soap and water. If soap and water are not available, use hand sanitizer.  Avoid contact with people who have cold symptoms.  Try  not to touch your hands to your mouth, nose, or eyes.  Make sure to get the flu shot every year.  Contact a health care provider if:  Your symptoms do not improve in 2 weeks of treatment. Get help right away if:  You cough up blood.  You have chest pain.  You have severe shortness of breath.  You become dehydrated.  You faint or keep feeling like you are going to faint.  You keep  vomiting.  You have a severe headache.  Your fever or chills gets worse. This information is not intended to replace advice given to you by your health care provider. Make sure you discuss any questions you have with your health care provider. Document Released: 02/09/2004 Document Revised: 07/27/2015 Document Reviewed: 06/22/2015 Elsevier Interactive Patient Education  [employer].

## 2017-11-15 NOTE — Progress Notes (Signed)
Pre visit review using our clinic review tool, if applicable. No additional management support is needed unless otherwise documented below in the visit note. 

## 2018-02-05 ENCOUNTER — Ambulatory Visit
Admission: EM | Admit: 2018-02-05 | Discharge: 2018-02-05 | Disposition: A | Discharge status: Home / Self Care | Payer: Managed Care, Other (non HMO) | Attending: Family Medicine | Admitting: Family Medicine

## 2018-02-05 ENCOUNTER — Other Ambulatory Visit: Payer: Self-pay

## 2018-02-05 DIAGNOSIS — J209 Acute bronchitis, unspecified: Secondary | ICD-10-CM | POA: Diagnosis not present

## 2018-02-05 MED ORDER — HYDROCOD POLST-CPM POLST ER 10-8 MG/5ML PO SUER: 5.0000 mL | Freq: Every evening | ORAL | 0 refills | Status: DC | PRN

## 2018-02-05 MED ORDER — PREDNISONE 50 MG PO TABS: ORAL_TABLET | ORAL | 0 refills | Status: DC

## 2018-02-05 MED ORDER — DOXYCYCLINE HYCLATE 100 MG PO CAPS: 100.0000 mg | ORAL_CAPSULE | Freq: Two times a day (BID) | ORAL | 0 refills | Status: DC

## 2018-02-05 NOTE — ED Triage Notes (Signed)
Patient complains of cough, congestion, sinus pain and pressure, cough is worse at night since Sunday. Reports h/o bronchitis.

## 2018-02-05 NOTE — ED Provider Notes (Signed)
[facility]    CSN: [id number] Arrival date & time: 02/05/18  1518     History   Chief Complaint Chief Complaint  Patient presents with  . Cough    APPT   HPI   58 year old female presents with the above complaint.  Patient states that she has not been feeling well since Sunday.  She reports cough, chest tightness.  Patient states that her cough is predominantly dry but is productive at times.  She is also had some sinus pain and pressure.  Cough worse at night.  No known relieving factors.  She reports subjective fever but no documented fever at home.  Patient states that she has a history of bronchitis.  She feels that this is similar to her past occurrences.  Former smoker.  No other associated symptoms.  No other complaints.  PMH, Surgical Hx, Family Hx, Social History reviewed and updated as below.  Past Medical History:  Diagnosis Date  . Allergy   . Anxiety   . Blood in stool   . Cataracts, bilateral    surgery right eye 01/2017   . Chicken pox   . Depression   . GERD (gastroesophageal reflux disease)   . Papilloma of nose    with h/o nose spur   . UTI (urinary tract infection)     Patient Active Problem List   Diagnosis Date Noted  . Skin lesion 10/25/2017  . Vitamin D deficiency 10/25/2017  . Elevated liver enzymes 10/25/2017  . Hot flashes 10/25/2017  . Morbid obesity with BMI of 40.0-44.9, adult ([place]) 10/25/2017  . Anxiety 06/17/2017  . Edema 06/17/2017  . Palpitations 06/17/2017  . Allergic rhinitis 06/17/2017  . GERD (gastroesophageal reflux disease) 06/17/2017  . Guaiac positive stools 06/05/2017  . Rectal bleeding 06/05/2017    Past Surgical History:  Procedure Laterality Date  . ABDOMINAL HYSTERECTOMY     partial   . APPENDECTOMY    . CATARACT EXTRACTION Right 01/23/2017   right eye only   . cataract surgery     01/2017 [place]  . CHOLECYSTECTOMY    . TONSILLECTOMY    . TONSILLECTOMY    . TOTAL VAGINAL HYSTERECTOMY  2002   due  to uterine prolapse  . TUBAL LIGATION      OB History   No obstetric history on file.      Home Medications    Prior to Admission medications   Medication Sig Start Date End Date Taking? Authorizing Provider  albuterol (PROVENTIL HFA;VENTOLIN HFA) 108 (90 Base) MCG/ACT inhaler Inhale 2 puffs into the lungs every 6 (six) hours as needed for wheezing or shortness of breath. 10/25/17  Yes McLean-Scocuzza, Nino Glow, MD  albuterol (PROVENTIL) (2.5 MG/3ML) 0.083% nebulizer solution Take 3 mLs (2.5 mg total) by nebulization every 6 (six) hours as needed for wheezing or shortness of breath. 10/25/17  Yes McLean-Scocuzza, Nino Glow, MD  ALPRAZolam Duanne Moron) 1 MG tablet 1 mg 2 (two) times daily as needed (taking 0.5 mg bid and 1 mg qhs).  08/16/16  Yes [provider]  Cholecalciferol 50000 units capsule Take 1 capsule (50,000 Units total) by mouth once a week. 06/21/17  Yes McLean-Scocuzza, Nino Glow, MD  DULoxetine (CYMBALTA) 60 MG capsule Take 60 mg by mouth daily. 02/13/16  Yes [provider]  fluticasone (FLONASE) 50 MCG/ACT nasal spray 1 or 2 sprays each nostril twice a day 06/14/17  Yes McLean-Scocuzza, Nino Glow, MD  furosemide (LASIX) 40 MG tablet Take 1 tablet (40 mg  total) by mouth daily as needed. 30 minutes before food 10/25/17  Yes McLean-Scocuzza, Nino Glow, MD  omeprazole (PRILOSEC) 40 MG capsule Take 1 capsule (40 mg total) by mouth daily. 10/25/17  Yes McLean-Scocuzza, Nino Glow, MD  chlorpheniramine-HYDROcodone [facility] PENNKINETIC ER) 10-8 MG/5ML SUER Take 5 mLs by mouth at bedtime as needed. 02/05/18   Coral Spikes, DO  doxycycline (VIBRAMYCIN) 100 MG capsule Take 1 capsule (100 mg total) by mouth 2 (two) times daily. 02/05/18   Coral Spikes, DO  predniSONE (DELTASONE) 50 MG tablet 1 tablet daily x 5 days 02/05/18   Coral Spikes, DO    Family History Family History  Problem Relation Age of Onset  . Raynaud syndrome Mother   . Arthritis Mother   . Depression Mother   .  Anxiety disorder Mother   . Early death Father        age 79? reason  . Depression Daughter   . Hypertension Son   . Arthritis Maternal Grandmother   . Hypertension Maternal Grandmother   . Miscarriages / Stillbirths Maternal Grandmother   . Depression Maternal Grandfather   . Drug abuse Maternal Grandfather   . Early death Maternal Grandfather   . Heart disease Maternal Grandfather        tachycardia   . Arthritis Paternal Grandmother   . Hypertension Paternal Grandmother   . Early death Paternal Grandfather   . Breast cancer Neg Hx     Social History Social History   Tobacco Use  . Smoking status: Former Smoker    Last attempt to quit: 04/30/2013    Years since quitting: 4.7  . Smokeless tobacco: Never Used  Substance Use Topics  . Alcohol use: Yes    Alcohol/week: 7.0 standard drinks    Types: 7 Standard drinks or equivalent per week    Comment: daily  . Drug use: No     Allergies   Patient has no known allergies.   Review of Systems Review of Systems  Constitutional: Negative for fever.  HENT: Positive for sinus pressure and sinus pain.   Respiratory: Positive for cough and chest tightness.    Physical Exam Triage Vital Signs ED Triage Vitals  Enc Vitals Group     BP 02/05/18 1534 (!) 145/91     Pulse Rate 02/05/18 1534 (!) 104     Resp 02/05/18 1534 18     Temp 02/05/18 1534 98.4 F (36.9 C)     Temp Source 02/05/18 1534 Oral     SpO2 02/05/18 1534 99 %     Weight 02/05/18 1532 235 lb (106.6 kg)     Height 02/05/18 1532 5\' 3"  (1.6 m)     Head Circumference --      Peak Flow --      Pain Score 02/05/18 1532 4     Pain Loc --      Pain Edu? --      Excl. in [place]? --    Updated Vital Signs BP (!) 145/91 (BP Location: Left Arm)   Pulse (!) 104   Temp 98.4 F (36.9 C) (Oral)   Resp 18   Ht 5\' 3"  (1.6 m)   Wt 106.6 kg   SpO2 99%   BMI 41.63 kg/m   Visual Acuity Right Eye Distance:   Left Eye Distance:   Bilateral Distance:    Right Eye  Near:   Left Eye Near:    Bilateral Near:     Physical Exam Vitals  signs and nursing note reviewed.  Constitutional:      General: She is not in acute distress. HENT:     Head: Normocephalic and atraumatic.     Right Ear: Tympanic membrane normal.     Left Ear: Tympanic membrane normal.     Nose: Nose normal.     Mouth/Throat:     Pharynx: Oropharynx is clear.  Eyes:     General:        Right eye: No discharge.        Left eye: No discharge.     Conjunctiva/sclera: Conjunctivae normal.  Cardiovascular:     Rate and Rhythm: Regular rhythm. Tachycardia present.  Pulmonary:     Effort: Pulmonary effort is normal.     Breath sounds: No wheezing, rhonchi or rales.  Neurological:     Mental Status: She is alert.  Psychiatric:        Mood and Affect: Mood normal.        Behavior: Behavior normal.    UC Treatments / Results  Labs (all labs ordered are listed, but only abnormal results are displayed) Labs Reviewed - No data to display  EKG None  Radiology No results found.  Procedures Procedures (including critical care time)  Medications Ordered in UC Medications - No data to display  Initial Impression / Assessment and Plan / UC Course  I have reviewed the triage vital signs and the nursing notes.  Pertinent labs & imaging results that were available during my care of the patient were reviewed by me and considered in my medical decision making (see chart for details).    58 year old female presents with bronchitis.  I have informed her that this is likely viral.  Treating with prednisone to Tussionex.  Given her history of recurrent bronchitis, I have given her a wait-and-see prescription for doxycycline to be filled if she fails to improve or worsens.  Final Clinical Impressions(s) / UC Diagnoses   Final diagnoses:  Acute bronchitis, unspecified organism   Discharge Instructions   None    ED Prescriptions    Medication Sig Dispense Auth. Provider    chlorpheniramine-HYDROcodone (TUSSIONEX PENNKINETIC ER) 10-8 MG/5ML SUER Take 5 mLs by mouth at bedtime as needed. 60 mL Mearl Harewood G, DO   predniSONE (DELTASONE) 50 MG tablet 1 tablet daily x 5 days 5 tablet Detrich Rakestraw G, DO   doxycycline (VIBRAMYCIN) 100 MG capsule Take 1 capsule (100 mg total) by mouth 2 (two) times daily. 14 capsule Coral Spikes, DO     Controlled Substance Prescriptions [place] Controlled Substance Registry consulted? Not Applicable   Coral Spikes, DO 02/05/18 1705

## 2018-03-26 ENCOUNTER — Ambulatory Visit: Payer: Self-pay | Admitting: Internal Medicine

## 2018-04-09 ENCOUNTER — Ambulatory Visit: Payer: Self-pay | Admitting: Internal Medicine

## 2018-04-10 ENCOUNTER — Ambulatory Visit (INDEPENDENT_AMBULATORY_CARE_PROVIDER_SITE_OTHER): Payer: Managed Care, Other (non HMO) | Admitting: Internal Medicine

## 2018-04-10 ENCOUNTER — Other Ambulatory Visit: Payer: Self-pay

## 2018-04-10 DIAGNOSIS — Z1329 Encounter for screening for other suspected endocrine disorder: Secondary | ICD-10-CM

## 2018-04-10 DIAGNOSIS — Z1231 Encounter for screening mammogram for malignant neoplasm of breast: Secondary | ICD-10-CM

## 2018-04-10 DIAGNOSIS — Z1389 Encounter for screening for other disorder: Secondary | ICD-10-CM

## 2018-04-10 DIAGNOSIS — Z13220 Encounter for screening for lipoid disorders: Secondary | ICD-10-CM

## 2018-04-10 DIAGNOSIS — E559 Vitamin D deficiency, unspecified: Secondary | ICD-10-CM

## 2018-04-10 DIAGNOSIS — F419 Anxiety disorder, unspecified: Secondary | ICD-10-CM

## 2018-04-10 DIAGNOSIS — F439 Reaction to severe stress, unspecified: Secondary | ICD-10-CM

## 2018-04-10 DIAGNOSIS — Z0000 Encounter for general adult medical examination without abnormal findings: Secondary | ICD-10-CM

## 2018-04-10 DIAGNOSIS — R7303 Prediabetes: Secondary | ICD-10-CM

## 2018-04-10 DIAGNOSIS — R748 Abnormal levels of other serum enzymes: Secondary | ICD-10-CM

## 2018-04-10 MED ORDER — DULOXETINE HCL 60 MG PO CPEP: 60.0000 mg | ORAL_CAPSULE | Freq: Every day | ORAL | 3 refills | Status: DC

## 2018-04-10 MED ORDER — ALPRAZOLAM 1 MG PO TABS: ORAL_TABLET | ORAL | 5 refills | Status: DC

## 2018-04-10 NOTE — Progress Notes (Signed)
telephone VISIT   Patient agreed to telephone visit and is aware that copayment and coinsurance may apply. Patient was treated using telemedicine according to accepted telemedicine protocols.  Subjective:   Patient complains of  Anxiety/stress and needs refill of cymbalta 60 mg qd and xanax takes 0.5 bid and 1 mg qhs. She works at [employer]	[place] with increased stress due to walk ins for wick   She reports A1C 5.7 and going to nutrition group and making lifestyle changes and increased exercise weigth down from 243 to 236   Expecting another grand child daughter due 06/2018   Viral URI tx'ed 02/05/18 urgent care and better   Patient Active Problem List   Diagnosis Date Noted  . Skin lesion 10/25/2017  . Vitamin D deficiency 10/25/2017  . Elevated liver enzymes 10/25/2017  . Hot flashes 10/25/2017  . Morbid obesity with BMI of 40.0-44.9, adult ([place]) 10/25/2017  . Anxiety 06/17/2017  . Edema 06/17/2017  . Palpitations 06/17/2017  . Allergic rhinitis 06/17/2017  . GERD (gastroesophageal reflux disease) 06/17/2017  . Guaiac positive stools 06/05/2017  . Rectal bleeding 06/05/2017   Social History   Tobacco Use  . Smoking status: Former Smoker    Last attempt to quit: 04/30/2013    Years since quitting: 4.9  . Smokeless tobacco: Never Used  Substance Use Topics  . Alcohol use: Yes    Alcohol/week: 7.0 standard drinks    Types: 7 Standard drinks or equivalent per week    Comment: daily    Current Outpatient Medications:  .  albuterol (PROVENTIL HFA;VENTOLIN HFA) 108 (90 Base) MCG/ACT inhaler, Inhale 2 puffs into the lungs every 6 (six) hours as needed for wheezing or shortness of breath., Disp: 1 Inhaler, Rfl: 12 .  albuterol (PROVENTIL) (2.5 MG/3ML) 0.083% nebulizer solution, Take 3 mLs (2.5 mg total) by nebulization every 6 (six) hours as needed for wheezing or shortness of breath., Disp: 75 mL, Rfl: 12 .  ALPRAZolam (XANAX) 1 MG tablet, taking 0.5 mg bid and 1 mg qhs prn, Disp: 60  tablet, Rfl: 5 .  chlorpheniramine-HYDROcodone (TUSSIONEX PENNKINETIC ER) 10-8 MG/5ML SUER, Take 5 mLs by mouth at bedtime as needed., Disp: 60 mL, Rfl: 0 .  Cholecalciferol 50000 units capsule, Take 1 capsule (50,000 Units total) by mouth once a week., Disp: 13 capsule, Rfl: 1 .  DULoxetine (CYMBALTA) 60 MG capsule, Take 1 capsule (60 mg total) by mouth daily., Disp: 90 capsule, Rfl: 3 .  fluticasone (FLONASE) 50 MCG/ACT nasal spray, 1 or 2 sprays each nostril twice a day, Disp: 16 g, Rfl: 11 .  furosemide (LASIX) 40 MG tablet, Take 1 tablet (40 mg total) by mouth daily as needed. 30 minutes before food, Disp: 90 tablet, Rfl: 3 .  omeprazole (PRILOSEC) 40 MG capsule, Take 1 capsule (40 mg total) by mouth daily., Disp: 90 capsule, Rfl: 3  No Known Allergies  Assessment and Plan:   Diagnosis:  1. Anxiety/stressors  2. Prediabetes wt down from 243 to 236  3. Viral URI resolved  4. HM  Plan  1.  Refilled xanax 0.5 mg bid and 1 mg qhs prn and cymbalta 60 mg qd  2. Check fasting labs for work [employer] pt to bring in form for me to fill out after fasting labs  3. Resolved ntd  4.  Pt to sch fasting labs and bring in work form for [employer] form ordered labs today  flu shothad 10/23/17 work health dept Tdap had8/21/2018 Disc shingrixdisc today given info  Hep B, MMR, varicella immune HCV and HIV neg 04/09/16    mammo sch 6/3/19negativeordered today  Pap had5/22/19 [facility] neg pap neg [facility] for [facility] did not have wants to do1/2020 but today 04/10/2018 disc and missed appt  -has not seen eye MD yet as well  rec healthy diet and exercise   Former smoker consider CT chesth/o nodular densities prior CT chest needs f/udisc at f/u  Referred dermatology today  Please see myChart communication and orders below.   No orders of the defined types were placed in this encounter.  Meds ordered this encounter  Medications  . ALPRAZolam (XANAX) 1 MG  tablet    Sig: taking 0.5 mg bid and 1 mg qhs prn    Dispense:  60 tablet    Refill:  5  . DULoxetine (CYMBALTA) 60 MG capsule    Sig: Take 1 capsule (60 mg total) by mouth daily.    Dispense:  90 capsule    Refill:  3    Generic    Nino Glow McLean-Scocuzza, MD 04/10/2018  A total of 15 minutes were spent by me to personally review the patient-generated inquiry, review patient records and data pertinent to assessment of the patient's problem, develop a management plan including generation of prescriptions and/or orders, and on subsequent communication with the patient through secure the MyChart portal service.   There is no separately reported E/M service related to this service in the past 7 days nor does the patient have an upcoming soonest available appointment for this issue. This work was completed in less than 7 days.   The patient consented to this service today (see patient agreement prior to ongoing communication). Patient counseled regarding the need for in-person exam for certain conditions and was advised to call the office if any changing or worsening symptoms occur.   The codes to be used for the E/M service are: '[]'   99421 for 5-10 minutes of time spent on the inquiry. '[x]'   99422 for 11-20 minutes. '[]'   99423 for 21+ minutes.

## 2018-04-11 ENCOUNTER — Encounter: Payer: Self-pay | Admitting: Internal Medicine

## 2018-04-11 ENCOUNTER — Ambulatory Visit: Payer: Self-pay | Admitting: Internal Medicine

## 2018-04-23 ENCOUNTER — Telehealth: Payer: Self-pay | Admitting: Internal Medicine

## 2018-04-23 NOTE — Telephone Encounter (Signed)
sch fasting lab appt by or after 06/15/2018 no food only water meds  Sch f/u with me by 9 or 10/2018   Thanks Thayer

## 2018-06-18 ENCOUNTER — Telehealth: Payer: Self-pay | Admitting: *Deleted

## 2018-06-18 ENCOUNTER — Other Ambulatory Visit: Payer: Managed Care, Other (non HMO)

## 2018-06-18 NOTE — Telephone Encounter (Signed)
Copied from [place] [id number]. Topic: Appointment Scheduling - Scheduling Inquiry for Clinic >> Jun 17, 2018  5:00 PM Alanda Slim E wrote: Reason for CRM: Pt called to cancel her lab appt for 6.3.20 due to her daughter possibly going into labor. She will call to reschedule / please advise

## 2018-06-18 NOTE — Telephone Encounter (Signed)
FYI

## 2018-07-24 ENCOUNTER — Encounter: Payer: Self-pay | Admitting: Internal Medicine

## 2018-07-29 ENCOUNTER — Telehealth: Payer: Self-pay

## 2018-07-29 NOTE — Telephone Encounter (Signed)
Pt calling c/o burning, pressure lower abdomen and back; no sex; no urinary frequency; been going on for 2d; uncomfortable to sit.  Possible urine ck?  w [phone]; c [phone]  Left detailed msg on c# that we have no openings today.  Adv PCP or urgent care.

## 2018-08-04 ENCOUNTER — Ambulatory Visit (INDEPENDENT_AMBULATORY_CARE_PROVIDER_SITE_OTHER): Payer: Managed Care, Other (non HMO) | Admitting: Family Medicine

## 2018-08-04 ENCOUNTER — Encounter: Payer: Self-pay | Admitting: Family Medicine

## 2018-08-04 ENCOUNTER — Other Ambulatory Visit: Payer: Self-pay

## 2018-08-04 ENCOUNTER — Telehealth: Payer: Self-pay | Admitting: *Deleted

## 2018-08-04 DIAGNOSIS — R110 Nausea: Secondary | ICD-10-CM

## 2018-08-04 DIAGNOSIS — R509 Fever, unspecified: Secondary | ICD-10-CM | POA: Diagnosis not present

## 2018-08-04 DIAGNOSIS — N3000 Acute cystitis without hematuria: Secondary | ICD-10-CM | POA: Diagnosis not present

## 2018-08-04 DIAGNOSIS — R52 Pain, unspecified: Secondary | ICD-10-CM | POA: Diagnosis not present

## 2018-08-04 DIAGNOSIS — Z20828 Contact with and (suspected) exposure to other viral communicable diseases: Secondary | ICD-10-CM | POA: Diagnosis not present

## 2018-08-04 DIAGNOSIS — Z20822 Contact with and (suspected) exposure to covid-19: Secondary | ICD-10-CM

## 2018-08-04 MED ORDER — ONDANSETRON 4 MG PO TBDP: 4.0000 mg | ORAL_TABLET | Freq: Three times a day (TID) | ORAL | 0 refills | Status: DC | PRN

## 2018-08-04 MED ORDER — CEPHALEXIN 500 MG PO CAPS: 500.0000 mg | ORAL_CAPSULE | Freq: Two times a day (BID) | ORAL | 0 refills | Status: DC

## 2018-08-04 NOTE — Progress Notes (Signed)
Patient ID: Meghan Young, female   DOB: 05-22-1960, 58 y.o.   MRN: [id number]    Virtual Visit via video Note  This visit type was conducted due to national recommendations for restrictions regarding the COVID-19 pandemic (e.g. social distancing).  This format is felt to be most appropriate for this patient at this time.  All issues noted in this document were discussed and addressed.  No physical exam was performed (except for noted visual exam findings with Video Visits).   I connected with Delorise Shiner today at  9:00 AM EDT by a video enabled telemedicine application or telephone and verified that I am speaking with the correct person using two identifiers. Location patient: home Location provider: work or home office Persons participating in the virtual visit: patient, provider  I discussed the limitations, risks, security and privacy concerns of performing an evaluation and management service by telephone and the availability of in person appointments. I also discussed with the patient that there may be a patient responsible charge related to this service. The patient expressed understanding and agreed to proceed.  HPI:  Patient and I connected via video due to complaints of burning and pressure with urination for about a week, also reports fever and chills and nausea.  Patient states she bought Azo this weekend, this is helped calm down the burning with urination somewhat but still has pressure and increased frequency.  Denies any visible blood in urine.  Patient states she is also concerned about possible COVID-19 due to feeling feverish and chilled as well as having of some nausea.  Patient has been back to work, but they have been wearing masks at work.  However due to her symptoms, she is not sure whether or not she potentially could have COVID-19.  Denies cough, shortness of breath or wheezing.  Denies chest pain.  Denies diarrhea.  Has been trying to keep self well-hydrated with  lots of water.  ROS: See pertinent positives and negatives per HPI.  Past Medical History:  Diagnosis Date  . Allergy   . Anxiety   . Blood in stool   . Cataracts, bilateral    surgery right eye 01/2017   . Chicken pox   . Depression   . GERD (gastroesophageal reflux disease)   . Papilloma of nose    with h/o nose spur   . UTI (urinary tract infection)     Past Surgical History:  Procedure Laterality Date  . ABDOMINAL HYSTERECTOMY     partial   . APPENDECTOMY    . CATARACT EXTRACTION Right 01/23/2017   right eye only   . cataract surgery     01/2017 [place]  . CHOLECYSTECTOMY    . TONSILLECTOMY    . TONSILLECTOMY    . TOTAL VAGINAL HYSTERECTOMY  2002   due to uterine prolapse  . TUBAL LIGATION      Family History  Problem Relation Age of Onset  . Raynaud syndrome Mother   . Arthritis Mother   . Depression Mother   . Anxiety disorder Mother   . Early death Father        age 31? reason  . Depression Daughter   . Hypertension Son   . Arthritis Maternal Grandmother   . Hypertension Maternal Grandmother   . Miscarriages / Stillbirths Maternal Grandmother   . Depression Maternal Grandfather   . Drug abuse Maternal Grandfather   . Early death Maternal Grandfather   . Heart disease Maternal Grandfather  tachycardia   . Arthritis Paternal Grandmother   . Hypertension Paternal Grandmother   . Early death Paternal Grandfather   . Breast cancer Neg Hx    Social History   Tobacco Use  . Smoking status: Former Smoker    Quit date: 04/30/2013    Years since quitting: 5.2  . Smokeless tobacco: Never Used  Substance Use Topics  . Alcohol use: Yes    Alcohol/week: 7.0 standard drinks    Types: 7 Standard drinks or equivalent per week    Comment: daily    Current Outpatient Medications:  .  albuterol (PROVENTIL HFA;VENTOLIN HFA) 108 (90 Base) MCG/ACT inhaler, Inhale 2 puffs into the lungs every 6 (six) hours as needed for wheezing or shortness of breath., Disp: 1  Inhaler, Rfl: 12 .  albuterol (PROVENTIL) (2.5 MG/3ML) 0.083% nebulizer solution, Take 3 mLs (2.5 mg total) by nebulization every 6 (six) hours as needed for wheezing or shortness of breath., Disp: 75 mL, Rfl: 12 .  ALPRAZolam (XANAX) 1 MG tablet, taking 0.5 mg bid and 1 mg qhs prn, Disp: 60 tablet, Rfl: 5 .  chlorpheniramine-HYDROcodone (TUSSIONEX PENNKINETIC ER) 10-8 MG/5ML SUER, Take 5 mLs by mouth at bedtime as needed., Disp: 60 mL, Rfl: 0 .  Cholecalciferol 50000 units capsule, Take 1 capsule (50,000 Units total) by mouth once a week., Disp: 13 capsule, Rfl: 1 .  DULoxetine (CYMBALTA) 60 MG capsule, Take 1 capsule (60 mg total) by mouth daily., Disp: 90 capsule, Rfl: 3 .  fluticasone (FLONASE) 50 MCG/ACT nasal spray, 1 or 2 sprays each nostril twice a day, Disp: 16 g, Rfl: 11 .  furosemide (LASIX) 40 MG tablet, Take 1 tablet (40 mg total) by mouth daily as needed. 30 minutes before food, Disp: 90 tablet, Rfl: 3 .  omeprazole (PRILOSEC) 40 MG capsule, Take 1 capsule (40 mg total) by mouth daily., Disp: 90 capsule, Rfl: 3  EXAM:  GENERAL: alert, oriented, appears well and in no acute distress  HEENT: atraumatic, conjunttiva clear, no obvious abnormalities on inspection of external nose and ears  NECK: normal movements of the head and neck  LUNGS: on inspection no signs of respiratory distress, breathing rate appears normal, no obvious gross SOB, gasping or wheezing  CV: no obvious cyanosis  MS: moves all visible extremities without noticeable abnormality  PSYCH/NEURO: pleasant and cooperative, no obvious depression or anxiety, speech and thought processing grossly intact  ASSESSMENT AND PLAN:  Discussed the following assessment and plan:  Suspected COVID-19 virus infection, fever/chills, nausea - advised that due to symptoms, we need to get patient set up for COVID-19 testing.  Patient advised that I will put order in and he can go to testing location for for drive-through testing.  Patient given the address of testing site.  Patient advised that testing is taking 2 to 7 days to result, and while we are awaiting results patient must remain under self quarantine and monitor for any changing/worsening symptoms.  Advised over-the-counter medications such as Tylenol can be used to help treat pain or fevers. Also discussed getting plenty of rest and increasing fluid intake.  Made patient aware that test results as well as how his symptoms progress will determine when the self quarantine will be able to end.  Also advised to monitor self for any worsening symptoms, advised if severe shortness of breath develops, high fever that is not reduced with use of Tylenol, chest pain, severe vomiting or diarrhea  --patient must call on-call and or go to ER  right away for evaluation. patient verbalized understanding of these instructions.  Urinary tract infection - advised patient that her symptoms do sound consistent with a UTI due to burning, increased frequency and pressure with urination.  We will treat with Keflex twice daily for 7 days.  Advised patient that the nausea could potentially be from UTI.   She will use Zofran as needed for nausea.  Advised to increase water intake and avoid excess sugary caffeinated beverages.     I discussed the assessment and treatment plan with the patient. The patient was provided an opportunity to ask questions and all were answered. The patient agreed with the plan and demonstrated an understanding of the instructions.   The patient was advised to call back or seek an in-person evaluation if the symptoms worsen or if the condition fails to improve as anticipated.     Jodelle Green, FNP

## 2018-08-04 NOTE — Telephone Encounter (Signed)
Scheduled Doxy with NP Guse, patient having polyuria, dysuria, with fever and chills.

## 2018-08-04 NOTE — Telephone Encounter (Signed)
Copied from [place] [phone]. Topic: Appointment Scheduling - Scheduling Inquiry for Clinic >> Aug 04, 2018  7:55 AM Scherrie Gerlach wrote: Reason for CRM: pt having UTI symptoms, but also chills, fever. Wants appts appt asap. Advised someone will call her back to schedule

## 2018-08-06 ENCOUNTER — Telehealth: Payer: Self-pay

## 2018-08-06 NOTE — Telephone Encounter (Signed)
Copied from [place] [phone]. Topic: General - Other >> Aug 06, 2018 11:34 AM Rainey Pines A wrote: Patient would a callback from nurse in regards to medication because she is still having UTI symptoms.

## 2018-08-06 NOTE — Telephone Encounter (Signed)
Pt called Meghan Young Called and spoke to pt.  Pt said that she had a virtual with Lauren Guse a few days ago and was prescribed cephalexin.  Pt said that her Rx was only written for 7 capsules twice a day.  Pt wasn't sure if the Rx should have been for 7 days, if so she would not have enough.  Please advise.

## 2018-08-06 NOTE — Telephone Encounter (Signed)
Called pharmacy to give verbal order to have 7 more capsules of Cephalexin 500mg  dispensed.

## 2018-08-06 NOTE — Telephone Encounter (Signed)
Yes it should have been for a total of 14 capsules to take two times a day for 7 day course  [name]- can you call and give a verbal order to dispense 7 more cephalexin 500 mg tablets to complete the 2x a day for 7 days course.  If they need a new RX, I can send   Thanks,  LG

## 2018-08-06 NOTE — Telephone Encounter (Signed)
Called and spoke to pt.  Pt said that she had a virtual with Lauren Guse a few days ago and was prescribed cephalexin.  Pt said that her Rx was only written for 7 capsules twice a day.  Pt wasn't sure if the Rx should have been for 7 days, if so she would not have enough.  Please advise.

## 2018-08-08 ENCOUNTER — Telehealth: Payer: Self-pay

## 2018-08-08 ENCOUNTER — Encounter: Payer: Self-pay | Admitting: Internal Medicine

## 2018-08-08 LAB — NOVEL CORONAVIRUS, NAA: SARS-CoV-2, NAA: NOT DETECTED

## 2018-08-08 NOTE — Telephone Encounter (Signed)
Patient would like letter back to work.  20-24. Patient needs to return back to work on the [id number]. [phone] fax # attn: Elmyra Ricks.

## 2018-08-08 NOTE — Telephone Encounter (Signed)
Copied from [place] [phone]. Topic: General - Other >> Aug 08, 2018 10:49 AM Rainey Pines A wrote: Patient is aware of covid results and would like to speak with nurse on her getting a note to return back to work . Patient is feeling better.Patient stated that it is too late in the day for her to make it to work today. Patient would like a call back as soon as possible [phone]

## 2018-08-08 NOTE — Telephone Encounter (Signed)
Letter ready in my chart  [place]

## 2018-08-11 ENCOUNTER — Ambulatory Visit: Payer: Managed Care, Other (non HMO) | Admitting: Obstetrics and Gynecology

## 2018-08-12 NOTE — Progress Notes (Signed)
McLean-Scocuzza, Nino Glow, MD   Chief Complaint  Patient presents with  . Urinary Tract Infection    burning discomfort in vaginal area, cloudy urine, no frequency x 1 week    HPI:      Ms. Camya Haydon is a 58 y.o. No obstetric history on file. who LMP was No LMP recorded. Patient has had a hysterectomy., presents today for burning at urethra for 2 wks. Also had urinary frequency/urgency, suprapubic pressure starting 2 wks ago. No hematuria, no vag bleeding. 08/06/18 Was started on keflex BID for 7 days by PCP without UA due to pt having fever/chills (neg Covid testing). Pt states other urin sx improved but still has urgency and urethral burning. No vag sx. Not sex active. No LBP, pelvic pain, fevers. Drinking lots of water.  S/p hyst. Having vasomotor sx. Last annual 5/19, has appt 8/20.  Past Medical History:  Diagnosis Date  . Allergy   . Anxiety   . Blood in stool   . Cataracts, bilateral    surgery right eye 01/2017   . Chicken pox   . Depression   . GERD (gastroesophageal reflux disease)   . Papilloma of nose    with h/o nose spur   . UTI (urinary tract infection)     Past Surgical History:  Procedure Laterality Date  . ABDOMINAL HYSTERECTOMY     partial   . APPENDECTOMY    . CATARACT EXTRACTION Right 01/23/2017   right eye only   . cataract surgery     01/2017 [place]  . CHOLECYSTECTOMY    . TONSILLECTOMY    . TONSILLECTOMY    . TOTAL VAGINAL HYSTERECTOMY  2002   due to uterine prolapse  . TUBAL LIGATION      Family History  Problem Relation Age of Onset  . Raynaud syndrome Mother   . Arthritis Mother   . Depression Mother   . Anxiety disorder Mother   . Early death Father        age 60? reason  . Depression Daughter   . Hypertension Son   . Arthritis Maternal Grandmother   . Hypertension Maternal Grandmother   . Miscarriages / Stillbirths Maternal Grandmother   . Depression Maternal Grandfather   . Drug abuse Maternal Grandfather   . Early death  Maternal Grandfather   . Heart disease Maternal Grandfather        tachycardia   . Arthritis Paternal Grandmother   . Hypertension Paternal Grandmother   . Early death Paternal Grandfather   . Breast cancer Neg Hx     Social History   Socioeconomic History  . Marital status: Married    Spouse name: Not on file  . Number of children: Not on file  . Years of education: Not on file  . Highest education level: Not on file  Occupational History  . Not on file  Social Needs  . Financial resource strain: Not on file  . Food insecurity    Worry: Not on file    Inability: Not on file  . Transportation needs    Medical: Not on file    Non-medical: Not on file  Tobacco Use  . Smoking status: Former Smoker    Quit date: 04/30/2013    Years since quitting: 5.2  . Smokeless tobacco: Never Used  Substance and Sexual Activity  . Alcohol use: Yes    Alcohol/week: 7.0 standard drinks    Types: 7 Standard drinks or equivalent per week  Comment: daily  . Drug use: No  . Sexual activity: Yes    Birth control/protection: Surgical  Lifestyle  . Physical activity    Days per week: Not on file    Minutes per session: Not on file  . Stress: Not on file  Relationships  . Social Herbalist on phone: Not on file    Gets together: Not on file    Attends religious service: Not on file    Active member of club or organization: Not on file    Attends meetings of clubs or organizations: Not on file    Relationship status: Not on file  . Intimate partner violence    Fear of current or ex partner: Not on file    Emotionally abused: Not on file    Physically abused: Not on file    Forced sexual activity: Not on file  Other Topics Concern  . 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 albuterol (PROVENTIL HFA;VENTOLIN HFA) 108 (90 Base) MCG/ACT inhaler Inhale 2 puffs into the lungs every 6 (six) hours as needed for wheezing or shortness of breath. 1 Inhaler 12  . albuterol (PROVENTIL) (2.5 MG/3ML) 0.083% nebulizer solution Take 3 mLs (2.5 mg total) by nebulization every 6 (six) hours as needed for wheezing or shortness of breath. 75 mL 12  . ALPRAZolam (XANAX) 1 MG tablet taking 0.5 mg bid and 1 mg qhs prn 60 tablet 5  . Cholecalciferol 50000 units capsule Take 1 capsule (50,000 Units total) by mouth once a week. 13 capsule 1  . DULoxetine (CYMBALTA) 60 MG capsule Take 1 capsule (60 mg total) by mouth daily. 90 capsule 3  . fluticasone (FLONASE) 50 MCG/ACT nasal spray 1 or 2 sprays each nostril twice a day 16 g 11  . furosemide (LASIX) 40 MG tablet Take 1 tablet (40 mg total) by mouth daily as needed. 30 minutes before food 90 tablet 3  . omeprazole (PRILOSEC) 40 MG capsule Take 1 capsule (40 mg total) by mouth daily. 90 capsule 3  . ondansetron (ZOFRAN ODT) 4 MG disintegrating tablet Take 1 tablet (4 mg total) by mouth every 8 (eight) hours as needed for nausea or vomiting. 20 tablet 0  . cephALEXin (KEFLEX) 500 MG capsule Take 1 capsule (500 mg total) by mouth 2 (two) times daily. 7 capsule 0  . chlorpheniramine-HYDROcodone (TUSSIONEX PENNKINETIC ER) 10-8 MG/5ML SUER Take 5 mLs by mouth at bedtime as needed. 60 mL 0   No facility-administered medications prior to visit.       ROS:  Review of Systems  Constitutional: Negative for fever.  Gastrointestinal: Negative for blood in stool, constipation, diarrhea, nausea and vomiting.  Genitourinary: Positive for dysuria. Negative for dyspareunia, flank pain, frequency, hematuria, urgency, vaginal bleeding, vaginal discharge and vaginal pain.  Musculoskeletal: Negative for back pain.  Skin: Negative for rash.  BREAST: No symptoms   OBJECTIVE:   Vitals:  BP (!) 130/100   Ht 5\' 3"   (1.6 m)   Wt 235 lb 6.4 oz (106.8 kg)   BMI 41.70 kg/m   Physical Exam Vitals signs reviewed.  Constitutional:      Appearance: She is  well-developed.  Neck:     Musculoskeletal: Normal range of motion.  Pulmonary:     Effort: Pulmonary effort is normal.  Genitourinary:    General: Normal vulva.     Pubic Area: No rash.      Labia:        Right: No rash, tenderness or lesion.        Left: No rash, tenderness or lesion.      Urethra: Urethral pain present.     Vagina: Normal. No vaginal discharge, erythema or tenderness.     Cervix: Normal.     Uterus: Normal. Not enlarged and not tender.      Adnexa: Right adnexa normal and left adnexa normal.       Right: No mass or tenderness.         Left: No mass or tenderness.       Comments: URETHRA TENDER TO PALPATE; QUESTION POLYP INSIDE CANAL BUT NOTHING AT EXT OPENING Musculoskeletal: Normal range of motion.  Skin:    General: Skin is warm and dry.  Neurological:     General: No focal deficit present.     Mental Status: She is alert and oriented to person, place, and time.  Psychiatric:        Mood and Affect: Mood normal.        Behavior: Behavior normal.        Thought Content: Thought content normal.        Judgment: Judgment normal.     Results: Results for orders placed or performed in visit on 08/13/18 (from the past 24 hour(s))  POCT Wet Prep with KOH     Status: Normal   Collection Time: 08/13/18 12:07 PM  Result Value Ref Range   Trichomonas, UA Negative    Clue Cells Wet Prep HPF POC neg    Epithelial Wet Prep HPF POC     Yeast Wet Prep HPF POC neg    Bacteria Wet Prep HPF POC     RBC Wet Prep HPF POC     WBC Wet Prep HPF POC     KOH Prep POC Negative Negative  POCT Urinalysis Dipstick     Status: Normal   Collection Time: 08/13/18 12:07 PM  Result Value Ref Range   Color, UA yellow    Clarity, UA clear    Glucose, UA Negative Negative   Bilirubin, UA neg    Ketones, UA neg    Spec Grav, UA 1.020 1.010  - 1.025   Blood, UA neg    pH, UA 5.0 5.0 - 8.0   Protein, UA Negative Negative   Urobilinogen, UA     Nitrite, UA neg    Leukocytes, UA Negative Negative   Appearance     Odor       Assessment/Plan: Dysuria - Plan: Urine Culture, POCT Wet Prep with KOH, POCT Urinalysis Dipstick, Neg UA/wet prep. Check C&S. Will call with results. If neg, question bladder spasm. Try uribel BID to QID (4 samples given) while waiting for C&S results. If neg and sx persist, will refer to urology.  Urinary frequency--May be bladder spasm if C&S neg.  Urethral pain - Plan: may need urology ref prn.    Return if symptoms worsen or fail to improve.  Alicia B. Copland, PA-C 08/13/2018 12:11 PM

## 2018-08-13 ENCOUNTER — Other Ambulatory Visit: Payer: Self-pay

## 2018-08-13 ENCOUNTER — Encounter: Payer: Self-pay | Admitting: Obstetrics and Gynecology

## 2018-08-13 ENCOUNTER — Ambulatory Visit (INDEPENDENT_AMBULATORY_CARE_PROVIDER_SITE_OTHER): Payer: Managed Care, Other (non HMO) | Admitting: Obstetrics and Gynecology

## 2018-08-13 VITALS — BP 130/100 | Ht 63.0 in | Wt 235.4 lb

## 2018-08-13 DIAGNOSIS — R3989 Other symptoms and signs involving the genitourinary system: Secondary | ICD-10-CM | POA: Diagnosis not present

## 2018-08-13 DIAGNOSIS — R300 Dysuria: Secondary | ICD-10-CM | POA: Diagnosis not present

## 2018-08-13 LAB — POCT URINALYSIS DIPSTICK
Bilirubin, UA: NEGATIVE
Blood, UA: NEGATIVE
Glucose, UA: NEGATIVE
Ketones, UA: NEGATIVE
Leukocytes, UA: NEGATIVE
Nitrite, UA: NEGATIVE
Protein, UA: NEGATIVE
Spec Grav, UA: 1.02 (ref 1.010–1.025)
pH, UA: 5 (ref 5.0–8.0)

## 2018-08-13 LAB — POCT WET PREP WITH KOH
Clue Cells Wet Prep HPF POC: NEGATIVE
KOH Prep POC: NEGATIVE
Trichomonas, UA: NEGATIVE
Yeast Wet Prep HPF POC: NEGATIVE

## 2018-08-13 NOTE — Patient Instructions (Signed)
I value your feedback and entrusting us with your care. If you get a [facility] patient survey, I would appreciate you taking the time to let us know about your experience today. Thank you! 

## 2018-08-15 LAB — URINE CULTURE: Organism ID, Bacteria: NO GROWTH

## 2018-08-15 NOTE — Progress Notes (Signed)
Pls let pt know C&S neg. If she is still having sx, then needs urology ref. Thx.

## 2018-09-10 NOTE — Progress Notes (Signed)
PCP: McLean-Scocuzza, Nino Glow, MD   Chief Complaint  Patient presents with  . Gynecologic Exam  . Hot Flashes    HPI:      [name] is a 58 y.o. No obstetric history on file. who LMP was No LMP recorded. Patient has had a hysterectomy., presents today for her annual examination.  Her menses are absent due to hyst due to uterine prolapse. She does not have intermenstrual bleeding. She does have vasomotor sx, somewhat improved with cymbalta in past but really bad now. Pt sometimes has sweat just dripping down forehead. Under increased stress. Pt did hormones in past but had emotional side effects (although I can't find which ones she was on in chart). May be changing to effexor with psych MD.  Sex activity: occas sexually active. She does have vaginal dryness, improved with lubricants.  Last Pap: 06/05/17 Results: no abnormalities/neg HPV DNA Hx of STDs: none  Last mammogram: 06/17/17  Results were: normal--routine follow-up in 12 months There is no FH of breast cancer. There is no FH of ovarian cancer. The patient does do self-breast exams.  Colonoscopy:never. Pt had BRBPR with wiping occas last yr, usually after a hard stool, but no recent sx. She has a hx of anal fissures in the past. Pt promised PCP she would have it done early 2021 and will have PCP do ref.   Tobacco use: The patient denies current or previous tobacco use. Alcohol use: glass of wine with dinner  Drug use: none Exercise: not active  She does not get adequate calcium and Vitamin D in her diet.  Labs with PCP. Pt with significant anxiety/depression not controlled with cymbalta. Psych is suggesting effexor for sx but pt hasn't changed yet.    Past Medical History:  Diagnosis Date  . Allergy   . Anxiety   . Blood in stool   . Cataracts, bilateral    surgery right eye 01/2017   . Chicken pox   . Depression   . GERD (gastroesophageal reflux disease)   . Papilloma of nose    with h/o nose spur   . UTI  (urinary tract infection)     Past Surgical History:  Procedure Laterality Date  . ABDOMINAL HYSTERECTOMY     partial   . APPENDECTOMY    . CATARACT EXTRACTION Right 01/23/2017   right eye only   . cataract surgery     01/2017 [place]  . CHOLECYSTECTOMY    . TONSILLECTOMY    . TONSILLECTOMY    . TOTAL VAGINAL HYSTERECTOMY  2002   due to uterine prolapse  . TUBAL LIGATION      Family History  Problem Relation Age of Onset  . Raynaud syndrome Mother   . Arthritis Mother   . Depression Mother   . Anxiety disorder Mother   . Early death Father        age 27? reason  . Depression Daughter   . Hypertension Son   . Arthritis Maternal Grandmother   . Hypertension Maternal Grandmother   . Miscarriages / Stillbirths Maternal Grandmother   . Depression Maternal Grandfather   . Drug abuse Maternal Grandfather   . Early death Maternal Grandfather   . Heart disease Maternal Grandfather        tachycardia   . Arthritis Paternal Grandmother   . Hypertension Paternal Grandmother   . Early death Paternal Grandfather   . Prostate cancer Maternal Uncle 73  . Prostate cancer Cousin   . Breast  cancer Neg Hx     Social History   Socioeconomic History  . Marital status: Married    Spouse name: Not on file  . Number of children: Not on file  . Years of education: Not on file  . Highest education level: Not on file  Occupational History  . Not on file  Social Needs  . Financial resource strain: Not on file  . Food insecurity    Worry: Not on file    Inability: Not on file  . Transportation needs    Medical: Not on file    Non-medical: Not on file  Tobacco Use  . Smoking status: Former Smoker    Quit date: 04/30/2013    Years since quitting: 5.3  . Smokeless tobacco: Never Used  Substance and Sexual Activity  . Alcohol use: Yes    Alcohol/week: 7.0 standard drinks    Types: 7 Standard drinks or equivalent per week    Comment: daily  . Drug use: No  . Sexual activity: Yes     Birth control/protection: Surgical    Comment: Hysterectomy  Lifestyle  . Physical activity    Days per week: Not on file    Minutes per session: Not on file  . Stress: Not on file  Relationships  . Social Herbalist on phone: Not on file    Gets together: Not on file    Attends religious service: Not on file    Active member of club or organization: Not on file    Attends meetings of clubs or organizations: Not on file    Relationship status: Not on file  . Intimate partner violence    Fear of current or ex partner: Not on file    Emotionally abused: Not on file    Physically abused: Not on file    Forced sexual activity: Not on file  Other Topics Concern  . 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 albuterol (PROVENTIL HFA;VENTOLIN HFA) 108 (90 Base) MCG/ACT inhaler Inhale 2 puffs into the lungs every 6 (six) hours as needed for wheezing or shortness of breath. 1 Inhaler 12  . albuterol (PROVENTIL) (2.5 MG/3ML) 0.083% nebulizer solution Take 3 mLs (2.5 mg total) by nebulization every 6 (six) hours as needed for wheezing or shortness of breath. 75 mL 12  . ALPRAZolam (XANAX) 1 MG tablet taking 0.5 mg bid and 1 mg qhs prn 60 tablet 5  . Cholecalciferol 50000 units capsule Take 1 capsule (50,000 Units total) by mouth once a week. 13 capsule 1  . DULoxetine (CYMBALTA) 60 MG capsule Take 1 capsule (60 mg total) by mouth daily. 90 capsule 3  . fluticasone (FLONASE) 50 MCG/ACT nasal spray 1 or 2 sprays each nostril twice a day 16 g 11  . furosemide (LASIX) 40 MG tablet Take 1 tablet (40 mg total) by mouth daily as needed. 30 minutes before food 90 tablet 3  . omeprazole (PRILOSEC) 40 MG capsule Take 1 capsule (40 mg total) by mouth daily. 90  capsule 3  . ondansetron (ZOFRAN ODT) 4 MG disintegrating tablet Take 1 tablet (4 mg total) by mouth every 8 (eight) hours as needed  for nausea or vomiting. 20 tablet 0   No facility-administered medications prior to visit.       ROS:  Review of Systems  Constitutional: Positive for fatigue. Negative for fever and unexpected weight change.  Respiratory: Negative for cough, shortness of breath and wheezing.   Cardiovascular: Negative for chest pain, palpitations and leg swelling.  Gastrointestinal: Negative for blood in stool, constipation, diarrhea, nausea and vomiting.  Endocrine: Positive for heat intolerance. Negative for cold intolerance and polyuria.  Genitourinary: Negative for dyspareunia, dysuria, flank pain, frequency, genital sores, hematuria, menstrual problem, pelvic pain, urgency, vaginal bleeding, vaginal discharge and vaginal pain.  Musculoskeletal: Negative for back pain, joint swelling and myalgias.  Skin: Negative for rash.  Neurological: Negative for dizziness, syncope, light-headedness, numbness and headaches.  Hematological: Negative for adenopathy.  Psychiatric/Behavioral: Positive for agitation and dysphoric mood. Negative for confusion, sleep disturbance and suicidal ideas. The patient is not nervous/anxious.   BREAST: No symptoms   Objective: BP 120/80   Ht 5\' 3"  (1.6 m)   Wt 234 lb 12.8 oz (106.5 kg)   BMI 41.59 kg/m    Physical Exam Constitutional:      Appearance: She is well-developed.  Genitourinary:     Vulva, vagina, right adnexa, left adnexa and rectum normal.     No vaginal discharge, erythema or tenderness.     Cervix is absent.     Uterus is absent.     No right or left adnexal mass present.     Right adnexa not tender.     Left adnexa not tender.     Genitourinary Comments: UTERUS/CX SURG REM  Rectum:     Guaiac result negative.     No rectal mass, anal fissure, tenderness or external hemorrhoid.  Neck:     Musculoskeletal: Normal  range of motion.     Thyroid: No thyromegaly.  Cardiovascular:     Rate and Rhythm: Normal rate and regular rhythm.     Heart sounds: Normal heart sounds. No murmur.  Pulmonary:     Effort: Pulmonary effort is normal.     Breath sounds: Normal breath sounds.  Chest:     Breasts:        Right: No mass, nipple discharge, skin change or tenderness.        Left: No mass, nipple discharge, skin change or tenderness.  Abdominal:     Palpations: Abdomen is soft.     Tenderness: There is no abdominal tenderness. There is no guarding.  Musculoskeletal: Normal range of motion.  Neurological:     General: No focal deficit present.     Mental Status: She is alert and oriented to person, place, and time.     Cranial Nerves: No cranial nerve deficit.  Skin:    General: Skin is warm and dry.  Psychiatric:        Mood and Affect: Mood normal.        Behavior: Behavior normal.        Thought Content: Thought content normal.        Judgment: Judgment normal.  Vitals signs reviewed.     Results: Results for orders placed or performed in visit on 09/11/18 (from the past 24 hour(s))  POCT Occult Blood Stool     Status: Normal   Collection Time: 09/11/18  9:26 AM  Result Value Ref Range   Fecal Occult Blood, POC Negative Negative   Card #1 Date     Card #2 Fecal Occult Blod, POC  Card #2 Date     Card #3 Fecal Occult Blood, POC     Card #3 Date      Assessment/Plan:  Encounter for annual routine gynecological examination  Screening for breast cancer - Plan: MM 3D SCREEN BREAST BILATERAL--pt to sched annual  Screening for colon cancer - Plan: POCT Occult Blood Stool; neg FOBT. Pt wants to do ref to GI for scr colonoscopy with PCP.  Vasomotor symptoms due to menopause--side effects with HRT in past. Pt changing to effexor in near future and will see if improves vasomotor sx. Cool showers/add exercise/limit alcohol. F/u prn.        GYN counsel breast self exam, mammography screening,  menopause, adequate intake of calcium and vitamin D, diet and exercise    F/U  Return in about 1 year (around 09/11/2019).  Val Schiavo B. Porfiria Heinrich, PA-C 09/11/2018 9:27 AM

## 2018-09-11 ENCOUNTER — Ambulatory Visit (INDEPENDENT_AMBULATORY_CARE_PROVIDER_SITE_OTHER): Payer: Managed Care, Other (non HMO) | Admitting: Obstetrics and Gynecology

## 2018-09-11 ENCOUNTER — Encounter: Payer: Self-pay | Admitting: Obstetrics and Gynecology

## 2018-09-11 ENCOUNTER — Other Ambulatory Visit: Payer: Self-pay

## 2018-09-11 VITALS — BP 120/80 | Ht 63.0 in | Wt 234.8 lb

## 2018-09-11 DIAGNOSIS — Z01419 Encounter for gynecological examination (general) (routine) without abnormal findings: Secondary | ICD-10-CM

## 2018-09-11 DIAGNOSIS — Z1239 Encounter for other screening for malignant neoplasm of breast: Secondary | ICD-10-CM

## 2018-09-11 DIAGNOSIS — N951 Menopausal and female climacteric states: Secondary | ICD-10-CM

## 2018-09-11 DIAGNOSIS — Z1211 Encounter for screening for malignant neoplasm of colon: Secondary | ICD-10-CM | POA: Diagnosis not present

## 2018-09-11 LAB — HEMOCCULT GUIAC POC 1CARD (OFFICE): Fecal Occult Blood, POC: NEGATIVE

## 2018-09-11 NOTE — Patient Instructions (Signed)
I value your feedback and entrusting us with your care. If you get a [facility] patient survey, I would appreciate you taking the time to let us know about your experience today. Thank you! 

## 2018-09-12 ENCOUNTER — Encounter: Payer: Self-pay | Admitting: Internal Medicine

## 2018-09-15 ENCOUNTER — Telehealth: Payer: Self-pay

## 2018-09-15 NOTE — Telephone Encounter (Signed)
Copied from [facility] [id number]. Topic: General - Inquiry >> Sep 15, 2018  3:49 PM Mathis Bud wrote: Reason for CRM: Patient got a call stating that PCP would like to make appt for 9/2 virtual. Patient states that she would like to come in due to her having pending lab orders and she needs them done for her insurance signa. Patient call back [phone] Work [phone]

## 2018-09-17 ENCOUNTER — Ambulatory Visit (INDEPENDENT_AMBULATORY_CARE_PROVIDER_SITE_OTHER): Payer: Managed Care, Other (non HMO) | Admitting: Internal Medicine

## 2018-09-17 ENCOUNTER — Ambulatory Visit: Payer: Managed Care, Other (non HMO) | Admitting: Internal Medicine

## 2018-09-17 ENCOUNTER — Other Ambulatory Visit: Payer: Self-pay

## 2018-09-17 ENCOUNTER — Encounter: Payer: Self-pay | Admitting: Internal Medicine

## 2018-09-17 VITALS — BP 120/80 | Ht 63.0 in | Wt 234.1 lb

## 2018-09-17 DIAGNOSIS — R8290 Unspecified abnormal findings in urine: Secondary | ICD-10-CM

## 2018-09-17 DIAGNOSIS — Z0001 Encounter for general adult medical examination with abnormal findings: Secondary | ICD-10-CM | POA: Diagnosis not present

## 2018-09-17 DIAGNOSIS — K219 Gastro-esophageal reflux disease without esophagitis: Secondary | ICD-10-CM

## 2018-09-17 DIAGNOSIS — F419 Anxiety disorder, unspecified: Secondary | ICD-10-CM

## 2018-09-17 DIAGNOSIS — Z6841 Body Mass Index (BMI) 40.0 and over, adult: Secondary | ICD-10-CM

## 2018-09-17 DIAGNOSIS — R748 Abnormal levels of other serum enzymes: Secondary | ICD-10-CM

## 2018-09-17 DIAGNOSIS — Z1329 Encounter for screening for other suspected endocrine disorder: Secondary | ICD-10-CM

## 2018-09-17 DIAGNOSIS — N951 Menopausal and female climacteric states: Secondary | ICD-10-CM

## 2018-09-17 DIAGNOSIS — R5383 Other fatigue: Secondary | ICD-10-CM | POA: Diagnosis not present

## 2018-09-17 DIAGNOSIS — E559 Vitamin D deficiency, unspecified: Secondary | ICD-10-CM

## 2018-09-17 DIAGNOSIS — R232 Flushing: Secondary | ICD-10-CM | POA: Diagnosis not present

## 2018-09-17 DIAGNOSIS — Z0000 Encounter for general adult medical examination without abnormal findings: Secondary | ICD-10-CM

## 2018-09-17 DIAGNOSIS — R7303 Prediabetes: Secondary | ICD-10-CM

## 2018-09-17 DIAGNOSIS — R809 Proteinuria, unspecified: Secondary | ICD-10-CM

## 2018-09-17 DIAGNOSIS — Z13220 Encounter for screening for lipoid disorders: Secondary | ICD-10-CM

## 2018-09-17 DIAGNOSIS — Z1389 Encounter for screening for other disorder: Secondary | ICD-10-CM

## 2018-09-17 MED ORDER — VENLAFAXINE HCL ER 37.5 MG PO CP24: 37.5000 mg | ORAL_CAPSULE | Freq: Every day | ORAL | 3 refills | Status: DC

## 2018-09-17 MED ORDER — ALPRAZOLAM 1 MG PO TABS: ORAL_TABLET | ORAL | 5 refills | Status: DC

## 2018-09-17 MED ORDER — OMEPRAZOLE 40 MG PO CPDR: 40.0000 mg | DELAYED_RELEASE_CAPSULE | Freq: Every day | ORAL | 3 refills | Status: DC

## 2018-09-17 NOTE — Progress Notes (Signed)
Virtual Visit via Video Note  I connected with Meghan Young  on 09/17/18 at  9:00 AM EDT by a video enabled telemedicine application and verified that I am speaking with the correct person using two identifiers.  Location patient: work Environmental manager or home office Persons participating in the virtual visit: patient, provider  I discussed the limitations of evaluation and management by telemedicine and the availability of in person appointments. The patient expressed understanding and agreed to proceed.   HPI: 1. Annual needs paperwork for [employer] filled out 2. Fatigue + she is sleeping a lot and always has liked to sleep and not sure if related to anxiety. She reports she does snore but but does stop breathing  3. Hot flashes/anxiety she wants to change from cymbalta 60 to effexor as prev disc with me and her ob/gyn at [facility]  4. Anxiety she is more irritable with hot flashes and anxiety worse in the am PHQ 9 score 4     ROS: See pertinent positives and negatives per HPI. General: wt down to 234 lbs  HEENT: no sore throat CV: no chest pain  Lungs: no sob  GI: no ab pain  MSK: no jt pain  Neuro: no h/a  Psych: +anxiety, +irritability, +stress  Skin: no issues  GU: no issues    Past Medical History:  Diagnosis Date  . Allergy   . Anxiety   . Blood in stool   . Cataracts, bilateral    surgery right eye 01/2017   . Chicken pox   . Depression   . GERD (gastroesophageal reflux disease)   . Papilloma of nose    with h/o nose spur   . UTI (urinary tract infection)     Past Surgical History:  Procedure Laterality Date  . ABDOMINAL HYSTERECTOMY     partial   . APPENDECTOMY    . CATARACT EXTRACTION Right 01/23/2017   right eye only   . cataract surgery     01/2017 [place]  . CHOLECYSTECTOMY    . TONSILLECTOMY    . TONSILLECTOMY    . TOTAL VAGINAL HYSTERECTOMY  2002   due to uterine prolapse  . TUBAL LIGATION      Family History  Problem Relation Age of Onset   . Raynaud syndrome Mother   . Arthritis Mother   . Depression Mother   . Anxiety disorder Mother   . Early death Father        age 37? reason  . Depression Daughter   . Hypertension Son   . Arthritis Maternal Grandmother   . Hypertension Maternal Grandmother   . Miscarriages / Stillbirths Maternal Grandmother   . Depression Maternal Grandfather   . Drug abuse Maternal Grandfather   . Early death Maternal Grandfather   . Heart disease Maternal Grandfather        tachycardia   . Arthritis Paternal Grandmother   . Hypertension Paternal Grandmother   . Early death Paternal Grandfather   . Prostate cancer Maternal Uncle 57  . Prostate cancer Cousin   . 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  albuterol (PROVENTIL HFA;VENTOLIN HFA) 108 (90 Base) MCG/ACT inhaler, Inhale 2 puffs into the lungs every 6 (six) hours as needed for wheezing or shortness of breath., Disp: 1  Inhaler, Rfl: 12 .  albuterol (PROVENTIL) (2.5 MG/3ML) 0.083% nebulizer solution, Take 3 mLs (2.5 mg total) by nebulization every 6 (six) hours as needed for wheezing or shortness of breath., Disp: 75 mL, Rfl: 12 .  ALPRAZolam (XANAX) 1 MG tablet, taking 0.5 mg bid and 1 mg qhs prn, Disp: 60 tablet, Rfl: 5 .  b complex vitamins tablet, Take 1 tablet by mouth daily., Disp: , Rfl:  .  Cholecalciferol 50000 units capsule, Take 1 capsule (50,000 Units total) by mouth once a week., Disp: 13 capsule, Rfl: 1 .  fluticasone (FLONASE) 50 MCG/ACT nasal spray, 1 or 2 sprays each nostril twice a day, Disp: 16 g, Rfl: 11 .  furosemide (LASIX) 40 MG tablet, Take 1 tablet (40 mg total) by mouth daily as needed. 30 minutes before food, Disp: 90 tablet, Rfl: 3 .  omeprazole (PRILOSEC) 40 MG capsule, Take 1 capsule (40 mg total) by mouth daily., Disp: 90  capsule, Rfl: 3 .  venlafaxine XR (EFFEXOR XR) 37.5 MG 24 hr capsule, Take 1 capsule (37.5 mg total) by mouth daily with breakfast., Disp: 90 capsule, Rfl: 3  EXAM:  VITALS per patient if applicable:  GENERAL: alert, oriented, appears well and in no acute distress  HEENT: atraumatic, conjunttiva clear, no obvious abnormalities on inspection of external nose and ears  NECK: normal movements of the head and neck  LUNGS: on inspection no signs of respiratory distress, breathing rate appears normal, no obvious gross SOB, gasping or wheezing  CV: no obvious cyanosis  MS: moves all visible extremities without noticeable abnormality  PSYCH/NEURO: pleasant and cooperative, no obvious depression or anxiety, speech and thought processing grossly intact  ASSESSMENT AND PLAN:  Discussed the following assessment and plan:  Annual physical exam - Plan:  Fasting labs [facility] flu shothad 10/23/17 work health dept will get again this year  Tdap had8/21/2018 Disc shingrixdisc prev given info Hep B, MMR, varicella immune HCV and HIV neg 04/09/16   Pap not due per pt f/u [facility] saw 08/2018  Colonoscopy disc today pt wants to wait never had as of 09/17/2018  Mammogram referred [facility] [place] pt to call  rec healthy diet and exercise will fill out work related form  Nodular densities CT chest noted in 2012 consider repeat CT chest  Gastroesophageal reflux disease, esophagitis presence not specified - Plan: omeprazole (PRILOSEC) 40 MG capsule  Anxiety - Plan: ALPRAZolam (XANAX) 1 MG tablet, venlafaxine XR (EFFEXOR XR) 37.5 MG 24 hr capsule, STOP cymbalta 60 mg qd  Referred to [facility]   Hot flashes - Plan: venlafaxine XR (EFFEXOR XR) 37.5 MG 24 hr capsule  Vitamin D deficiency - Plan: Vitamin D (25 hydroxy)  Elevated liver enzymes likely fatty liver if continues to be elevated consider US abdomen   Prediabetes - Plan: Hemoglobin A1c  Fatigue, unspecified type - Plan: Comprehensive  metabolic panel, CBC with Differential/Platelet, TSH, Urinalysis, Routine w reflex microscopic -per pt resumed b complex and vitamin D         I discussed the assessment and treatment plan with the patient. The patient was provided an opportunity to ask questions and all were answered. The patient agreed with the plan and demonstrated an understanding of the instructions.   The patient was advised to call back or seek an in-person evaluation if the symptoms worsen or if the condition fails to improve as anticipated.  Time spent 20 minutes  Delorise Jackson, MD

## 2018-09-17 NOTE — Progress Notes (Signed)
Wants to discuss changing her anti-depressant medications.  PHQ-9 score:  4

## 2018-09-19 ENCOUNTER — Encounter: Payer: Self-pay | Admitting: Internal Medicine

## 2018-09-19 ENCOUNTER — Other Ambulatory Visit: Payer: Self-pay | Admitting: Internal Medicine

## 2018-09-19 DIAGNOSIS — R7989 Other specified abnormal findings of blood chemistry: Secondary | ICD-10-CM

## 2018-09-19 LAB — COMPREHENSIVE METABOLIC PANEL
ALT: 69 IU/L — ABNORMAL HIGH (ref 0–32)
AST: 58 IU/L — ABNORMAL HIGH (ref 0–40)
Albumin/Globulin Ratio: 1.7 (ref 1.2–2.2)
Albumin: 4.7 g/dL (ref 3.8–4.9)
Alkaline Phosphatase: 133 IU/L — ABNORMAL HIGH (ref 39–117)
BUN/Creatinine Ratio: 20 (ref 9–23)
BUN: 15 mg/dL (ref 6–24)
Bilirubin Total: 0.6 mg/dL (ref 0.0–1.2)
CO2: 26 mmol/L (ref 20–29)
Calcium: 9.2 mg/dL (ref 8.7–10.2)
Chloride: 99 mmol/L (ref 96–106)
Creatinine, Ser: 0.76 mg/dL (ref 0.57–1.00)
GFR calc Af Amer: 101 mL/min/{1.73_m2} (ref >59)
GFR calc non Af Amer: 88 mL/min/{1.73_m2} (ref >59)
Globulin, Total: 2.7 g/dL (ref 1.5–4.5)
Glucose: 98 mg/dL (ref 65–99)
Potassium: 3.9 mmol/L (ref 3.5–5.2)
Sodium: 142 mmol/L (ref 134–144)
Total Protein: 7.4 g/dL (ref 6.0–8.5)

## 2018-09-19 LAB — CBC WITH DIFFERENTIAL/PLATELET
Basophils Absolute: 0 10*3/uL (ref 0.0–0.2)
Basos: 0 %
EOS (ABSOLUTE): 0.2 10*3/uL (ref 0.0–0.4)
Eos: 2 %
Hematocrit: 40.7 % (ref 34.0–46.6)
Hemoglobin: 13.7 g/dL (ref 11.1–15.9)
Immature Grans (Abs): 0 10*3/uL (ref 0.0–0.1)
Immature Granulocytes: 0 %
Lymphocytes Absolute: 1.9 10*3/uL (ref 0.7–3.1)
Lymphs: 28 %
MCH: 30.3 pg (ref 26.6–33.0)
MCHC: 33.7 g/dL (ref 31.5–35.7)
MCV: 90 fL (ref 79–97)
Monocytes Absolute: 0.5 10*3/uL (ref 0.1–0.9)
Monocytes: 7 %
Neutrophils Absolute: 4.1 10*3/uL (ref 1.4–7.0)
Neutrophils: 63 %
Platelets: 281 10*3/uL (ref 150–450)
RBC: 4.52 x10E6/uL (ref 3.77–5.28)
RDW: 13.5 % (ref 11.7–15.4)
WBC: 6.7 10*3/uL (ref 3.4–10.8)

## 2018-09-19 LAB — HEMOGLOBIN A1C
Est. average glucose Bld gHb Est-mCnc: 105 mg/dL
Hgb A1c MFr Bld: 5.3 % (ref 4.8–5.6)

## 2018-09-19 LAB — MICROSCOPIC EXAMINATION: Casts: NONE SEEN /lpf

## 2018-09-19 LAB — URINALYSIS, ROUTINE W REFLEX MICROSCOPIC
Bilirubin, UA: NEGATIVE
Glucose, UA: NEGATIVE
Ketones, UA: NEGATIVE
Leukocytes,UA: NEGATIVE
Nitrite, UA: NEGATIVE
RBC, UA: NEGATIVE
Specific Gravity, UA: 1.029 (ref 1.005–1.030)
Urobilinogen, Ur: 1 mg/dL (ref 0.2–1.0)
pH, UA: 6 (ref 5.0–7.5)

## 2018-09-19 LAB — LIPID PANEL
Chol/HDL Ratio: 2.2 ratio (ref 0.0–4.4)
Cholesterol, Total: 183 mg/dL (ref 100–199)
HDL: 82 mg/dL (ref >39)
LDL Chol Calc (NIH): 88 mg/dL (ref 0–99)
Triglycerides: 71 mg/dL (ref 0–149)
VLDL Cholesterol Cal: 13 mg/dL (ref 5–40)

## 2018-09-19 LAB — VITAMIN D 25 HYDROXY (VIT D DEFICIENCY, FRACTURES): Vit D, 25-Hydroxy: 44.4 ng/mL (ref 30.0–100.0)

## 2018-09-19 LAB — TSH: TSH: 2.62 u[IU]/mL (ref 0.450–4.500)

## 2018-09-19 NOTE — Addendum Note (Signed)
Addended by: Orland Mustard on: 09/19/2018 07:52 AM   Modules accepted: Orders

## 2018-09-24 ENCOUNTER — Ambulatory Visit: Payer: Managed Care, Other (non HMO)

## 2018-09-24 ENCOUNTER — Ambulatory Visit: Payer: Self-pay | Admitting: *Deleted

## 2018-09-24 NOTE — Telephone Encounter (Signed)
Call pt  I am covering for Mclean since she is out of office this week.  Yes, since effexor is a bit stimulating, I would not take in the evening; it is normally prescribed as her prescription was to be taken in the morning.  Please also confirm what dose she is on.  Is she completely off the cymbalta?  Does she feel she needs an appt?

## 2018-09-24 NOTE — Telephone Encounter (Signed)
Patient is not talking Lexapro patient is taking Effexor prescribed on 09/17/18 she takes it at night as she has taken other antidepressants but she has started  To feel anxious in the morning when she wakes up. Patient asking if this may be from taking the effexor at night.

## 2018-09-24 NOTE — Telephone Encounter (Signed)
Pt called statng that she feels anxiety in the morning and would like to know if this is normal;  she started to transition from Cymbalta to Lexapro on 09/19/2018; she  the pt says that she takes her medication at night because that is how her previous MD instructed her; reviewed instructions for Lexapro with the pt; the verbalized understanding;  she would like to discuss this with Dr McLean-Scocuzza; the the pt can be contacted at [phone] cell or [phone] work, and a message can be left on voicemail; will route to office for provider review and final disposition.     Reason for Disposition . [1] Caller has URGENT medication question about med that PCP or specialist prescribed AND [2] triager unable to answer question  Answer Assessment - Initial Assessment Questions 1.   NAME of MEDICATION: "What medicine are you calling about?"     Lexapro 2.   QUESTION: "What is your question?"    Is it normal to have anxiety when transitioning medication 3.   PRESCRIBING HCP: "Who prescribed it?" Reason: if prescribed by specialist, call should be referred to that group.    Dr Olivia Mackie McLean-Scocuzza 4. SYMPTOMS: "Do you have any symptoms?"     Anxiety in the morning 5. SEVERITY: If symptoms are present, ask "Are they mild, moderate or severe?"     6.  PREGNANCY:  "Is there any chance that you are pregnant?" "When was your last menstrual period?"  Protocols used: MEDICATION QUESTION CALL-A-AH

## 2018-09-24 NOTE — Telephone Encounter (Signed)
Patient stated that she would try taking in the morning. She is waking up 2-3am feeling anxious, almost fearful with some trembling. She is completely weaned of Cymbalta. She will call back if she does  Not feel any change by taking in the morning.

## 2018-09-24 NOTE — Telephone Encounter (Signed)
noted 

## 2018-09-29 ENCOUNTER — Other Ambulatory Visit: Payer: Self-pay

## 2018-09-29 ENCOUNTER — Encounter: Payer: Self-pay | Admitting: Internal Medicine

## 2018-09-29 ENCOUNTER — Ambulatory Visit
Admission: RE | Admit: 2018-09-29 | Discharge: 2018-09-29 | Disposition: A | Discharge status: Home / Self Care | Payer: Managed Care, Other (non HMO) | Source: Ambulatory Visit | Attending: Internal Medicine | Admitting: Internal Medicine

## 2018-09-29 DIAGNOSIS — R945 Abnormal results of liver function studies: Secondary | ICD-10-CM | POA: Diagnosis present

## 2018-09-29 DIAGNOSIS — R7989 Other specified abnormal findings of blood chemistry: Secondary | ICD-10-CM

## 2018-10-03 ENCOUNTER — Ambulatory Visit (INDEPENDENT_AMBULATORY_CARE_PROVIDER_SITE_OTHER): Payer: Managed Care, Other (non HMO) | Admitting: Internal Medicine

## 2018-10-03 ENCOUNTER — Encounter: Payer: Self-pay | Admitting: Internal Medicine

## 2018-10-03 ENCOUNTER — Other Ambulatory Visit: Payer: Self-pay

## 2018-10-03 DIAGNOSIS — R748 Abnormal levels of other serum enzymes: Secondary | ICD-10-CM

## 2018-10-03 DIAGNOSIS — R232 Flushing: Secondary | ICD-10-CM | POA: Diagnosis not present

## 2018-10-03 DIAGNOSIS — F419 Anxiety disorder, unspecified: Secondary | ICD-10-CM

## 2018-10-03 DIAGNOSIS — F329 Major depressive disorder, single episode, unspecified: Secondary | ICD-10-CM

## 2018-10-03 DIAGNOSIS — Z6841 Body Mass Index (BMI) 40.0 and over, adult: Secondary | ICD-10-CM

## 2018-10-03 NOTE — Progress Notes (Signed)
Telephone Note  I connected with Meghan Young  on 10/03/18 at  2:30 PM EDT by a telephoneand verified that I am speaking with the correct person using two identifiers.  Location patient: work Environmental manager or home office Persons participating in the virtual visit: patient, provider  I discussed the limitations of evaluation and management by telemedicine and the availability of in person appointments. The patient expressed understanding and agreed to proceed.   HPI: 1.f/u hot flashes anxiety and depression after 1st week of stopping cymbalta and changing to effexor pt had increased anxiety/panic and had to take xanax 0.5 mg bid and 1 mg qhs and at times anxiety/depression worse in the am appt with therapy 10/28/2018   She also was having chills in the am which 2% side effect with effexor but night sweats have improved   2. Elevated lfts with [place] + fatty liver or cirrhosis   ROS: See pertinent positives and negatives per HPI.  Past Medical History:  Diagnosis Date  . Allergy   . Anxiety   . Blood in stool   . Cataracts, bilateral    surgery right eye 01/2017   . Chicken pox   . Depression   . GERD (gastroesophageal reflux disease)   . Papilloma of nose    with h/o nose spur   . UTI (urinary tract infection)     Past Surgical History:  Procedure Laterality Date  . ABDOMINAL HYSTERECTOMY     partial   . APPENDECTOMY    . CATARACT EXTRACTION Right 01/23/2017   right eye only   . cataract surgery     01/2017 [place]  . CHOLECYSTECTOMY    . TONSILLECTOMY    . TONSILLECTOMY    . TOTAL VAGINAL HYSTERECTOMY  2002   due to uterine prolapse  . TUBAL LIGATION      Family History  Problem Relation Age of Onset  . Raynaud syndrome Mother   . Arthritis Mother   . Depression Mother   . Anxiety disorder Mother   . Early death Father        age 84? reason  . Depression Daughter   . Hypertension Son   . Arthritis Maternal Grandmother   . Hypertension Maternal  Grandmother   . Miscarriages / Stillbirths Maternal Grandmother   . Depression Maternal Grandfather   . Drug abuse Maternal Grandfather   . Early death Maternal Grandfather   . Heart disease Maternal Grandfather        tachycardia   . Arthritis Paternal Grandmother   . Hypertension Paternal Grandmother   . Early death Paternal Grandfather   . Prostate cancer Maternal Uncle 24  . Prostate cancer Cousin   . 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  albuterol (PROVENTIL HFA;VENTOLIN HFA) 108 (90 Base) MCG/ACT inhaler, Inhale 2 puffs into the lungs every 6 (six) hours as needed for wheezing or shortness of breath., Disp: 1 Inhaler, Rfl: 12 .  albuterol (PROVENTIL) (2.5 MG/3ML) 0.083% nebulizer solution, Take 3 mLs (2.5 mg total) by nebulization every 6 (six) hours as needed for wheezing or shortness of breath., Disp: 75 mL, Rfl: 12 .  ALPRAZolam (XANAX) 1 MG tablet, taking 0.5 mg bid and 1 mg qhs prn, Disp: 60 tablet, Rfl: 5 .  b complex  vitamins tablet, Take 1 tablet by mouth daily., Disp: , Rfl:  .  fluticasone (FLONASE) 50 MCG/ACT nasal spray, 1 or 2 sprays each nostril twice a day, Disp: 16 g, Rfl: 11 .  furosemide (LASIX) 40 MG tablet, Take 1 tablet (40 mg total) by mouth daily as needed. 30 minutes before food, Disp: 90 tablet, Rfl: 3 .  omeprazole (PRILOSEC) 40 MG capsule, Take 1 capsule (40 mg total) by mouth daily., Disp: 90 capsule, Rfl: 3 .  venlafaxine XR (EFFEXOR XR) 37.5 MG 24 hr capsule, Take 1 capsule (37.5 mg total) by mouth daily with breakfast., Disp: 90 capsule, Rfl: 3  EXAM:  VITALS per patient if applicable:  GENERAL: alert, oriented, appears well and in no acute distress  HEENT: atraumatic, conjunttiva clear, no obvious abnormalities on inspection of external nose and  ears  NECK: normal movements of the head and neck  LUNGS: on inspection no signs of respiratory distress, breathing rate appears normal, no obvious gross SOB, gasping or wheezing  CV: no obvious cyanosis  MS: moves all visible extremities without noticeable abnormality  PSYCH/NEURO: pleasant and cooperative, no obvious depression or anxiety, speech and thought processing grossly intact  ASSESSMENT AND PLAN:  Discussed the following assessment and plan:  Anxiety and depression/hot flashes  -f/u therapy 10/2018 Cont effexor 37.5 mg qd  Let me know if 2 weeks if not better and will increase effexor 75  -will do short term paperwork for FML[employer] pt to bring in   Elevated liver enzymes -f/u with GI in future pt not ready   HM flu shothad 10/23/17 work health dept will get 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 year  Tdap had8/21/2018 Disc shingrixdisc prev given info Hep B,MMR, varicellaimmune HCV and HIV neg 04/09/16  rec exercise to lose   Pap not due per pt f/u [facility] saw 08/2018   Colonoscopy disc today pt wants to wait never had as of 09/17/2018   Mammogram [facility] 10/06/18  rec healthy diet and exercise will fill out work related form   Nodular densities CT chest noted in 2012 consider repeat CT chest in future   09/29/2018  -Aorta which is blood artery in abdomen borderline enlarged we need to repeat Ultrasound in 5 years   -we discussed possible serious and likely etiologies, options for evaluation and workup, limitations of telemedicine visit vs in person visit, treatment, treatment risks and precautions. Pt prefers to treat via telemedicine empirically rather then risking or undertaking an in person visit at this moment. Patient agrees to seek prompt in person care if worsening, new symptoms arise, or if is not improving with treatment.   I discussed the assessment and treatment plan with the patient. The patient was provided an opportunity to ask questions and all were answered. The patient  agreed with the plan and demonstrated an understanding of the instructions.   The patient was advised to call back or seek an in-person evaluation if the symptoms worsen or if the condition fails to improve as anticipated.  Time spent 15 minutes  Delorise Jackson, MD

## 2018-10-06 ENCOUNTER — Other Ambulatory Visit: Payer: Self-pay

## 2018-10-06 ENCOUNTER — Ambulatory Visit
Admission: RE | Admit: 2018-10-06 | Discharge: 2018-10-06 | Disposition: A | Discharge status: Home / Self Care | Payer: Managed Care, Other (non HMO) | Source: Ambulatory Visit | Attending: Internal Medicine | Admitting: Internal Medicine

## 2018-10-06 DIAGNOSIS — Z1231 Encounter for screening mammogram for malignant neoplasm of breast: Secondary | ICD-10-CM | POA: Diagnosis present

## 2018-10-13 ENCOUNTER — Other Ambulatory Visit: Payer: Self-pay

## 2018-10-13 ENCOUNTER — Ambulatory Visit (INDEPENDENT_AMBULATORY_CARE_PROVIDER_SITE_OTHER): Payer: Managed Care, Other (non HMO) | Admitting: Family Medicine

## 2018-10-13 ENCOUNTER — Telehealth: Payer: Self-pay | Admitting: Internal Medicine

## 2018-10-13 DIAGNOSIS — T148XXA Other injury of unspecified body region, initial encounter: Secondary | ICD-10-CM

## 2018-10-13 DIAGNOSIS — R21 Rash and other nonspecific skin eruption: Secondary | ICD-10-CM | POA: Diagnosis not present

## 2018-10-13 MED ORDER — TRIAMCINOLONE ACETONIDE 0.1 % EX CREA: 1.0000 "application " | TOPICAL_CREAM | Freq: Two times a day (BID) | CUTANEOUS | 0 refills | Status: DC

## 2018-10-13 MED ORDER — PREDNISONE 10 MG (21) PO TBPK: ORAL_TABLET | ORAL | 0 refills | Status: DC

## 2018-10-13 NOTE — Telephone Encounter (Signed)
Patient has watery blisters on her hands, arm and right ankle.  Please advise.

## 2018-10-13 NOTE — Telephone Encounter (Signed)
Left message for patient to return call back. PEC may give information. Needs to be scheduled with lauren today.

## 2018-10-13 NOTE — Telephone Encounter (Signed)
Pt will be screened at checkin, since she wasn't screened when appt was made.

## 2018-10-13 NOTE — Telephone Encounter (Signed)
Pt has been scheduled for the 4 pm appt time today w/ Lauren. Unable to reach the office.  Please call to screen pt if need, thank you.

## 2018-10-13 NOTE — Progress Notes (Signed)
Patient ID: Meghan Young, female   DOB: 1960-09-23, 58 y.o.   MRN: [id number]    Virtual Visit via video/phone Note  This visit type was conducted due to national recommendations for restrictions regarding the COVID-19 pandemic (e.g. social distancing).  This format is felt to be most appropriate for this patient at this time.  All issues noted in this document were discussed and addressed.  No physical exam was performed (except for noted visual exam findings with Video Visits).   I connected with Meghan Young today at  4:00 PM EDT by a video enabled telemedicine application or telephone and verified that I am speaking with the correct person using two identifiers. Location patient: home Location provider: work or home office Persons participating in the virtual visit: patient, provider  I discussed the limitations, risks, security and privacy concerns of performing an evaluation and management service by telephone and the availability of in person appointments. I also discussed with the patient that there may be a patient responsible charge related to this service. The patient expressed understanding and agreed to proceed.  Interactive audio and video telecommunications were attempted between this provider and patient, however failed, due to patient having technical difficulties. Visit finished over phone due to video issues.   HPI:  Patient and I connected via video, video connection unsuccessful so we finish visit over phone due to complaints of some blisters on back of hands tops of feet and some scattered on forearms.  The blisters are described as small, slightly raised and red.  Areas are a little itchy.  Denies any new soaps, lotions detergents, places or foods.  Does not describe blisters as painful.  They are not oozing any fluid or pus.  No fever or chills.  No nausea, vomiting or diarrhea.  No cough.  No body aches.  Patient states she did get a new hand sanitizer, possibly related  to that but unsure of how that would have gotten on her feet.    ROS: See pertinent positives and negatives per HPI.  Past Medical History:  Diagnosis Date  . Allergy   . Anxiety   . Blood in stool   . Cataracts, bilateral    surgery right eye 01/2017   . Chicken pox   . Depression   . GERD (gastroesophageal reflux disease)   . Papilloma of nose    with h/o nose spur   . UTI (urinary tract infection)     Past Surgical History:  Procedure Laterality Date  . ABDOMINAL HYSTERECTOMY     partial   . APPENDECTOMY    . CATARACT EXTRACTION Right 01/23/2017   right eye only   . cataract surgery     01/2017 [place]  . CHOLECYSTECTOMY    . TONSILLECTOMY    . TONSILLECTOMY    . TOTAL VAGINAL HYSTERECTOMY  2002   due to uterine prolapse  . TUBAL LIGATION      Family History  Problem Relation Age of Onset  . Raynaud syndrome Mother   . Arthritis Mother   . Depression Mother   . Anxiety disorder Mother   . Early death Father        age 78? reason  . Depression Daughter   . Hypertension Son   . Arthritis Maternal Grandmother   . Hypertension Maternal Grandmother   . Miscarriages / Stillbirths Maternal Grandmother   . Depression Maternal Grandfather   . Drug abuse Maternal Grandfather   . Early death Maternal Grandfather   .  Heart disease Maternal Grandfather        tachycardia   . Arthritis Paternal Grandmother   . Hypertension Paternal Grandmother   . Early death Paternal Grandfather   . Prostate cancer Maternal Uncle 26  . Prostate cancer Cousin   . Breast cancer Neg Hx     Social History   Tobacco Use  . Smoking status: Former Smoker    Quit date: 04/30/2013    Years since quitting: 5.4  . Smokeless tobacco: Never Used  Substance Use Topics  . Alcohol use: Yes    Alcohol/week: 7.0 standard drinks    Types: 7 Standard drinks or equivalent per week    Comment: daily    Current Outpatient Medications:  .  albuterol (PROVENTIL HFA;VENTOLIN HFA) 108 (90 Base)  MCG/ACT inhaler, Inhale 2 puffs into the lungs every 6 (six) hours as needed for wheezing or shortness of breath., Disp: 1 Inhaler, Rfl: 12 .  albuterol (PROVENTIL) (2.5 MG/3ML) 0.083% nebulizer solution, Take 3 mLs (2.5 mg total) by nebulization every 6 (six) hours as needed for wheezing or shortness of breath., Disp: 75 mL, Rfl: 12 .  ALPRAZolam (XANAX) 1 MG tablet, taking 0.5 mg bid and 1 mg qhs prn, Disp: 60 tablet, Rfl: 5 .  b complex vitamins tablet, Take 1 tablet by mouth daily., Disp: , Rfl:  .  fluticasone (FLONASE) 50 MCG/ACT nasal spray, 1 or 2 sprays each nostril twice a day, Disp: 16 g, Rfl: 11 .  furosemide (LASIX) 40 MG tablet, Take 1 tablet (40 mg total) by mouth daily as needed. 30 minutes before food, Disp: 90 tablet, Rfl: 3 .  omeprazole (PRILOSEC) 40 MG capsule, Take 1 capsule (40 mg total) by mouth daily., Disp: 90 capsule, Rfl: 3 .  venlafaxine XR (EFFEXOR XR) 37.5 MG 24 hr capsule, Take 1 capsule (37.5 mg total) by mouth daily with breakfast., Disp: 90 capsule, Rfl: 3  EXAM:  GENERAL: alert, oriented, sounds well and in no acute distress  LUNGS: Speaking in full sentences, no signs of respiratory distress, breathing rate appears normal, no obvious gross SOB, gasping or wheezing  PSYCH/NEURO: pleasant and cooperative, no obvious depression or anxiety, speech and thought processing grossly intact  ASSESSMENT AND PLAN:  Discussed the following assessment and plan:  Patient will take oral steroid taper to calm down rash/blisters.  Unclear reason for the rash and blisters.  We will treat with the steroid taper in addition to topical steroid cream to calm.  Advised to monitor for any worsening of the rash and monitor for any products such as soaps or lotions that seem to be irritating skin.  Discussed that if skin does not improve, we can consider referral to dermatology.  1. Blister  - predniSONE (STERAPRED UNI-PAK 21 TAB) 10 MG (21) TBPK tablet; Take according to pack  instructions  Dispense: 21 tablet; Refill: 0 - triamcinolone cream (KENALOG) 0.1 %; Apply 1 application topically 2 (two) times daily. Apply thin layer to affected areas.  Dispense: 30 g; Refill: 0  2. Rash and other nonspecific skin eruption  - predniSONE (STERAPRED UNI-PAK 21 TAB) 10 MG (21) TBPK tablet; Take according to pack instructions  Dispense: 21 tablet; Refill: 0 - triamcinolone cream (KENALOG) 0.1 %; Apply 1 application topically 2 (two) times daily. Apply thin layer to affected areas.  Dispense: 30 g; Refill: 0    I discussed the assessment and treatment plan with the patient. The patient was provided an opportunity to ask questions and all were  answered. The patient agreed with the plan and demonstrated an understanding of the instructions.   The patient was advised to call back or seek an in-person evaluation if the symptoms worsen or if the condition fails to improve as anticipated.  I provided 15 minutes of non-face-to-face time during this encounter.   Jodelle Green, FNP

## 2018-10-14 ENCOUNTER — Telehealth: Payer: Self-pay | Admitting: Internal Medicine

## 2018-10-14 NOTE — Telephone Encounter (Signed)
Paperwork given to PCP.  

## 2018-10-14 NOTE — Telephone Encounter (Signed)
Pt dropped off FMLA paper work. Pt would like a call once completed to pick up. Paper work is up front in [employer]. Thank you!

## 2018-10-15 ENCOUNTER — Encounter: Payer: Self-pay | Admitting: Internal Medicine

## 2018-10-15 ENCOUNTER — Other Ambulatory Visit: Payer: Self-pay | Admitting: Internal Medicine

## 2018-10-15 DIAGNOSIS — F419 Anxiety disorder, unspecified: Secondary | ICD-10-CM

## 2018-10-15 DIAGNOSIS — R232 Flushing: Secondary | ICD-10-CM

## 2018-10-15 MED ORDER — VENLAFAXINE HCL ER 75 MG PO CP24: 75.0000 mg | ORAL_CAPSULE | Freq: Every day | ORAL | 3 refills | Status: DC

## 2018-10-19 ENCOUNTER — Encounter: Payer: Self-pay | Admitting: Internal Medicine

## 2018-10-20 ENCOUNTER — Encounter: Payer: Self-pay | Admitting: Internal Medicine

## 2018-10-27 DIAGNOSIS — Z0279 Encounter for issue of other medical certificate: Secondary | ICD-10-CM

## 2018-10-28 ENCOUNTER — Ambulatory Visit (INDEPENDENT_AMBULATORY_CARE_PROVIDER_SITE_OTHER): Payer: 59 | Admitting: Psychology

## 2018-10-28 DIAGNOSIS — F3289 Other specified depressive episodes: Secondary | ICD-10-CM | POA: Diagnosis not present

## 2018-10-28 DIAGNOSIS — F418 Other specified anxiety disorders: Secondary | ICD-10-CM

## 2018-11-11 ENCOUNTER — Other Ambulatory Visit: Payer: Self-pay

## 2018-11-11 ENCOUNTER — Encounter: Payer: Self-pay | Admitting: Family Medicine

## 2018-11-11 ENCOUNTER — Ambulatory Visit (INDEPENDENT_AMBULATORY_CARE_PROVIDER_SITE_OTHER): Payer: Managed Care, Other (non HMO) | Admitting: Family Medicine

## 2018-11-11 VITALS — Ht 63.0 in | Wt 234.0 lb

## 2018-11-11 DIAGNOSIS — Z20828 Contact with and (suspected) exposure to other viral communicable diseases: Secondary | ICD-10-CM | POA: Diagnosis not present

## 2018-11-11 DIAGNOSIS — J029 Acute pharyngitis, unspecified: Secondary | ICD-10-CM | POA: Diagnosis not present

## 2018-11-11 DIAGNOSIS — Z20822 Contact with and (suspected) exposure to covid-19: Secondary | ICD-10-CM

## 2018-11-11 DIAGNOSIS — R05 Cough: Secondary | ICD-10-CM | POA: Diagnosis not present

## 2018-11-11 DIAGNOSIS — R059 Cough, unspecified: Secondary | ICD-10-CM

## 2018-11-11 MED ORDER — HYDROCOD POLST-CPM POLST ER 10-8 MG/5ML PO SUER: 5.0000 mL | Freq: Two times a day (BID) | ORAL | 0 refills | Status: DC | PRN

## 2018-11-11 NOTE — Progress Notes (Signed)
Patient ID: Meghan Young, female   DOB: Nov 29, 1960, 58 y.o.   MRN: [id number]    Virtual Visit via video note  This visit type was conducted due to national recommendations for restrictions regarding the COVID-19 pandemic (e.g. social distancing).  This format is felt to be most appropriate for this patient at this time.  All issues noted in this document were discussed and addressed.  No physical exam was performed (except for noted visual exam findings with Video Visits).   I connected with Delorise Shiner today at 10:40 AM EDT by a video enabled telemedicine application or telephone and verified that I am speaking with the correct person using two identifiers. Location patient: home Location provider: work or home office Persons participating in the virtual visit: patient, provider  I discussed the limitations, risks, security and privacy concerns of performing an evaluation and management service by  and the video availability of in person appointments. I also discussed with the patient that there may be a patient responsible charge related to this service. The patient expressed understanding and agreed to proceed.  HPI:  Patient and I connected via video due to complaint of sore throat, nasal congestion cough for 2 days.  Patient denies being around anyone else has been sick.  Patient states she gets this often and usually gets treated with a Z-Pak and cough medication.  Denies fever chills.  Denies body aches.  Denies shortness breath or wheezing.  Denies coughing up thick discolored phlegm or having thick discolored nasal drainage.  Has taken Advil Cold and Sinus over-the-counter to help treat symptoms with minimal effect.   ROS: See pertinent positives and negatives per HPI.  Past Medical History:  Diagnosis Date  . Allergy   . Anxiety   . Blood in stool   . Cataracts, bilateral    surgery right eye 01/2017   . Chicken pox   . Depression   . GERD (gastroesophageal reflux  disease)   . Papilloma of nose    with h/o nose spur   . UTI (urinary tract infection)     Past Surgical History:  Procedure Laterality Date  . ABDOMINAL HYSTERECTOMY     partial   . APPENDECTOMY    . CATARACT EXTRACTION Right 01/23/2017   right eye only   . cataract surgery     01/2017 [place]  . CHOLECYSTECTOMY    . TONSILLECTOMY    . TONSILLECTOMY    . TOTAL VAGINAL HYSTERECTOMY  2002   due to uterine prolapse  . TUBAL LIGATION      Family History  Problem Relation Age of Onset  . Raynaud syndrome Mother   . Arthritis Mother   . Depression Mother   . Anxiety disorder Mother   . Early death Father        age 43? reason  . Depression Daughter   . Hypertension Son   . Arthritis Maternal Grandmother   . Hypertension Maternal Grandmother   . Miscarriages / Stillbirths Maternal Grandmother   . Depression Maternal Grandfather   . Drug abuse Maternal Grandfather   . Early death Maternal Grandfather   . Heart disease Maternal Grandfather        tachycardia   . Arthritis Paternal Grandmother   . Hypertension Paternal Grandmother   . Early death Paternal Grandfather   . Prostate cancer Maternal Uncle 68  . Prostate cancer Cousin   . Breast cancer Neg Hx    Social History   Tobacco Use  . Smoking  status: Former Smoker    Quit date: 04/30/2013    Years since quitting: 5.5  . Smokeless tobacco: Never Used  Substance Use Topics  . Alcohol use: Yes    Alcohol/week: 7.0 standard drinks    Types: 7 Standard drinks or equivalent per week    Comment: daily    Current Outpatient Medications:  .  albuterol (PROVENTIL HFA;VENTOLIN HFA) 108 (90 Base) MCG/ACT inhaler, Inhale 2 puffs into the lungs every 6 (six) hours as needed for wheezing or shortness of breath., Disp: 1 Inhaler, Rfl: 12 .  albuterol (PROVENTIL) (2.5 MG/3ML) 0.083% nebulizer solution, Take 3 mLs (2.5 mg total) by nebulization every 6 (six) hours as needed for wheezing or shortness of breath., Disp: 75 mL, Rfl: 12  .  ALPRAZolam (XANAX) 1 MG tablet, taking 0.5 mg bid and 1 mg qhs prn, Disp: 60 tablet, Rfl: 5 .  b complex vitamins tablet, Take 1 tablet by mouth daily., Disp: , Rfl:  .  fluticasone (FLONASE) 50 MCG/ACT nasal spray, 1 or 2 sprays each nostril twice a day, Disp: 16 g, Rfl: 11 .  furosemide (LASIX) 40 MG tablet, Take 1 tablet (40 mg total) by mouth daily as needed. 30 minutes before food, Disp: 90 tablet, Rfl: 3 .  omeprazole (PRILOSEC) 40 MG capsule, Take 1 capsule (40 mg total) by mouth daily., Disp: 90 capsule, Rfl: 3 .  venlafaxine XR (EFFEXOR-XR) 75 MG 24 hr capsule, Take 1 capsule (75 mg total) by mouth daily with breakfast., Disp: 90 capsule, Rfl: 3  EXAM:  GENERAL: alert, oriented, appears well and in no acute distress  HEENT: atraumatic, conjunttiva clear, no obvious abnormalities on inspection of external nose and ears  NECK: normal movements of the head and neck  LUNGS: on inspection no signs of respiratory distress, breathing rate appears normal, no obvious gross SOB, gasping or wheezing  CV: no obvious cyanosis  MS: moves all visible extremities without noticeable abnormality  PSYCH/NEURO: pleasant and cooperative, no obvious depression or anxiety, speech and thought processing grossly intact  ASSESSMENT AND PLAN:  Discussed the following assessment and plan:  Suspected COVID-19 virus infection, sore throat, cough - advised that due to symptoms, we need to get patient set up for COVID-19 testing.  Patient advised that I will put order in and he can go to testing location for for drive-through testing. Patient given the address of testing site.  Patient advised that testing is taking 2 to 7 days to result, and while we are awaiting results patient must remain under self quarantine and monitor for any changing/worsening symptoms.  Advised over-the-counter medications such as Tylenol can be used to help treat pain or fevers, Robitussin can be used to help calm cough, allergy  medication such as Claritin or Allegra can help reduce congestion.  Also discussed getting plenty of rest and increasing fluid intake.  Made patient aware that test results as well as how his symptoms progress will determine when the self quarantine will be able to end.  Also advised to monitor self for any worsening symptoms, advised if severe shortness of breath develops, high fever that is not reduced with use of Tylenol, chest pain, severe vomiting or diarrhea  --patient must call on-call and or go to ER right away for evaluation. patient verbalized understanding of these instructions.  Advised patient that 2 days of sore throat & sinus congestion did not warrant an antibiotic treatment, her symptoms are most likely viral in nature.  If her COVID-19 testing is  negative, I suspect some sort of viral illness such as a common cold, viral URI or exacerbation of seasonal allergies   I discussed the assessment and treatment plan with the patient. The patient was provided an opportunity to ask questions and all were answered. The patient agreed with the plan and demonstrated an understanding of the instructions.   The patient was advised to call back or seek an in-person evaluation if the symptoms worsen or if the condition fails to improve as anticipated.   Jodelle Green, FNP

## 2018-11-12 ENCOUNTER — Other Ambulatory Visit: Payer: Self-pay | Admitting: Internal Medicine

## 2018-11-12 DIAGNOSIS — K219 Gastro-esophageal reflux disease without esophagitis: Secondary | ICD-10-CM

## 2018-11-12 MED ORDER — OMEPRAZOLE 40 MG PO CPDR: 40.0000 mg | DELAYED_RELEASE_CAPSULE | Freq: Every day | ORAL | 3 refills | Status: AC

## 2018-11-13 ENCOUNTER — Ambulatory Visit (INDEPENDENT_AMBULATORY_CARE_PROVIDER_SITE_OTHER): Payer: 59 | Admitting: Psychology

## 2018-11-13 DIAGNOSIS — F3289 Other specified depressive episodes: Secondary | ICD-10-CM | POA: Diagnosis not present

## 2018-11-13 LAB — NOVEL CORONAVIRUS, NAA: SARS-CoV-2, NAA: NOT DETECTED

## 2018-11-17 ENCOUNTER — Encounter: Payer: Self-pay | Admitting: Family Medicine

## 2018-11-17 ENCOUNTER — Telehealth: Payer: Self-pay | Admitting: *Deleted

## 2018-11-17 NOTE — Telephone Encounter (Signed)
Can you address this looks like you talked to pt 11/11/18   Thanks [place]

## 2018-11-17 NOTE — Telephone Encounter (Signed)
Yes note is OK

## 2018-11-17 NOTE — Telephone Encounter (Signed)
Patient said that she is feeling better and that she only has a croup cough in the morning that goes away during the day.  Patient said that this croup cough is something that she normally has this time of year.  Patient would like to return to work tomorrow and is ok with work note being sent to [employer].  Is it ok to do note?

## 2018-11-17 NOTE — Telephone Encounter (Signed)
Please confirm no symptoms any more  COVID test is negative  If she is not having symptoms, can be released back to work  Please do note for her to go back if symptoms resolved

## 2018-11-17 NOTE — Telephone Encounter (Signed)
Copied from [place] [id number]. Topic: General - Other >> Nov 17, 2018  8:21 AM Burchel, Abbi R wrote: Reason for CRM: Pt states she needs a return to work release, and that she has spoken with Dr Aundra Dubin about this previously.  Please call pt to discuss.    Pt: [phone]

## 2018-11-17 NOTE — Telephone Encounter (Signed)
COVID Return to Work note sent through [employer].

## 2018-11-18 ENCOUNTER — Other Ambulatory Visit: Payer: Self-pay | Admitting: Internal Medicine

## 2018-11-18 DIAGNOSIS — R600 Localized edema: Secondary | ICD-10-CM

## 2018-11-18 MED ORDER — FUROSEMIDE 40 MG PO TABS: 40.0000 mg | ORAL_TABLET | Freq: Every day | ORAL | 3 refills | Status: DC | PRN

## 2018-11-18 MED ORDER — FUROSEMIDE 40 MG PO TABS: 40.0000 mg | ORAL_TABLET | Freq: Every day | ORAL | 3 refills | Status: AC | PRN

## 2018-11-25 ENCOUNTER — Ambulatory Visit: Payer: 59 | Admitting: Psychology

## 2019-02-09 ENCOUNTER — Telehealth: Payer: Self-pay | Admitting: Internal Medicine

## 2019-02-09 NOTE — Telephone Encounter (Signed)
Can discuss at appt options for blood pressure treatment  She can come into office as long as passes covid 19 screening   We will need to start her on BP medication   [place]

## 2019-02-09 NOTE — Telephone Encounter (Signed)
Patient wants to come into office for her high bp. If screened can she come into office? Please let me know. Thanks

## 2019-02-09 NOTE — Telephone Encounter (Signed)
Patient had eye surgery last week, BP has been running high since last Monday, 02/02/2019. Patient's pulse this morning is 106, and BP 164/108. Patient is at work and did not want to go to Urgent Care. She wanted appt with Dr. Olivia Mackie, Wednesday 02/11/2019.

## 2019-02-11 ENCOUNTER — Encounter: Payer: Self-pay | Admitting: Internal Medicine

## 2019-02-11 ENCOUNTER — Ambulatory Visit (INDEPENDENT_AMBULATORY_CARE_PROVIDER_SITE_OTHER): Payer: Managed Care, Other (non HMO) | Admitting: Internal Medicine

## 2019-02-11 ENCOUNTER — Other Ambulatory Visit: Payer: Self-pay

## 2019-02-11 VITALS — BP 156/94 | Ht 63.0 in | Wt 225.0 lb

## 2019-02-11 DIAGNOSIS — F419 Anxiety disorder, unspecified: Secondary | ICD-10-CM | POA: Diagnosis not present

## 2019-02-11 DIAGNOSIS — R232 Flushing: Secondary | ICD-10-CM | POA: Diagnosis not present

## 2019-02-11 DIAGNOSIS — I10 Essential (primary) hypertension: Secondary | ICD-10-CM | POA: Diagnosis not present

## 2019-02-11 MED ORDER — VENLAFAXINE HCL ER 150 MG PO CP24: 150.0000 mg | ORAL_CAPSULE | Freq: Every day | ORAL | 3 refills | Status: AC

## 2019-02-11 MED ORDER — OLMESARTAN MEDOXOMIL 20 MG PO TABS: 20.0000 mg | ORAL_TABLET | Freq: Every day | ORAL | 3 refills | Status: DC

## 2019-02-11 NOTE — Patient Instructions (Addendum)
Beets can help blood pressure and hibiscus tea without sugar try this   Low-Sodium Eating Plan Sodium, which is an element that makes up salt, helps you maintain a healthy balance of fluids in your body. Too much sodium can increase your blood pressure and cause fluid and waste to be held in your body. Your health care provider or dietitian may recommend following this plan if you have high blood pressure (hypertension), kidney disease, liver disease, or heart failure. Eating less sodium can help lower your blood pressure, reduce swelling, and protect your heart, liver, and kidneys. What are tips for following this plan? General guidelines  Most people on this plan should limit their sodium intake to 1,500-2,000 mg (milligrams) of sodium each day. Reading food labels   The Nutrition Facts label lists the amount of sodium in one serving of the food. If you eat more than one serving, you must multiply the listed amount of sodium by the number of servings.  Choose foods with less than 140 mg of sodium per serving.  Avoid foods with 300 mg of sodium or more per serving. Shopping  Look for lower-sodium products, often labeled as "low-sodium" or "no salt added."  Always check the sodium content even if foods are labeled as "unsalted" or "no salt added".  Buy fresh foods. ? Avoid canned foods and premade or frozen meals. ? Avoid canned, cured, or processed meats  Buy breads that have less than 80 mg of sodium per slice. Cooking  Eat more home-cooked food and less restaurant, buffet, and fast food.  Avoid adding salt when cooking. Use salt-free seasonings or herbs instead of table salt or sea salt. Check with your health care provider or pharmacist before using salt substitutes.  Cook with plant-based oils, such as canola, sunflower, or olive oil. Meal planning  When eating at a restaurant, ask that your food be prepared with less salt or no salt, if possible.  Avoid foods that contain  MSG (monosodium glutamate). MSG is sometimes added to [place] food, bouillon, and some canned foods. What foods are recommended? The items listed may not be a complete list. Talk with your dietitian about what dietary choices are best for you. Grains Low-sodium cereals, including oats, puffed wheat and rice, and shredded wheat. Low-sodium crackers. Unsalted rice. Unsalted pasta. Low-sodium bread. Whole-grain breads and whole-grain pasta. Vegetables Fresh or frozen vegetables. "No salt added" canned vegetables. "No salt added" tomato sauce and paste. Low-sodium or reduced-sodium tomato and vegetable juice. Fruits Fresh, frozen, or canned fruit. Fruit juice. Meats and other protein foods Fresh or frozen (no salt added) meat, poultry, seafood, and fish. Low-sodium canned tuna and salmon. Unsalted nuts. Dried peas, beans, and lentils without added salt. Unsalted canned beans. Eggs. Unsalted nut butters. Dairy Milk. Soy milk. Cheese that is naturally low in sodium, such as ricotta cheese, fresh mozzarella, or Swiss cheese Low-sodium or reduced-sodium cheese. Cream cheese. Yogurt. Fats and oils Unsalted butter. Unsalted margarine with no trans fat. Vegetable oils such as canola or olive oils. Seasonings and other foods Fresh and dried herbs and spices. Salt-free seasonings. Low-sodium mustard and ketchup. Sodium-free salad dressing. Sodium-free light mayonnaise. Fresh or refrigerated horseradish. Lemon juice. Vinegar. Homemade, reduced-sodium, or low-sodium soups. Unsalted popcorn and pretzels. Low-salt or salt-free chips. What foods are not recommended? The items listed may not be a complete list. Talk with your dietitian about what dietary choices are best for you. Grains Instant hot cereals. Bread stuffing, pancake, and biscuit mixes. Croutons. Seasoned rice  or pasta mixes. Noodle soup cups. Boxed or frozen macaroni and cheese. Regular salted crackers. Self-rising flour. Vegetables Sauerkraut,  pickled vegetables, and relishes. Olives. [place] fries. Onion rings. Regular canned vegetables (not low-sodium or reduced-sodium). Regular canned tomato sauce and paste (not low-sodium or reduced-sodium). Regular tomato and vegetable juice (not low-sodium or reduced-sodium). Frozen vegetables in sauces. Meats and other protein foods Meat or fish that is salted, canned, smoked, spiced, or pickled. Bacon, ham, sausage, hotdogs, corned beef, chipped beef, packaged lunch meats, salt pork, jerky, pickled herring, anchovies, regular canned tuna, sardines, salted nuts. Dairy Processed cheese and cheese spreads. Cheese curds. Blue cheese. Feta cheese. String cheese. Regular cottage cheese. Buttermilk. Canned milk. Fats and oils Salted butter. Regular margarine. Ghee. Bacon fat. Seasonings and other foods Onion salt, garlic salt, seasoned salt, table salt, and sea salt. Canned and packaged gravies. Worcestershire sauce. Tartar sauce. Barbecue sauce. Teriyaki sauce. Soy sauce, including reduced-sodium. Steak sauce. Fish sauce. Oyster sauce. Cocktail sauce. Horseradish that you find on the shelf. Regular ketchup and mustard. Meat flavorings and tenderizers. Bouillon cubes. Hot sauce and Tabasco sauce. Premade or packaged marinades. Premade or packaged taco seasonings. Relishes. Regular salad dressings. Salsa. Potato and tortilla chips. Corn chips and puffs. Salted popcorn and pretzels. Canned or dried soups. Pizza. Frozen entrees and pot pies. Summary  Eating less sodium can help lower your blood pressure, reduce swelling, and protect your heart, liver, and kidneys.  Most people on this plan should limit their sodium intake to 1,500-2,000 mg (milligrams) of sodium each day.  Canned, boxed, and frozen foods are high in sodium. Restaurant foods, fast foods, and pizza are also very high in sodium. You also get sodium by adding salt to food.  Try to cook at home, eat more fresh fruits and vegetables, and eat less  fast food, canned, processed, or prepared foods. This information is not intended to replace advice given to you by your health care provider. Make sure you discuss any questions you have with your health care provider. Document Revised: 12/14/2016 Document Reviewed: 12/26/2015 Elsevier Patient Education  [place] DASH stands for "Dietary Approaches to Stop Hypertension." The DASH eating plan is a healthy eating plan that has been shown to reduce high blood pressure (hypertension). It may also reduce your risk for type 2 diabetes, heart disease, and stroke. The DASH eating plan may also help with weight loss. What are tips for following this plan?  General guidelines  Avoid eating more than 2,300 mg (milligrams) of salt (sodium) a day. If you have hypertension, you may need to reduce your sodium intake to 1,500 mg a day.  Limit alcohol intake to no more than 1 drink a day for nonpregnant women and 2 drinks a day for men. One drink equals 12 oz of beer, 5 oz of wine, or 1 oz of hard liquor.  Work with your health care provider to maintain a healthy body weight or to lose weight. Ask what an ideal weight is for you.  Get at least 30 minutes of exercise that causes your heart to beat faster (aerobic exercise) most days of the week. Activities may include walking, swimming, or biking.  Work with your health care provider or diet and nutrition specialist (dietitian) to adjust your eating plan to your individual calorie needs. Reading food labels   Check food labels for the amount of sodium per serving. Choose foods with less than 5 percent of the Daily Value of sodium. Generally,  foods with less than 300 mg of sodium per serving fit into this eating plan.  To find whole grains, look for the word "whole" as the first word in the ingredient list. Shopping  Buy products labeled as "low-sodium" or "no salt added."  Buy fresh foods. Avoid canned foods and premade or  frozen meals. Cooking  Avoid adding salt when cooking. Use salt-free seasonings or herbs instead of table salt or sea salt. Check with your health care provider or pharmacist before using salt substitutes.  Do not fry foods. Cook foods using healthy methods such as baking, boiling, grilling, and broiling instead.  Cook with heart-healthy oils, such as olive, canola, soybean, or sunflower oil. Meal planning  Eat a balanced diet that includes: ? 5 or more servings of fruits and vegetables each day. At each meal, try to fill half of your plate with fruits and vegetables. ? Up to 6-8 servings of whole grains each day. ? Less than 6 oz of lean meat, poultry, or fish each day. A 3-oz serving of meat is about the same size as a deck of cards. One egg equals 1 oz. ? 2 servings of low-fat dairy each day. ? A serving of nuts, seeds, or beans 5 times each week. ? Heart-healthy fats. Healthy fats called Omega-3 fatty acids are found in foods such as flaxseeds and coldwater fish, like sardines, salmon, and mackerel.  Limit how much you eat of the following: ? Canned or prepackaged foods. ? Food that is high in trans fat, such as fried foods. ? Food that is high in saturated fat, such as fatty meat. ? Sweets, desserts, sugary drinks, and other foods with added sugar. ? Full-fat dairy products.  Do not salt foods before eating.  Try to eat at least 2 vegetarian meals each week.  Eat more home-cooked food and less restaurant, buffet, and fast food.  When eating at a restaurant, ask that your food be prepared with less salt or no salt, if possible. What foods are recommended? The items listed may not be a complete list. Talk with your dietitian about what dietary choices are best for you. Grains Whole-grain or whole-wheat bread. Whole-grain or whole-wheat pasta. Brown rice. [place]. Bulgur. Whole-grain and low-sodium cereals. Pita bread. Low-fat, low-sodium crackers. Whole-wheat flour  tortillas. Vegetables Fresh or frozen vegetables (raw, steamed, roasted, or grilled). Low-sodium or reduced-sodium tomato and vegetable juice. Low-sodium or reduced-sodium tomato sauce and tomato paste. Low-sodium or reduced-sodium canned vegetables. Fruits All fresh, dried, or frozen fruit. Canned fruit in natural juice (without added sugar). Meat and other protein foods Skinless chicken or [place]. Ground chicken or [place]. Pork with fat trimmed off. Fish and seafood. Egg whites. Dried beans, peas, or lentils. Unsalted nuts, nut butters, and seeds. Unsalted canned beans. Lean cuts of beef with fat trimmed off. Low-sodium, lean deli meat. Dairy Low-fat (1%) or fat-free (skim) milk. Fat-free, low-fat, or reduced-fat cheeses. Nonfat, low-sodium ricotta or cottage cheese. Low-fat or nonfat yogurt. Low-fat, low-sodium cheese. Fats and oils Soft margarine without trans fats. Vegetable oil. Low-fat, reduced-fat, or light mayonnaise and salad dressings (reduced-sodium). Canola, safflower, olive, soybean, and sunflower oils. Avocado. Seasoning and other foods Herbs. Spices. Seasoning mixes without salt. Unsalted popcorn and pretzels. Fat-free sweets. What foods are not recommended? The items listed may not be a complete list. Talk with your dietitian about what dietary choices are best for you. Grains Baked goods made with fat, such as croissants, muffins, or some breads. Dry pasta or rice  meal packs. Vegetables Creamed or fried vegetables. Vegetables in a cheese sauce. Regular canned vegetables (not low-sodium or reduced-sodium). Regular canned tomato sauce and paste (not low-sodium or reduced-sodium). Regular tomato and vegetable juice (not low-sodium or reduced-sodium). Angie Fava. Olives. Fruits Canned fruit in a light or heavy syrup. Fried fruit. Fruit in cream or butter sauce. Meat and other protein foods Fatty cuts of meat. Ribs. Fried meat. [place] Salines. Sausage. Bologna and other processed lunch meats.  Salami. Fatback. Hotdogs. Bratwurst. Salted nuts and seeds. Canned beans with added salt. Canned or smoked fish. Whole eggs or egg yolks. Chicken or [place] with skin. Dairy Whole or 2% milk, cream, and half-and-half. Whole or full-fat cream cheese. Whole-fat or sweetened yogurt. Full-fat cheese. Nondairy creamers. Whipped toppings. Processed cheese and cheese spreads. Fats and oils Butter. Stick margarine. Lard. Shortening. Ghee. Bacon fat. Tropical oils, such as coconut, palm kernel, or palm oil. Seasoning and other foods Salted popcorn and pretzels. Onion salt, garlic salt, seasoned salt, table salt, and sea salt. Worcestershire sauce. Tartar sauce. Barbecue sauce. Teriyaki sauce. Soy sauce, including reduced-sodium. Steak sauce. Canned and packaged gravies. Fish sauce. Oyster sauce. Cocktail sauce. Horseradish that you find on the shelf. Ketchup. Mustard. Meat flavorings and tenderizers. Bouillon cubes. Hot sauce and Tabasco sauce. Premade or packaged marinades. Premade or packaged taco seasonings. Relishes. Regular salad dressings. Where to find more information:  National Heart, Lung, and [place]: https://wilson-eaton.com/  American Heart Association: www.heart.org Summary  The DASH eating plan is a healthy eating plan that has been shown to reduce high blood pressure (hypertension). It may also reduce your risk for type 2 diabetes, heart disease, and stroke.  With the DASH eating plan, you should limit salt (sodium) intake to 2,300 mg a day. If you have hypertension, you may need to reduce your sodium intake to 1,500 mg a day.  When on the DASH eating plan, aim to eat more fresh fruits and vegetables, whole grains, lean proteins, low-fat dairy, and heart-healthy fats.  Work with your health care provider or diet and nutrition specialist (dietitian) to adjust your eating plan to your individual calorie needs. This information is not intended to replace advice given to you by your health  care provider. Make sure you discuss any questions you have with your health care provider. Document Revised: 12/14/2016 Document Reviewed: 12/26/2015 Elsevier Patient Education  [place].  Hypertension, Adult High blood pressure (hypertension) is when the force of blood pumping through the arteries is too strong. The arteries are the blood vessels that carry blood from the heart throughout the body. Hypertension forces the heart to work harder to pump blood and may cause arteries to become narrow or stiff. Untreated or uncontrolled hypertension can cause a heart attack, heart failure, a stroke, kidney disease, and other problems. A blood pressure reading consists of a higher number over a lower number. Ideally, your blood pressure should be below 120/80. The first ("top") number is called the systolic pressure. It is a measure of the pressure in your arteries as your heart beats. The second ("bottom") number is called the diastolic pressure. It is a measure of the pressure in your arteries as the heart relaxes. What are the causes? The exact cause of this condition is not known. There are some conditions that result in or are related to high blood pressure. What increases the risk? Some risk factors for high blood pressure are under your control. The following factors may make you more likely to develop this condition:  Smoking.  Having type 2 diabetes mellitus, high cholesterol, or both.  Not getting enough exercise or physical activity.  Being overweight.  Having too much fat, sugar, calories, or salt (sodium) in your diet.  Drinking too much alcohol. Some risk factors for high blood pressure may be difficult or impossible to change. Some of these factors include:  Having chronic kidney disease.  Having a family history of high blood pressure.  Age. Risk increases with age.  Race. You may be at higher risk if you are African American.  Gender. Men are at higher risk than  women before age 29. After age 42, women are at higher risk than men.  Having obstructive sleep apnea.  Stress. What are the signs or symptoms? High blood pressure may not cause symptoms. Very high blood pressure (hypertensive crisis) may cause:  Headache.  Anxiety.  Shortness of breath.  Nosebleed.  Nausea and vomiting.  Vision changes.  Severe chest pain.  Seizures. How is this diagnosed? This condition is diagnosed by measuring your blood pressure while you are seated, with your arm resting on a flat surface, your legs uncrossed, and your feet flat on the floor. The cuff of the blood pressure monitor will be placed directly against the skin of your upper arm at the level of your heart. It should be measured at least twice using the same arm. Certain conditions can cause a difference in blood pressure between your right and left arms. Certain factors can cause blood pressure readings to be lower or higher than normal for a short period of time:  When your blood pressure is higher when you are in a health care provider's office than when you are at home, this is called white coat hypertension. Most people with this condition do not need medicines.  When your blood pressure is higher at home than when you are in a health care provider's office, this is called masked hypertension. Most people with this condition may need medicines to control blood pressure. If you have a high blood pressure reading during one visit or you have normal blood pressure with other risk factors, you may be asked to:  Return on a different day to have your blood pressure checked again.  Monitor your blood pressure at home for 1 week or longer. If you are diagnosed with hypertension, you may have other blood or imaging tests to help your health care provider understand your overall risk for other conditions. How is this treated? This condition is treated by making healthy lifestyle changes, such as eating  healthy foods, exercising more, and reducing your alcohol intake. Your health care provider may prescribe medicine if lifestyle changes are not enough to get your blood pressure under control, and if:  Your systolic blood pressure is above 130.  Your diastolic blood pressure is above 80. Your personal target blood pressure may vary depending on your medical conditions, your age, and other factors. Follow these instructions at home: Eating and drinking   Eat a diet that is high in fiber and potassium, and low in sodium, added sugar, and fat. An example eating plan is called the DASH (Dietary Approaches to Stop Hypertension) diet. To eat this way: ? Eat plenty of fresh fruits and vegetables. Try to fill one half of your plate at each meal with fruits and vegetables. ? Eat whole grains, such as whole-wheat pasta, brown rice, or whole-grain bread. Fill about one fourth of your plate with whole grains. ? Eat or drink low-fat  dairy products, such as skim milk or low-fat yogurt. ? Avoid fatty cuts of meat, processed or cured meats, and poultry with skin. Fill about one fourth of your plate with lean proteins, such as fish, chicken without skin, beans, eggs, or tofu. ? Avoid pre-made and processed foods. These tend to be higher in sodium, added sugar, and fat.  Reduce your daily sodium intake. Most people with hypertension should eat less than 1,500 mg of sodium a day.  Do not drink alcohol if: ? Your health care provider tells you not to drink. ? You are pregnant, may be pregnant, or are planning to become pregnant.  If you drink alcohol: ? Limit how much you use to:  0-1 drink a day for women.  0-2 drinks a day for men. ? Be aware of how much alcohol is in your drink. In the [place] one drink equals one 12 oz bottle of beer (355 mL), one 5 oz glass of wine (148 mL), or one 1 oz glass of hard liquor (44 mL). Lifestyle   Work with your health care provider to maintain a healthy body weight or  to lose weight. Ask what an ideal weight is for you.  Get at least 30 minutes of exercise most days of the week. Activities may include walking, swimming, or biking.  Include exercise to strengthen your muscles (resistance exercise), such as Pilates or lifting weights, as part of your weekly exercise routine. Try to do these types of exercises for 30 minutes at least 3 days a week.  Do not use any products that contain nicotine or tobacco, such as cigarettes, e-cigarettes, and chewing tobacco. If you need help quitting, ask your health care provider.  Monitor your blood pressure at home as told by your health care provider.  Keep all follow-up visits as told by your health care provider. This is important. Medicines  Take over-the-counter and prescription medicines only as told by your health care provider. Follow directions carefully. Blood pressure medicines must be taken as prescribed.  Do not skip doses of blood pressure medicine. Doing this puts you at risk for problems and can make the medicine less effective.  Ask your health care provider about side effects or reactions to medicines that you should watch for. Contact a health care provider if you:  Think you are having a reaction to a medicine you are taking.  Have headaches that keep coming back (recurring).  Feel dizzy.  Have swelling in your ankles.  Have trouble with your vision. Get help right away if you:  Develop a severe headache or confusion.  Have unusual weakness or numbness.  Feel faint.  Have severe pain in your chest or abdomen.  Vomit repeatedly.  Have trouble breathing. Summary  Hypertension is when the force of blood pumping through your arteries is too strong. If this condition is not controlled, it may put you at risk for serious complications.  Your personal target blood pressure may vary depending on your medical conditions, your age, and other factors. For most people, a normal blood  pressure is less than 120/80.  Hypertension is treated with lifestyle changes, medicines, or a combination of both. Lifestyle changes include losing weight, eating a healthy, low-sodium diet, exercising more, and limiting alcohol. This information is not intended to replace advice given to you by your health care provider. Make sure you discuss any questions you have with your health care provider. Document Revised: 09/11/2017 Document Reviewed: 09/11/2017 Elsevier Patient Education  2020 Elsevier  [place] or Strain Rehab Ask your health care provider which exercises are safe for you. Do exercises exactly as told by your health care provider and adjust them as directed. It is normal to feel mild stretching, pulling, tightness, or discomfort as you do these exercises. Stop right away if you feel sudden pain or your pain gets worse. Do not begin these exercises until told by your health care provider. Stretching and range-of-motion exercises These exercises warm up your muscles and joints and improve the movement and flexibility of your back. These exercises also help to relieve pain, numbness, and tingling. Lumbar rotation  1. Lie on your back on a firm surface and bend your knees. 2. Straighten your arms out to your sides so each arm forms a 90-degree angle (right angle) with a side of your body. 3. Slowly move (rotate) both of your knees to one side of your body until you feel a stretch in your lower back (lumbar). Try not to let your shoulders lift off the floor. 4. Hold this position for __________ seconds. 5. Tense your abdominal muscles and slowly move your knees back to the starting position. 6. Repeat this exercise on the other side of your body. Repeat __________ times. Complete this exercise __________ times a day. Single knee to chest  1. Lie on your back on a firm surface with both legs straight. 2. Bend one of your knees. Use your hands to move your knee up toward  your chest until you feel a gentle stretch in your lower back and buttock. ? Hold your leg in this position by holding on to the front of your knee. ? Keep your other leg as straight as possible. 3. Hold this position for __________ seconds. 4. Slowly return to the starting position. 5. Repeat with your other leg. Repeat __________ times. Complete this exercise __________ times a day. Prone extension on elbows  1. Lie on your abdomen on a firm surface (prone position). 2. Prop yourself up on your elbows. 3. Use your arms to help lift your chest up until you feel a gentle stretch in your abdomen and your lower back. ? This will place some of your body weight on your elbows. If this is uncomfortable, try stacking pillows under your chest. ? Your hips should stay down, against the surface that you are lying on. Keep your hip and back muscles relaxed. 4. Hold this position for __________ seconds. 5. Slowly relax your upper body and return to the starting position. Repeat __________ times. Complete this exercise __________ times a day. Strengthening exercises These exercises build strength and endurance in your back. Endurance is the ability to use your muscles for a long time, even after they get tired. Pelvic tilt This exercise strengthens the muscles that lie deep in the abdomen. 1. Lie on your back on a firm surface. Bend your knees and keep your feet flat on the floor. 2. Tense your abdominal muscles. Tip your pelvis up toward the ceiling and flatten your lower back into the floor. ? To help with this exercise, you may place a small towel under your lower back and try to push your back into the towel. 3. Hold this position for __________ seconds. 4. Let your muscles relax completely before you repeat this exercise. Repeat __________ times. Complete this exercise __________ times a day. Alternating arm and leg raises  1. Get on your hands and knees on a firm surface. If you are on  a hard  floor, you may want to use padding, such as an exercise mat, to cushion your knees. 2. Line up your arms and legs. Your hands should be directly below your shoulders, and your knees should be directly below your hips. 3. Lift your left leg behind you. At the same time, raise your right arm and straighten it in front of you. ? Do not lift your leg higher than your hip. ? Do not lift your arm higher than your shoulder. ? Keep your abdominal and back muscles tight. ? Keep your hips facing the ground. ? Do not arch your back. ? Keep your balance carefully, and do not hold your breath. 4. Hold this position for __________ seconds. 5. Slowly return to the starting position. 6. Repeat with your right leg and your left arm. Repeat __________ times. Complete this exercise __________ times a day. Abdominal set with straight leg raise  1. Lie on your back on a firm surface. 2. Bend one of your knees and keep your other leg straight. 3. Tense your abdominal muscles and lift your straight leg up, 4-6 inches (10-15 cm) off the ground. 4. Keep your abdominal muscles tight and hold this position for __________ seconds. ? Do not hold your breath. ? Do not arch your back. Keep it flat against the ground. 5. Keep your abdominal muscles tense as you slowly lower your leg back to the starting position. 6. Repeat with your other leg. Repeat __________ times. Complete this exercise __________ times a day. Single leg lower with bent knees 1. Lie on your back on a firm surface. 2. Tense your abdominal muscles and lift your feet off the floor, one foot at a time, so your knees and hips are bent in 90-degree angles (right angles). ? Your knees should be over your hips and your lower legs should be parallel to the floor. 3. Keeping your abdominal muscles tense and your knee bent, slowly lower one of your legs so your toe touches the ground. 4. Lift your leg back up to return to the starting position. ? Do not hold  your breath. ? Do not let your back arch. Keep your back flat against the ground. 5. Repeat with your other leg. Repeat __________ times. Complete this exercise __________ times a day. Posture and body mechanics Good posture and healthy body mechanics can help to relieve stress in your body's tissues and joints. Body mechanics refers to the movements and positions of your body while you do your daily activities. Posture is part of body mechanics. Good posture means:  Your spine is in its natural S-curve position (neutral).  Your shoulders are pulled back slightly.  Your head is not tipped forward. Follow these guidelines to improve your posture and body mechanics in your everyday activities. Standing   When standing, keep your spine neutral and your feet about hip width apart. Keep a slight bend in your knees. Your ears, shoulders, and hips should line up.  When you do a task in which you stand in one place for a long time, place one foot up on a stable object that is 2-4 inches (5-10 cm) high, such as a footstool. This helps keep your spine neutral. Sitting   When sitting, keep your spine neutral and keep your feet flat on the floor. Use a footrest, if necessary, and keep your thighs parallel to the floor. Avoid rounding your shoulders, and avoid tilting your head forward.  When working at a desk or a  computer, keep your desk at a height where your hands are slightly lower than your elbows. Slide your chair under your desk so you are close enough to maintain good posture.  When working at a computer, place your monitor at a height where you are looking straight ahead and you do not have to tilt your head forward or downward to look at the screen. Resting  When lying down and resting, avoid positions that are most painful for you.  If you have pain with activities such as sitting, bending, stooping, or squatting, lie in a position in which your body does not bend very much. For  example, avoid curling up on your side with your arms and knees near your chest (fetal position).  If you have pain with activities such as standing for a long time or reaching with your arms, lie with your spine in a neutral position and bend your knees slightly. Try the following positions: ? Lying on your side with a pillow between your knees. ? Lying on your back with a pillow under your knees. Lifting   When lifting objects, keep your feet at least shoulder width apart and tighten your abdominal muscles.  Bend your knees and hips and keep your spine neutral. It is important to lift using the strength of your legs, not your back. Do not lock your knees straight out.  Always ask for help to lift heavy or awkward objects. This information is not intended to replace advice given to you by your health care provider. Make sure you discuss any questions you have with your health care provider. Document Revised: 04/25/2018 Document Reviewed: 01/23/2018 Elsevier Patient Education  [place].  Back Exercises The following exercises strengthen the muscles that help to support the trunk and back. They also help to keep the lower back flexible. Doing these exercises can help to prevent back pain or lessen existing pain.  If you have back pain or discomfort, try doing these exercises 2-3 times each day or as told by your health care provider.  As your pain improves, do them once each day, but increase the number of times that you repeat the steps for each exercise (do more repetitions).  To prevent the recurrence of back pain, continue to do these exercises once each day or as told by your health care provider. Do exercises exactly as told by your health care provider and adjust them as directed. It is normal to feel mild stretching, pulling, tightness, or discomfort as you do these exercises, but you should stop right away if you feel sudden pain or your pain gets worse. Exercises Single  knee to chest Repeat these steps 3-5 times for each leg: 1. Lie on your back on a firm bed or the floor with your legs extended. 2. Bring one knee to your chest. Your other leg should stay extended and in contact with the floor. 3. Hold your knee in place by grabbing your knee or thigh with both hands and hold. 4. Pull on your knee until you feel a gentle stretch in your lower back or buttocks. 5. Hold the stretch for 10-30 seconds. 6. Slowly release and straighten your leg. Pelvic tilt Repeat these steps 5-10 times: 1. Lie on your back on a firm bed or the floor with your legs extended. 2. Bend your knees so they are pointing toward the ceiling and your feet are flat on the floor. 3. Tighten your lower abdominal muscles to press your lower back  against the floor. This motion will tilt your pelvis so your tailbone points up toward the ceiling instead of pointing to your feet or the floor. 4. With gentle tension and even breathing, hold this position for 5-10 seconds. Cat-cow Repeat these steps until your lower back becomes more flexible: 1. Get into a hands-and-knees position on a firm surface. Keep your hands under your shoulders, and keep your knees under your hips. You may place padding under your knees for comfort. 2. Let your head hang down toward your chest. Contract your abdominal muscles and point your tailbone toward the floor so your lower back becomes rounded like the back of a cat. 3. Hold this position for 5 seconds. 4. Slowly lift your head, let your abdominal muscles relax and point your tailbone up toward the ceiling so your back forms a sagging arch like the back of a cow. 5. Hold this position for 5 seconds.  Press-ups Repeat these steps 5-10 times: 1. Lie on your abdomen (face-down) on the floor. 2. Place your palms near your head, about shoulder-width apart. 3. Keeping your back as relaxed as possible and keeping your hips on the floor, slowly straighten your arms to  raise the top half of your body and lift your shoulders. Do not use your back muscles to raise your upper torso. You may adjust the placement of your hands to make yourself more comfortable. 4. Hold this position for 5 seconds while you keep your back relaxed. 5. Slowly return to lying flat on the floor.  Bridges Repeat these steps 10 times: 1. Lie on your back on a firm surface. 2. Bend your knees so they are pointing toward the ceiling and your feet are flat on the floor. Your arms should be flat at your sides, next to your body. 3. Tighten your buttocks muscles and lift your buttocks off the floor until your waist is at almost the same height as your knees. You should feel the muscles working in your buttocks and the back of your thighs. If you do not feel these muscles, slide your feet 1-2 inches farther away from your buttocks. 4. Hold this position for 3-5 seconds. 5. Slowly lower your hips to the starting position, and allow your buttocks muscles to relax completely. If this exercise is too easy, try doing it with your arms crossed over your chest. Abdominal crunches Repeat these steps 5-10 times: 1. Lie on your back on a firm bed or the floor with your legs extended. 2. Bend your knees so they are pointing toward the ceiling and your feet are flat on the floor. 3. Cross your arms over your chest. 4. Tip your chin slightly toward your chest without bending your neck. 5. Tighten your abdominal muscles and slowly raise your trunk (torso) high enough to lift your shoulder blades a tiny bit off the floor. Avoid raising your torso higher than that because it can put too much stress on your low back and does not help to strengthen your abdominal muscles. 6. Slowly return to your starting position. Back lifts Repeat these steps 5-10 times: 1. Lie on your abdomen (face-down) with your arms at your sides, and rest your forehead on the floor. 2. Tighten the muscles in your legs and your  buttocks. 3. Slowly lift your chest off the floor while you keep your hips pressed to the floor. Keep the back of your head in line with the curve in your back. Your eyes should be looking at the  floor. 4. Hold this position for 3-5 seconds. 5. Slowly return to your starting position. Contact a health care provider if:  Your back pain or discomfort gets much worse when you do an exercise.  Your worsening back pain or discomfort does not lessen within 2 hours after you exercise. If you have any of these problems, stop doing these exercises right away. Do not do them again unless your health care provider says that you can. Get help right away if:  You develop sudden, severe back pain. If this happens, stop doing the exercises right away. Do not do them again unless your health care provider says that you can. This information is not intended to replace advice given to you by your health care provider. Make sure you discuss any questions you have with your health care provider. Document Revised: 05/08/2018 Document Reviewed: 10/03/2017 Elsevier Patient Education  [place].

## 2019-02-11 NOTE — Progress Notes (Signed)
Virtual Visit via Video Note  I connected with Meghan Young  on 02/11/19 at  1:10 PM EST by a video enabled telemedicine application and verified that I am speaking with the correct person using two identifiers.  Location patient: work Environmental manager or home office Persons participating in the virtual visit: patient, provider  I discussed the limitations of evaluation and management by telemedicine and the availability of in person appointments. The patient expressed understanding and agreed to proceed.   HPI: 1. 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 HTN not taking lasix 40 mg qd prn, BP 02/02/19 cataract surgery was elevated and 02/09/19 146/77 pulse 91, then 164/101 HR 103, 163/99 HR 92, BP 156/94 HR 113, 165/100 HR 97, 163/109 HR 104 today BP lower She only drinks 1 cup of coffee qd and not tea    ROS: See pertinent positives and negatives per HPI.  Past Medical History:  Diagnosis Date  . Allergy   . Anxiety   . Blood in stool   . Cataracts, bilateral    surgery right eye 01/2017   . Chicken pox   . Depression   . GERD (gastroesophageal reflux disease)   . Papilloma of nose    with h/o nose spur   . UTI (urinary tract infection)     Past Surgical History:  Procedure Laterality Date  . ABDOMINAL HYSTERECTOMY     partial   . APPENDECTOMY    . CATARACT EXTRACTION Right 01/23/2017   right eye only   . cataract surgery     01/2017 [place]  . cataract surgery     02/02/19 [place] left   . CHOLECYSTECTOMY    . TONSILLECTOMY    . TONSILLECTOMY    . TOTAL VAGINAL HYSTERECTOMY  2002   due to uterine prolapse  . TUBAL LIGATION      Family History  Problem Relation Age of Onset  . Raynaud syndrome Mother   . Arthritis Mother   . Depression Mother   . Anxiety disorder Mother   . Early death  Father        age 27? reason  . Depression Daughter   . Hypertension Son   . Arthritis Maternal Grandmother   . Hypertension Maternal Grandmother   . Miscarriages / Stillbirths Maternal Grandmother   . Depression Maternal Grandfather   . Drug abuse Maternal Grandfather   . Early death Maternal Grandfather   . Heart disease Maternal Grandfather        tachycardia   . Arthritis Paternal Grandmother   . Hypertension Paternal Grandmother   . Early death Paternal Grandfather   . Prostate cancer Maternal Uncle 70  . Prostate cancer Cousin   . 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  albuterol (PROVENTIL HFA;VENTOLIN HFA) 108 (90 Base) MCG/ACT inhaler, Inhale 2 puffs into the lungs every 6 (six) hours as needed for wheezing or shortness of breath., Disp: 1 Inhaler, Rfl: 12 .  albuterol (PROVENTIL) (2.5 MG/3ML) 0.083% nebulizer solution, Take 3 mLs (2.5 mg total) by nebulization every 6 (six) hours as  needed for wheezing or shortness of breath., Disp: 75 mL, Rfl: 12 .  ALPRAZolam (XANAX) 1 MG tablet, taking 0.5 mg bid and 1 mg qhs prn, Disp: 60 tablet, Rfl: 5 .  b complex vitamins tablet, Take 1 tablet by mouth daily., Disp: , Rfl:  .  fluticasone (FLONASE) 50 MCG/ACT nasal spray, 1 or 2 sprays each nostril twice a day, Disp: 16 g, Rfl: 11 .  furosemide (LASIX) 40 MG tablet, Take 1 tablet (40 mg total) by mouth daily as needed., Disp: 90 tablet, Rfl: 3 .  omeprazole (PRILOSEC) 40 MG capsule, Take 1 capsule (40 mg total) by mouth daily., Disp: 90 capsule, Rfl: 3 .  venlafaxine XR (EFFEXOR-XR) 150 MG 24 hr capsule, Take 1 capsule (150 mg total) by mouth daily with breakfast., Disp: 90 capsule, Rfl: 3 .  olmesartan (BENICAR) 20 MG tablet, Take 1 tablet (20 mg total) by mouth daily. 1st 3 days 1/2 pill 10 mg.  Day 4 increase to 1 pill daily goal <130/<80, Disp: 90 tablet, Rfl: 3  EXAM:  VITALS per patient if applicable:  GENERAL: alert, oriented, appears well and in no acute distress  HEENT: atraumatic, conjunttiva clear, no obvious abnormalities on inspection of external nose and ears  NECK: normal movements of the head and neck  LUNGS: on inspection no signs of respiratory distress, breathing rate appears normal, no obvious gross SOB, gasping or wheezing  CV: no obvious cyanosis  MS: moves all visible extremities without noticeable abnormality  PSYCH/NEURO: pleasant and cooperative, no obvious depression or anxiety, speech and thought processing grossly intact  ASSESSMENT AND PLAN:  Discussed the following assessment and plan:  Essential hypertension - Plan: olmesartan (BENICAR) 20 MG tablet 1/2 dose x 3 days then increase to 1 pill daily  Anxiety - Plan: venlafaxine XR (EFFEXOR-XR) 150 MG 24 hr capsule increase from 75 mg qd  Therapy EAP Q 2 weeks  Avoid stressors I.e face paced clinic at work letter given today   HM flu shothad 10/23/17 work health [place] get again this year Tdap had8/21/2018 Disc shingrixdiscprevgiven info Hep B,MMR, varicellaimmune HCV and HIV neg 04/09/16  rec exercise to lose   Pap not due per pt f/u [facility] 08/2018   Colonoscopy disc today pt wants to wait never had as of 09/17/2018   Mammogram rsch 10/06/18 negative   rec healthy diet and exercise will fill out work related form   Nodular densities CT chest noted in 2012 consider repeat CT chest in future   09/29/2018  -Aorta which is blood artery in abdomen borderline enlarged we need to repeat Ultrasound in 5 years   -we discussed possible serious and likely etiologies, options for evaluation and workup, limitations of telemedicine visit vs in person visit, treatment, treatment risks and precautions. Pt prefers to treat via telemedicine empirically rather then risking or  undertaking an in person visit at this moment. Patient agrees to seek prompt in person care if worsening, new symptoms arise, or if is not improving with treatment.   I discussed the assessment and treatment plan with the patient. The patient was provided an opportunity to ask questions and all were answered. The patient agreed with the plan and demonstrated an understanding of the instructions.   The patient was advised to call back or seek an in-person evaluation if the symptoms worsen or if the condition fails to improve as anticipated.  Time spent 20-29 minutes  Delorise Jackson, MD

## 2019-02-20 ENCOUNTER — Encounter (INDEPENDENT_AMBULATORY_CARE_PROVIDER_SITE_OTHER): Payer: Managed Care, Other (non HMO) | Admitting: Internal Medicine

## 2019-02-20 DIAGNOSIS — M545 Low back pain, unspecified: Secondary | ICD-10-CM

## 2019-02-23 ENCOUNTER — Encounter: Payer: Self-pay | Admitting: Internal Medicine

## 2019-02-23 ENCOUNTER — Other Ambulatory Visit: Payer: Self-pay | Admitting: Internal Medicine

## 2019-02-23 DIAGNOSIS — M545 Low back pain, unspecified: Secondary | ICD-10-CM

## 2019-02-23 NOTE — Telephone Encounter (Signed)
Yes I am fine with my chart apt. Yes an xray would be fine. I have a recheck eye apt in the morning 02-24-19 at [facility] so anytime after that or another day would be fine. I have been using naproxen and ice (when at home) to help with the pain.       Non-Urgent Medical Question  Meghan, Young  You 6 hours ago (2:42 PM)   Yes I am fine with my chart apt. Yes an xray would be fine. I have a recheck eye apt in the morning 02-24-19 at [facility] so anytime after that or another day would be fine. I have been using naproxen and ice (when at home) to help with the pain.   You  [name], [name] 10 hours ago (11:38 AM)  TM Any new issues needs an appt or you can do a my chart consult for a fee Are you agreeable to my chart consult to address for a fee?   If so Ive ordered an Xray of your low back at [facility] [address] and we can discuss further after you get this Xray   Blood pressure is looking better continue to trend goal <130/<80  [name], [name], RMA routed conversation to You 3 days ago  [name], [name]  You 3 days ago   I'm experiencing lower back pain. I've had before. I tried taking aleve it didnt touch it. Please advise. I'm home today it hurts to sit, twist, bend down.  Using BP medicine BP reading better just not consistent.  PA at HD told me to try to first thing in morning. Took this morning 136/83.  I'm trying to eat better and drink plenty water.   A/P 1. Xray low back  2. Back stretches  3. Tylenol or salonpas otc  4. Consider PT based on Xray  Agreeable to my chart fee  [name] Time spent 5-10 minutes

## 2019-02-24 ENCOUNTER — Other Ambulatory Visit: Payer: Self-pay | Admitting: Internal Medicine

## 2019-02-24 DIAGNOSIS — M545 Low back pain, unspecified: Secondary | ICD-10-CM

## 2019-03-13 ENCOUNTER — Other Ambulatory Visit: Payer: Self-pay | Admitting: Internal Medicine

## 2019-03-13 DIAGNOSIS — F419 Anxiety disorder, unspecified: Secondary | ICD-10-CM

## 2019-03-13 MED ORDER — ALPRAZOLAM 1 MG PO TABS: ORAL_TABLET | ORAL | 5 refills | Status: AC

## 2019-03-20 ENCOUNTER — Other Ambulatory Visit: Payer: Self-pay

## 2019-03-20 ENCOUNTER — Ambulatory Visit: Payer: Managed Care, Other (non HMO) | Admitting: Internal Medicine

## 2019-03-24 ENCOUNTER — Ambulatory Visit: Payer: Managed Care, Other (non HMO) | Admitting: Internal Medicine

## 2019-05-07 ENCOUNTER — Ambulatory Visit: Payer: Managed Care, Other (non HMO) | Admitting: Internal Medicine

## 2019-05-07 ENCOUNTER — Encounter: Payer: Self-pay | Admitting: Internal Medicine

## 2019-05-07 ENCOUNTER — Other Ambulatory Visit: Payer: Self-pay

## 2019-05-07 VITALS — BP 134/86 | HR 101 | Temp 97.7°F | Ht 63.0 in | Wt 231.6 lb

## 2019-05-07 DIAGNOSIS — F419 Anxiety disorder, unspecified: Secondary | ICD-10-CM

## 2019-05-07 DIAGNOSIS — K760 Fatty (change of) liver, not elsewhere classified: Secondary | ICD-10-CM

## 2019-05-07 DIAGNOSIS — F32A Depression, unspecified: Secondary | ICD-10-CM

## 2019-05-07 DIAGNOSIS — R911 Solitary pulmonary nodule: Secondary | ICD-10-CM

## 2019-05-07 DIAGNOSIS — Z1231 Encounter for screening mammogram for malignant neoplasm of breast: Secondary | ICD-10-CM | POA: Diagnosis not present

## 2019-05-07 DIAGNOSIS — I10 Essential (primary) hypertension: Secondary | ICD-10-CM | POA: Diagnosis not present

## 2019-05-07 DIAGNOSIS — F329 Major depressive disorder, single episode, unspecified: Secondary | ICD-10-CM

## 2019-05-07 DIAGNOSIS — E785 Hyperlipidemia, unspecified: Secondary | ICD-10-CM

## 2019-05-07 MED ORDER — OLMESARTAN MEDOXOMIL-HCTZ 20-12.5 MG PO TABS: 1.0000 | ORAL_TABLET | Freq: Every day | ORAL | 3 refills | Status: AC

## 2019-05-07 NOTE — Progress Notes (Signed)
Chief Complaint  Patient presents with  . Blood Pressure Check  . Anxiety  . Depression   F/u  1. HTN elevated on benicar 20 mg qd, lasix 40 mg qd prn  2. Anxiety and depression gad 7 19 and phq9 score 6 she was seeing a therapist Meghan Young but would find cost $60/session and telephone but feels like virtual would help  Anxiety worse in am and worse when she is tired and does not want to go to activities  3. CT lung nodules in 2012 never really a smoker and never had f/u CT chest    Review of Systems  Constitutional: Negative for weight loss.  HENT: Negative for hearing loss.   Eyes: Negative for blurred vision.  Respiratory: Negative for shortness of breath.   Cardiovascular: Negative for chest pain.  Gastrointestinal: Negative for abdominal pain.  Musculoskeletal: Negative for falls.  Skin: Negative for rash.  Neurological: Negative for headaches.  Psychiatric/Behavioral: Negative for depression.   Past Medical History:  Diagnosis Date  . Allergy   . Anxiety   . Blood in stool   . Cataracts, bilateral    surgery right eye 01/2017   . Chicken pox   . Depression   . GERD (gastroesophageal reflux disease)   . Papilloma of nose    with h/o nose spur   . UTI (urinary tract infection)    Past Surgical History:  Procedure Laterality Date  . ABDOMINAL HYSTERECTOMY     partial   . APPENDECTOMY    . CATARACT EXTRACTION Right 01/23/2017   right eye only   . cataract surgery     01/2017 [place]  . cataract surgery     02/02/19 [place] left   . CHOLECYSTECTOMY    . TONSILLECTOMY    . TONSILLECTOMY    . TOTAL VAGINAL HYSTERECTOMY  2002   due to uterine prolapse  . TUBAL LIGATION     Family History  Problem Relation Age of Onset  . Raynaud syndrome Mother   . Arthritis Mother   . Depression Mother   . Anxiety disorder Mother   . Early death Father        age 79? reason  . Depression Daughter   . Hypertension Son   . Arthritis Maternal Grandmother   . Hypertension  Maternal Grandmother   . Miscarriages / Stillbirths Maternal Grandmother   . Depression Maternal Grandfather   . Drug abuse Maternal Grandfather   . Early death Maternal Grandfather   . Heart disease Maternal Grandfather        tachycardia   . Arthritis Paternal Grandmother   . Hypertension Paternal Grandmother   . Early death Paternal Grandfather   . Prostate cancer Maternal Uncle 43  . Prostate cancer Cousin   . Breast cancer Neg Hx    Social History   Socioeconomic History  . Marital status: Married    Spouse name: Not on file  . Number of children: Not on file  . Years of education: Not on file  . Highest education level: Not on file  Occupational History  . Not on file  Tobacco Use  . Smoking status: Former Smoker    Quit date: 04/30/2013    Years since quitting: 6.0  . Smokeless tobacco: Never Used  Substance and Sexual Activity  . Alcohol use: Yes    Alcohol/week: 7.0 standard drinks    Types: 7 Standard drinks or equivalent per week    Comment: daily  . Drug use: No  .  Sexual activity: Yes    Birth control/protection: Surgical    Comment: Hysterectomy  Other Topics Concern  . 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 Difficulty of Paying Living Expenses:   Food Insecurity:   . Worried About Charity fundraiser in the Last Year:   . Arboriculturist in the Last Year:   Transportation Needs:   . Film/video editor (Medical):   Marland Kitchen Lack of Transportation (Non-Medical):   Physical Activity:   . Days of Exercise per Week:   . Minutes of Exercise per Session:   Stress:   . Feeling of Stress :   Social Connections:   . Frequency of Communication with Friends and Family:   . Frequency of Social Gatherings with Friends and Family:    . Attends Religious Services:   . Active Member of Clubs or Organizations:   . Attends Archivist Meetings:   Marland Kitchen Marital Status:   Intimate Partner Violence:   . Fear of Current or Ex-Partner:   . Emotionally Abused:   Marland Kitchen Physically Abused:   . Sexually Abused:    Current Meds  Medication Sig  . ALPRAZolam (XANAX) 1 MG tablet taking 0.5 mg bid and 1 mg qhs prn  . fluticasone (FLONASE) 50 MCG/ACT nasal spray 1 or 2 sprays each nostril twice a day  . furosemide (LASIX) 40 MG tablet Take 1 tablet (40 mg total) by mouth daily as needed.  Marland Kitchen omeprazole (PRILOSEC) 40 MG capsule Take 1 capsule (40 mg total) by mouth daily.  Marland Kitchen venlafaxine XR (EFFEXOR-XR) 150 MG 24 hr capsule Take 1 capsule (150 mg total) by mouth daily with breakfast.  . [DISCONTINUED] olmesartan (BENICAR) 20 MG tablet Take 1 tablet (20 mg total) by mouth daily. 1st 3 days 1/2 pill 10 mg. Day 4 increase to 1 pill daily goal <130/<80   Allergies  Allergen Reactions  . Klonopin [Clonazepam]     Did not tolerate    No results found for this or any previous visit (from the past 2160 hour(s)). Objective  Body mass index is 41.03 kg/m. Wt Readings from Last 3 Encounters:  05/07/19 231 lb 9.6 oz (105.1 kg)  02/11/19 225 lb (102.1 kg)  11/11/18 234 lb (106.1 kg)   Temp Readings from Last 3 Encounters:  05/07/19 97.7 F (36.5 C) (Oral)  02/05/18 98.4 F (36.9 C) (Oral)  11/15/17 97.9 F (36.6 C) (Oral)   BP Readings from Last 3 Encounters:  05/07/19 134/86  02/11/19 (!) 156/94  09/11/18 120/80   Pulse Readings from Last 3 Encounters:  05/07/19 (!) 101  02/05/18 (!) 104  11/15/17 90    Physical Exam Vitals and nursing note reviewed.  Constitutional:      Appearance: Normal appearance. She is well-developed and well-groomed. She is morbidly obese.  HENT:     Head: Normocephalic and atraumatic.  Eyes:     Conjunctiva/sclera: Conjunctivae normal.     Pupils: Pupils are equal, round, and reactive to  light.  Cardiovascular:     Rate and Rhythm: Normal rate and regular rhythm.     Heart sounds: Normal heart sounds. No murmur.  Pulmonary:  Effort: Pulmonary effort is normal.     Breath sounds: Normal breath sounds.  Abdominal:     General: Abdomen is flat. Bowel sounds are normal.     Tenderness: There is no abdominal tenderness.  Skin:    General: Skin is warm and dry.  Neurological:     General: No focal deficit present.     Mental Status: She is alert and oriented to person, place, and time. Mental status is at baseline.     Gait: Gait normal.  Psychiatric:        Attention and Perception: Attention and perception normal.        Mood and Affect: Mood and affect normal.        Speech: Speech normal.        Behavior: Behavior normal. Behavior is cooperative.        Thought Content: Thought content normal.        Cognition and Memory: Cognition and memory normal.        Judgment: Judgment normal.     Assessment  Plan  Essential hypertension - Plan: olmesartan-hydrochlorothiazide (BENICAR HCT) 20-12.5 MG tablet D/c benicar 20 mg qd  Lasix 40 mg only qd prn   Lung nodule - Plan: CT Chest Wo Contrast  Anxiety and depression Cont xanax 0.5 bid and 1 mg qhs effexor 150 mg xr  Likely needs to follow with therapy in person  Fatty liver  Control risk factors  Hep A/B immune  HM flu shothad 10/23/17 work health [place] get again this year Tdap had8/21/2018 Disc shingrixdiscprevgiven info Hep B,MMR, varicellaimmune HCV and HIV neg 04/09/16 2/2 [facility]   rec exercise to lose  Pap not due per pt f/u [facility] 08/2018  06/05/17 neg neg Pap   Colonoscopy disc today pt wants to wait never had as of 09/17/2018 vs cologuard  Disc cologuard and call and get price with insurance   Mammogram 10/06/18 negative referred again  rec healthy diet and exercise will fill out work related form   Nodular densities CT chest noted in 2012 consider repeat CT  chest -ordered CT chest   09/29/2018  -Aorta which is blood artery in abdomen borderline enlarged we need to repeat Ultrasound in 5 years  Provider: Dr. Olivia Mackie McLean-Scocuzza-Internal Medicine

## 2019-05-07 NOTE — Patient Instructions (Addendum)
Meditation apps  Replika, Bloom, Insight Timer*, Calm, Headspace  cologuard call insurance and get cost CPT code [id number]   Will need to do on Monday if you do this cologuard   estroven consider for menopause symptoms or amberen    Mindfulness-Based Stress Reduction Mindfulness-based stress reduction (MBSR) is a program that helps people learn to practice mindfulness. Mindfulness is the practice of intentionally paying attention to the present moment. It can be learned and practiced through techniques such as education, breathing exercises, meditation, and yoga. MBSR includes several mindfulness techniques in one program. MBSR works best when you understand the treatment, are willing to try new things, and can commit to spending time practicing what you learn. MBSR training may include learning about:  How your emotions, thoughts, and reactions affect your body.  New ways to respond to things that cause negative thoughts to start (triggers).  How to notice your thoughts and let go of them.  Practicing awareness of everyday things that you normally do without thinking.  The techniques and goals of different types of meditation. What are the benefits of MBSR? MBSR can have many benefits, which include helping you to:  Develop self-awareness. This refers to knowing and understanding yourself.  Learn skills and attitudes that help you to participate in your own health care.  Learn new ways to care for yourself.  Be more accepting about how things are, and let things go.  Be less judgmental and approach things with an open mind.  Be patient with yourself and trust yourself more. MBSR has also been shown to:  Reduce negative emotions, such as depression and anxiety.  Improve memory and focus.  Change how you sense and approach pain.  Boost your body's ability to fight infections.  Help you connect better with other people.  Improve your sense of well-being. Follow these  instructions at home:   Find a local in-person or online MBSR program.  Set aside some time regularly for mindfulness practice.  Find a mindfulness practice that works best for you. This may include one or more of the following: ? Meditation. Meditation involves focusing your mind on a certain thought or activity. ? Breathing awareness exercises. These help you to stay present by focusing on your breath. ? Body scan. For this practice, you lie down and pay attention to each part of your body from head to toe. You can identify tension and soreness and intentionally relax parts of your body. ? Yoga. Yoga involves stretching and breathing, and it can improve your ability to move and be flexible. It can also provide an experience of testing your body's limits, which can help you release stress. ? Mindful eating. This way of eating involves focusing on the taste, texture, color, and smell of each bite of food. Because this slows down eating and helps you feel full sooner, it can be an important part of a weight-loss plan.  Find a podcast or recording that provides guidance for breathing awareness, body scan, or meditation exercises. You can listen to these any time when you have a free moment to rest without distractions.  Follow your treatment plan as told by your health care provider. This may include taking regular medicines and making changes to your diet or lifestyle as recommended. How to practice mindfulness To do a basic awareness exercise:  Find a comfortable place to sit.  Pay attention to the present moment. Observe your thoughts, feelings, and surroundings just as they are.  Avoid placing judgment on  yourself, your feelings, or your surroundings. Make note of any judgment that comes up, and let it go.  Your mind may wander, and that is okay. Make note of when your thoughts drift, and return your attention to the present moment. To do basic mindfulness meditation:  Find a  comfortable place to sit. This may include a stable chair or a firm floor cushion. ? Sit upright with your back straight. Let your arms fall next to your side with your hands resting on your legs. ? If sitting in a chair, rest your feet flat on the floor. ? If sitting on a cushion, cross your legs in front of you.  Keep your head in a neutral position with your chin dropped slightly. Relax your jaw and rest the tip of your tongue on the roof of your mouth. Drop your gaze to the floor. You can close your eyes if you like.  Breathe normally and pay attention to your breath. Feel the air moving in and out of your nose. Feel your belly expanding and relaxing with each breath.  Your mind may wander, and that is okay. Make note of when your thoughts drift, and return your attention to your breath.  Avoid placing judgment on yourself, your feelings, or your surroundings. Make note of any judgment or feelings that come up, let them go, and bring your attention back to your breath.  When you are ready, lift your gaze or open your eyes. Pay attention to how your body feels after the meditation. Where to find more information You can find more information about MBSR from:  Your health care provider.  Community-based meditation centers or programs.  Programs offered near you. Summary  Mindfulness-based stress reduction (MBSR) is a program that teaches you how to intentionally pay attention to the present moment. It is used with other treatments to help you cope better with daily stress, emotions, and pain.  MBSR focuses on developing self-awareness, which allows you to respond to life stress without judgment or negative emotions.  MBSR programs may involve learning different mindfulness practices, such as breathing exercises, meditation, yoga, body scan, or mindful eating. Find a mindfulness practice that works best for you, and set aside time for it on a regular basis. This information is not  intended to replace advice given to you by your health care provider. Make sure you discuss any questions you have with your health care provider. Document Revised: 12/14/2016 Document Reviewed: 05/10/2016 Elsevier Patient Education  [place].

## 2019-05-08 ENCOUNTER — Telehealth: Payer: Self-pay | Admitting: Internal Medicine

## 2019-05-08 NOTE — Telephone Encounter (Signed)
I left vm for pt to call ofc to sch CT. I did not leave msg for the reason no name on vm.

## 2019-05-11 ENCOUNTER — Telehealth: Payer: Self-pay | Admitting: Internal Medicine

## 2019-05-11 NOTE — Telephone Encounter (Signed)
I left msg for pt to call ofc to sch CT. I did not leave the reason for call no name on vm,

## 2019-05-12 ENCOUNTER — Telehealth: Payer: Self-pay | Admitting: Internal Medicine

## 2019-05-12 NOTE — Telephone Encounter (Signed)
I left msg for pt to call ofc to sch CT.

## 2019-05-22 ENCOUNTER — Other Ambulatory Visit: Payer: Self-pay

## 2019-05-22 ENCOUNTER — Other Ambulatory Visit (INDEPENDENT_AMBULATORY_CARE_PROVIDER_SITE_OTHER): Payer: Managed Care, Other (non HMO)

## 2019-05-22 DIAGNOSIS — I10 Essential (primary) hypertension: Secondary | ICD-10-CM

## 2019-05-22 DIAGNOSIS — E785 Hyperlipidemia, unspecified: Secondary | ICD-10-CM | POA: Diagnosis not present

## 2019-05-22 LAB — COMPREHENSIVE METABOLIC PANEL
ALT: 49 U/L — ABNORMAL HIGH (ref 0–35)
AST: 38 U/L — ABNORMAL HIGH (ref 0–37)
Albumin: 4.5 g/dL (ref 3.5–5.2)
Alkaline Phosphatase: 123 U/L — ABNORMAL HIGH (ref 39–117)
BUN: 14 mg/dL (ref 6–23)
CO2: 32 mEq/L (ref 19–32)
Calcium: 9.2 mg/dL (ref 8.4–10.5)
Chloride: 98 mEq/L (ref 96–112)
Creatinine, Ser: 0.72 mg/dL (ref 0.40–1.20)
GFR: 83.32 mL/min (ref >60.00)
Glucose, Bld: 100 mg/dL — ABNORMAL HIGH (ref 70–99)
Potassium: 4.4 mEq/L (ref 3.5–5.1)
Sodium: 137 mEq/L (ref 135–145)
Total Bilirubin: 0.8 mg/dL (ref 0.2–1.2)
Total Protein: 7.2 g/dL (ref 6.0–8.3)

## 2019-05-22 LAB — CBC WITH DIFFERENTIAL/PLATELET
Basophils Absolute: 0 10*3/uL (ref 0.0–0.1)
Basophils Relative: 0.5 % (ref 0.0–3.0)
Eosinophils Absolute: 0 10*3/uL (ref 0.0–0.7)
Eosinophils Relative: 0.2 % (ref 0.0–5.0)
HCT: 38.5 % (ref 36.0–46.0)
Hemoglobin: 13.5 g/dL (ref 12.0–15.0)
Lymphocytes Relative: 28.2 % (ref 12.0–46.0)
Lymphs Abs: 1.5 10*3/uL (ref 0.7–4.0)
MCHC: 35.1 g/dL (ref 30.0–36.0)
MCV: 92 fl (ref 78.0–100.0)
Monocytes Absolute: 0.4 10*3/uL (ref 0.1–1.0)
Monocytes Relative: 7.7 % (ref 3.0–12.0)
Neutro Abs: 3.5 10*3/uL (ref 1.4–7.7)
Neutrophils Relative %: 63.4 % (ref 43.0–77.0)
Platelets: 247 10*3/uL (ref 150.0–400.0)
RBC: 4.19 Mil/uL (ref 3.87–5.11)
RDW: 13.6 % (ref 11.5–15.5)
WBC: 5.5 10*3/uL (ref 4.0–10.5)

## 2019-05-22 LAB — LIPID PANEL
Cholesterol: 187 mg/dL (ref 0–200)
HDL: 76.4 mg/dL (ref >39.00)
LDL Cholesterol: 93 mg/dL (ref 0–99)
NonHDL: 110.89
Total CHOL/HDL Ratio: 2
Triglycerides: 91 mg/dL (ref 0.0–149.0)
VLDL: 18.2 mg/dL (ref 0.0–40.0)

## 2019-06-02 ENCOUNTER — Ambulatory Visit: Admission: RE | Admit: 2019-06-02 | Payer: Managed Care, Other (non HMO) | Source: Ambulatory Visit

## 2019-06-07 ENCOUNTER — Encounter: Admission: EM | Disposition: E | Payer: Self-pay | Source: Home / Self Care | Attending: Internal Medicine

## 2019-06-07 ENCOUNTER — Inpatient Hospital Stay
Admission: EM | Admit: 2019-06-07 | Discharge: 2019-06-16 | DRG: 286 | Disposition: E | Payer: Managed Care, Other (non HMO) | Attending: Internal Medicine | Admitting: Internal Medicine

## 2019-06-07 ENCOUNTER — Emergency Department: Payer: Managed Care, Other (non HMO)

## 2019-06-07 ENCOUNTER — Inpatient Hospital Stay: Payer: Managed Care, Other (non HMO)

## 2019-06-07 DIAGNOSIS — R570 Cardiogenic shock: Secondary | ICD-10-CM | POA: Diagnosis present

## 2019-06-07 DIAGNOSIS — Z515 Encounter for palliative care: Secondary | ICD-10-CM | POA: Diagnosis not present

## 2019-06-07 DIAGNOSIS — I420 Dilated cardiomyopathy: Secondary | ICD-10-CM | POA: Diagnosis present

## 2019-06-07 DIAGNOSIS — Z452 Encounter for adjustment and management of vascular access device: Secondary | ICD-10-CM

## 2019-06-07 DIAGNOSIS — Z87891 Personal history of nicotine dependence: Secondary | ICD-10-CM

## 2019-06-07 DIAGNOSIS — I4901 Ventricular fibrillation: Secondary | ICD-10-CM | POA: Diagnosis present

## 2019-06-07 DIAGNOSIS — X30XXXA Exposure to excessive natural heat, initial encounter: Secondary | ICD-10-CM | POA: Diagnosis not present

## 2019-06-07 DIAGNOSIS — E876 Hypokalemia: Secondary | ICD-10-CM | POA: Diagnosis present

## 2019-06-07 DIAGNOSIS — J9601 Acute respiratory failure with hypoxia: Secondary | ICD-10-CM | POA: Diagnosis present

## 2019-06-07 DIAGNOSIS — G931 Anoxic brain damage, not elsewhere classified: Secondary | ICD-10-CM | POA: Diagnosis present

## 2019-06-07 DIAGNOSIS — G92 Toxic encephalopathy: Secondary | ICD-10-CM | POA: Diagnosis present

## 2019-06-07 DIAGNOSIS — J9600 Acute respiratory failure, unspecified whether with hypoxia or hypercapnia: Secondary | ICD-10-CM

## 2019-06-07 DIAGNOSIS — T675XXA Heat exhaustion, unspecified, initial encounter: Secondary | ICD-10-CM | POA: Diagnosis present

## 2019-06-07 DIAGNOSIS — Z66 Do not resuscitate: Secondary | ICD-10-CM | POA: Diagnosis not present

## 2019-06-07 DIAGNOSIS — K219 Gastro-esophageal reflux disease without esophagitis: Secondary | ICD-10-CM | POA: Diagnosis present

## 2019-06-07 DIAGNOSIS — I5021 Acute systolic (congestive) heart failure: Secondary | ICD-10-CM | POA: Diagnosis present

## 2019-06-07 DIAGNOSIS — Z20822 Contact with and (suspected) exposure to covid-19: Secondary | ICD-10-CM | POA: Diagnosis present

## 2019-06-07 DIAGNOSIS — J449 Chronic obstructive pulmonary disease, unspecified: Secondary | ICD-10-CM | POA: Diagnosis present

## 2019-06-07 DIAGNOSIS — K760 Fatty (change of) liver, not elsewhere classified: Secondary | ICD-10-CM | POA: Diagnosis present

## 2019-06-07 DIAGNOSIS — F1010 Alcohol abuse, uncomplicated: Secondary | ICD-10-CM | POA: Diagnosis present

## 2019-06-07 DIAGNOSIS — J9602 Acute respiratory failure with hypercapnia: Secondary | ICD-10-CM | POA: Diagnosis present

## 2019-06-07 DIAGNOSIS — Z90710 Acquired absence of both cervix and uterus: Secondary | ICD-10-CM | POA: Diagnosis not present

## 2019-06-07 DIAGNOSIS — E872 Acidosis: Secondary | ICD-10-CM | POA: Diagnosis present

## 2019-06-07 DIAGNOSIS — R739 Hyperglycemia, unspecified: Secondary | ICD-10-CM | POA: Diagnosis present

## 2019-06-07 DIAGNOSIS — I468 Cardiac arrest due to other underlying condition: Secondary | ICD-10-CM | POA: Diagnosis present

## 2019-06-07 DIAGNOSIS — N179 Acute kidney failure, unspecified: Secondary | ICD-10-CM | POA: Diagnosis present

## 2019-06-07 DIAGNOSIS — R680 Hypothermia, not associated with low environmental temperature: Secondary | ICD-10-CM | POA: Diagnosis present

## 2019-06-07 DIAGNOSIS — Z9049 Acquired absence of other specified parts of digestive tract: Secondary | ICD-10-CM

## 2019-06-07 DIAGNOSIS — F329 Major depressive disorder, single episode, unspecified: Secondary | ICD-10-CM | POA: Diagnosis present

## 2019-06-07 DIAGNOSIS — I110 Hypertensive heart disease with heart failure: Secondary | ICD-10-CM | POA: Diagnosis present

## 2019-06-07 DIAGNOSIS — I469 Cardiac arrest, cause unspecified: Secondary | ICD-10-CM | POA: Diagnosis present

## 2019-06-07 DIAGNOSIS — Z6841 Body Mass Index (BMI) 40.0 and over, adult: Secondary | ICD-10-CM

## 2019-06-07 HISTORY — PX: LEFT HEART CATH AND CORONARY ANGIOGRAPHY: CATH118249

## 2019-06-07 HISTORY — PX: CORONARY/GRAFT ACUTE MI REVASCULARIZATION: CATH118305

## 2019-06-07 LAB — COMPREHENSIVE METABOLIC PANEL
ALT: 241 U/L — ABNORMAL HIGH (ref 0–44)
AST: 292 U/L — ABNORMAL HIGH (ref 15–41)
Albumin: 4.5 g/dL (ref 3.5–5.0)
Alkaline Phosphatase: 134 U/L — ABNORMAL HIGH (ref 38–126)
Anion gap: 19 — ABNORMAL HIGH (ref 5–15)
BUN: 19 mg/dL (ref 6–20)
CO2: 19 mmol/L — ABNORMAL LOW (ref 22–32)
Calcium: 8.7 mg/dL — ABNORMAL LOW (ref 8.9–10.3)
Chloride: 96 mmol/L — ABNORMAL LOW (ref 98–111)
Creatinine, Ser: 1.55 mg/dL — ABNORMAL HIGH (ref 0.44–1.00)
GFR calc Af Amer: 43 mL/min — ABNORMAL LOW (ref >60)
GFR calc non Af Amer: 37 mL/min — ABNORMAL LOW (ref >60)
Glucose, Bld: 171 mg/dL — ABNORMAL HIGH (ref 70–99)
Potassium: 2.8 mmol/L — ABNORMAL LOW (ref 3.5–5.1)
Sodium: 134 mmol/L — ABNORMAL LOW (ref 135–145)
Total Bilirubin: 0.9 mg/dL (ref 0.3–1.2)
Total Protein: 8.2 g/dL — ABNORMAL HIGH (ref 6.5–8.1)

## 2019-06-07 LAB — BLOOD GAS, ARTERIAL
Acid-base deficit: 10.8 mmol/L — ABNORMAL HIGH (ref 0.0–2.0)
Bicarbonate: 17.2 mmol/L — ABNORMAL LOW (ref 20.0–28.0)
FIO2: 1
MECHVT: 450 mL
O2 Saturation: 87 %
PEEP: 5 cmH2O
Patient temperature: 37
RATE: 20 resp/min
pCO2 arterial: 45 mmHg (ref 32.0–48.0)
pH, Arterial: 7.19 — CL (ref 7.350–7.450)
pO2, Arterial: 66 mmHg — ABNORMAL LOW (ref 83.0–108.0)

## 2019-06-07 LAB — TRIGLYCERIDES: Triglycerides: 147 mg/dL (ref <150)

## 2019-06-07 LAB — PROTIME-INR
INR: 1 (ref 0.8–1.2)
Prothrombin Time: 13.1 seconds (ref 11.4–15.2)

## 2019-06-07 LAB — CBC WITH DIFFERENTIAL/PLATELET
Abs Immature Granulocytes: 1.13 10*3/uL — ABNORMAL HIGH (ref 0.00–0.07)
Basophils Absolute: 0.1 10*3/uL (ref 0.0–0.1)
Basophils Relative: 1 %
Eosinophils Absolute: 0 10*3/uL (ref 0.0–0.5)
Eosinophils Relative: 0 %
HCT: 39.9 % (ref 36.0–46.0)
Hemoglobin: 13.9 g/dL (ref 12.0–15.0)
Immature Granulocytes: 4 %
Lymphocytes Relative: 29 %
Lymphs Abs: 7.4 10*3/uL — ABNORMAL HIGH (ref 0.7–4.0)
MCH: 31.7 pg (ref 26.0–34.0)
MCHC: 34.8 g/dL (ref 30.0–36.0)
MCV: 90.9 fL (ref 80.0–100.0)
Monocytes Absolute: 1.3 10*3/uL — ABNORMAL HIGH (ref 0.1–1.0)
Monocytes Relative: 5 %
Neutro Abs: 16 10*3/uL — ABNORMAL HIGH (ref 1.7–7.7)
Neutrophils Relative %: 61 %
Platelets: 377 10*3/uL (ref 150–400)
RBC: 4.39 MIL/uL (ref 3.87–5.11)
RDW: 12.6 % (ref 11.5–15.5)
WBC: 26.1 10*3/uL — ABNORMAL HIGH (ref 4.0–10.5)
nRBC: 0.1 % (ref 0.0–0.2)

## 2019-06-07 LAB — BRAIN NATRIURETIC PEPTIDE: B Natriuretic Peptide: 130.8 pg/mL — ABNORMAL HIGH (ref 0.0–100.0)

## 2019-06-07 LAB — TROPONIN I (HIGH SENSITIVITY): Troponin I (High Sensitivity): 225 ng/L (ref <18)

## 2019-06-07 SURGERY — CORONARY/GRAFT ACUTE MI REVASCULARIZATION
Anesthesia: Moderate Sedation

## 2019-06-07 MED ORDER — ASPIRIN 300 MG RE SUPP: 300.0000 mg | RECTAL | Status: DC

## 2019-06-07 MED ORDER — PROPOFOL 1000 MG/100ML IV EMUL
0.0000 ug/kg/min | INTRAVENOUS | Status: DC
  Administered 2019-06-07: 5 ug/kg/min via INTRAVENOUS

## 2019-06-07 MED ORDER — ROCURONIUM BROMIDE 50 MG/5ML IV SOLN
100.0000 mg | Freq: Once | INTRAVENOUS | Status: AC
  Administered 2019-06-07: 100 mg via INTRAVENOUS
  Filled 2019-06-07: qty 10

## 2019-06-07 MED ORDER — HEPARIN SODIUM (PORCINE) 1000 UNIT/ML IJ SOLN
INTRAMUSCULAR | Status: AC
  Filled 2019-06-07: qty 1

## 2019-06-07 MED ORDER — SODIUM CHLORIDE 0.9 % IV SOLN
1.0000 ug/kg/min | INTRAVENOUS | Status: DC
  Administered 2019-06-08: 1.5 ug/kg/min via INTRAVENOUS
  Administered 2019-06-08: 1 ug/kg/min via INTRAVENOUS
  Administered 2019-06-09: 1.5 ug/kg/min via INTRAVENOUS
  Filled 2019-06-07 (×3): qty 20

## 2019-06-07 MED ORDER — SODIUM CHLORIDE 0.9 % IV SOLN: INTRAVENOUS | Status: DC

## 2019-06-07 MED ORDER — LIDOCAINE HCL (PF) 1 % IJ SOLN
INTRAMUSCULAR | Status: DC | PRN
  Administered 2019-06-07: 2 mL

## 2019-06-07 MED ORDER — AMIODARONE HCL IN DEXTROSE 360-4.14 MG/200ML-% IV SOLN
60.0000 mg/h | INTRAVENOUS | Status: AC
  Administered 2019-06-07: 60 mg/h via INTRAVENOUS
  Filled 2019-06-07: qty 200

## 2019-06-07 MED ORDER — IOHEXOL 350 MG/ML SOLN
60.0000 mL | Freq: Once | INTRAVENOUS | Status: AC | PRN
  Administered 2019-06-07: 75 mL via INTRAVENOUS

## 2019-06-07 MED ORDER — NOREPINEPHRINE 4 MG/250ML-% IV SOLN
0.0000 ug/min | INTRAVENOUS | Status: DC
  Administered 2019-06-08: 22 ug/min via INTRAVENOUS
  Administered 2019-06-08: 3 ug/min via INTRAVENOUS
  Filled 2019-06-07 (×2): qty 250

## 2019-06-07 MED ORDER — MAGNESIUM SULFATE 50 % IJ SOLN
INTRAMUSCULAR | Status: AC
  Filled 2019-06-07: qty 2

## 2019-06-07 MED ORDER — POTASSIUM CHLORIDE 10 MEQ/100ML IV SOLN
10.0000 meq | INTRAVENOUS | Status: DC
  Administered 2019-06-08: 10 meq via INTRAVENOUS

## 2019-06-07 MED ORDER — ASPIRIN 300 MG RE SUPP
300.0000 mg | Freq: Once | RECTAL | Status: AC
  Administered 2019-06-07: 300 mg via RECTAL

## 2019-06-07 MED ORDER — VERAPAMIL HCL 2.5 MG/ML IV SOLN
INTRAVENOUS | Status: AC
  Filled 2019-06-07: qty 2

## 2019-06-07 MED ORDER — FENTANYL CITRATE (PF) 100 MCG/2ML IJ SOLN
100.0000 ug | Freq: Once | INTRAMUSCULAR | Status: AC
  Administered 2019-06-08: 100 ug via INTRAVENOUS
  Filled 2019-06-07: qty 2

## 2019-06-07 MED ORDER — ARTIFICIAL TEARS OPHTHALMIC OINT
1.0000 "application " | TOPICAL_OINTMENT | Freq: Three times a day (TID) | OPHTHALMIC | Status: DC
  Administered 2019-06-08 – 2019-06-11 (×11): 1 via OPHTHALMIC
  Filled 2019-06-07: qty 1

## 2019-06-07 MED ORDER — STERILE WATER FOR INJECTION IV SOLN
INTRAVENOUS | Status: DC
  Filled 2019-06-07 (×3): qty 850

## 2019-06-07 MED ORDER — PROPOFOL 1000 MG/100ML IV EMUL
INTRAVENOUS | Status: AC
  Filled 2019-06-07: qty 100

## 2019-06-07 MED ORDER — HEPARIN (PORCINE) IN NACL 1000-0.9 UT/500ML-% IV SOLN
INTRAVENOUS | Status: AC
  Filled 2019-06-07: qty 1000

## 2019-06-07 MED ORDER — CISATRACURIUM BOLUS VIA INFUSION
0.0500 mg/kg | INTRAVENOUS | Status: DC | PRN
  Filled 2019-06-07: qty 6

## 2019-06-07 MED ORDER — INSULIN ASPART 100 UNIT/ML ~~LOC~~ SOLN
0.0000 [IU] | SUBCUTANEOUS | Status: DC
  Administered 2019-06-08 – 2019-06-09 (×6): 3 [IU] via SUBCUTANEOUS
  Administered 2019-06-09 (×2): 2 [IU] via SUBCUTANEOUS
  Administered 2019-06-09: 3 [IU] via SUBCUTANEOUS
  Administered 2019-06-09 – 2019-06-10 (×2): 2 [IU] via SUBCUTANEOUS
  Filled 2019-06-07 (×11): qty 1

## 2019-06-07 MED ORDER — FAMOTIDINE IN NACL 20-0.9 MG/50ML-% IV SOLN
20.0000 mg | Freq: Two times a day (BID) | INTRAVENOUS | Status: DC
  Administered 2019-06-08 – 2019-06-10 (×7): 20 mg via INTRAVENOUS
  Filled 2019-06-07 (×7): qty 50

## 2019-06-07 MED ORDER — AMIODARONE HCL IN DEXTROSE 360-4.14 MG/200ML-% IV SOLN
30.0000 mg/h | INTRAVENOUS | Status: DC
  Administered 2019-06-08 – 2019-06-11 (×7): 30 mg/h via INTRAVENOUS
  Filled 2019-06-07 (×6): qty 200

## 2019-06-07 MED ORDER — POTASSIUM CHLORIDE 10 MEQ/100ML IV SOLN
10.0000 meq | Freq: Once | INTRAVENOUS | Status: AC
  Administered 2019-06-07 – 2019-06-08 (×2): 10 meq via INTRAVENOUS
  Filled 2019-06-07 (×2): qty 100

## 2019-06-07 MED ORDER — MAGNESIUM SULFATE 50 % IJ SOLN: 1.0000 g | Freq: Once | INTRAMUSCULAR | Status: DC

## 2019-06-07 MED ORDER — FENTANYL 2500MCG IN NS 250ML (10MCG/ML) PREMIX INFUSION
100.0000 ug/h | INTRAVENOUS | Status: DC
  Administered 2019-06-08: 25 ug/h via INTRAVENOUS
  Administered 2019-06-09: 75 ug/h via INTRAVENOUS
  Filled 2019-06-07 (×2): qty 250

## 2019-06-07 MED ORDER — PROPOFOL 1000 MG/100ML IV EMUL
25.0000 ug/kg/min | INTRAVENOUS | Status: DC
  Administered 2019-06-08 (×2): 50 ug/kg/min via INTRAVENOUS
  Administered 2019-06-08: 40 ug/kg/min via INTRAVENOUS
  Administered 2019-06-08 (×2): 25 ug/kg/min via INTRAVENOUS
  Administered 2019-06-08: 50 ug/kg/min via INTRAVENOUS
  Administered 2019-06-08: 35 ug/kg/min via INTRAVENOUS
  Administered 2019-06-09: 80 ug/kg/min via INTRAVENOUS
  Administered 2019-06-09: 50 ug/kg/min via INTRAVENOUS
  Administered 2019-06-09: 80 ug/kg/min via INTRAVENOUS
  Administered 2019-06-09 (×3): 50 ug/kg/min via INTRAVENOUS
  Administered 2019-06-09 (×3): 80 ug/kg/min via INTRAVENOUS
  Administered 2019-06-09: 50 ug/kg/min via INTRAVENOUS
  Administered 2019-06-09 – 2019-06-10 (×5): 80 ug/kg/min via INTRAVENOUS
  Filled 2019-06-07 (×4): qty 100
  Filled 2019-06-07: qty 200
  Filled 2019-06-07 (×5): qty 100
  Filled 2019-06-07: qty 200
  Filled 2019-06-07 (×9): qty 100

## 2019-06-07 MED ORDER — ETOMIDATE 2 MG/ML IV SOLN
30.0000 mg | Freq: Once | INTRAVENOUS | Status: AC
  Administered 2019-06-07: 30 mg via INTRAVENOUS

## 2019-06-07 MED ORDER — CISATRACURIUM BOLUS VIA INFUSION
0.1000 mg/kg | Freq: Once | INTRAVENOUS | Status: AC
  Administered 2019-06-08: 12 mg via INTRAVENOUS
  Filled 2019-06-07: qty 12

## 2019-06-07 MED ORDER — FENTANYL BOLUS VIA INFUSION
50.0000 ug | INTRAVENOUS | Status: DC | PRN
  Filled 2019-06-07: qty 50

## 2019-06-07 MED ORDER — ENOXAPARIN SODIUM 40 MG/0.4ML ~~LOC~~ SOLN
40.0000 mg | SUBCUTANEOUS | Status: DC
  Administered 2019-06-08: 40 mg via SUBCUTANEOUS
  Filled 2019-06-07: qty 0.4

## 2019-06-07 MED ORDER — HEPARIN (PORCINE) IN NACL 1000-0.9 UT/500ML-% IV SOLN
INTRAVENOUS | Status: DC | PRN
  Administered 2019-06-07 (×2): 500 mL

## 2019-06-07 MED ORDER — IOHEXOL 300 MG/ML  SOLN
INTRAMUSCULAR | Status: DC | PRN
  Administered 2019-06-07: 105 mL

## 2019-06-07 MED ORDER — VERAPAMIL HCL 2.5 MG/ML IV SOLN
INTRAVENOUS | Status: DC | PRN
  Administered 2019-06-07: 2.5 mg via INTRACORONARY

## 2019-06-07 MED ORDER — HEPARIN SODIUM (PORCINE) 1000 UNIT/ML IJ SOLN
INTRAMUSCULAR | Status: DC | PRN
  Administered 2019-06-07: 4000 [IU] via INTRAVENOUS

## 2019-06-07 MED ORDER — DEXTROSE 5 % IV SOLN
300.0000 mg | Freq: Once | INTRAVENOUS | Status: AC
  Administered 2019-06-07: 300 mg via INTRAVENOUS
  Filled 2019-06-07: qty 6

## 2019-06-07 MED ORDER — ALBUTEROL SULFATE (2.5 MG/3ML) 0.083% IN NEBU: 2.5000 mg | INHALATION_SOLUTION | Freq: Four times a day (QID) | RESPIRATORY_TRACT | Status: DC | PRN

## 2019-06-07 SURGICAL SUPPLY — 11 items
CATH 5F 110X4 TIG (CATHETERS) ×3 IMPLANT
CATH INFINITI 5FR ANG PIGTAIL (CATHETERS) ×3 IMPLANT
DEVICE INFLAT 30 PLUS (MISCELLANEOUS) IMPLANT
DEVICE RAD COMP TR BAND LRG (VASCULAR PRODUCTS) ×3 IMPLANT
GLIDESHEATH SLEND SS 6F .021 (SHEATH) ×3 IMPLANT
GUIDEWIRE INQWIRE 1.5J.035X260 (WIRE) ×1 IMPLANT
INQWIRE 1.5J .035X260CM (WIRE) ×3
KIT MANI 3VAL PERCEP (MISCELLANEOUS) ×3 IMPLANT
PACK CARDIAC CATH (CUSTOM PROCEDURE TRAY) ×3 IMPLANT
PROTECTION STATION PRESSURIZED (MISCELLANEOUS) ×3
STATION PROTECTION PRESSURIZED (MISCELLANEOUS) ×1 IMPLANT

## 2019-06-07 NOTE — Consult Note (Signed)
[facility]  CARDIOLOGY CONSULT NOTE  Patient ID: Meghan Young MRN: [id number] DOB/AGE: 58/16/1962 58 y.o.  Admit date: 06/10/2019 Referring Physician [facility] Primary Physician  Primary Cardiologist  Reason for Consultation cardiac arrest  HPI: 59 year old female referred for urgent cardiac catheterization following cardiac arrest.  According to her husband, the patient was sitting in a chair on her porch and her neighbor noted that she had collapsed.  Husband started CPR, reports return of pulses transiently.  EMS arrived and approximately 10 to 15 minutes, she received cardioversion x3.  Patient presented to [facility] ED where she was intubated and placed on mechanical ventilator.  ECG revealed sinus tachycardia with nonspecific intraventricular conduction delay.  Review of systems complete and found to be negative unless listed above     Past Medical History:  Diagnosis Date  . Allergy   . Anxiety   . Blood in stool   . Cataracts, bilateral    surgery right eye 01/2017   . Chicken pox   . Depression   . GERD (gastroesophageal reflux disease)   . Papilloma of nose    with h/o nose spur   . UTI (urinary tract infection)     Past Surgical History:  Procedure Laterality Date  . ABDOMINAL HYSTERECTOMY     partial   . APPENDECTOMY    . CATARACT EXTRACTION Right 01/23/2017   right eye only   . cataract surgery     01/2017 [place]  . cataract surgery     02/02/19 [place] left   . CHOLECYSTECTOMY    . TONSILLECTOMY    . TONSILLECTOMY    . TOTAL VAGINAL HYSTERECTOMY  2002   due to uterine prolapse  . TUBAL LIGATION      Medications Prior to Admission  Medication Sig Dispense Refill Last Dose  . ALPRAZolam (XANAX) 1 MG tablet taking 0.5 mg bid and 1 mg qhs prn 60 tablet 5 Unknown at Unknown  . fluticasone (FLONASE) 50 MCG/ACT nasal spray 1 or 2 sprays each nostril twice a day 16 g 11 Unknown at Unknown  . furosemide (LASIX) 40 MG tablet Take 1 tablet (40 mg total) by mouth daily as  needed. 90 tablet 3 prn at prn  . olmesartan-hydrochlorothiazide (BENICAR HCT) 20-12.5 MG tablet Take 1 tablet by mouth daily. In am 90 tablet 3 Unknown at Unknown  . omeprazole (PRILOSEC) 40 MG capsule Take 1 capsule (40 mg total) by mouth daily. 90 capsule 3 Unknown at Unknown  . venlafaxine XR (EFFEXOR-XR) 150 MG 24 hr capsule Take 1 capsule (150 mg total) by mouth daily with breakfast. 90 capsule 3 Unknown at Unknown  . albuterol (PROVENTIL HFA;VENTOLIN HFA) 108 (90 Base) MCG/ACT inhaler Inhale 2 puffs into the lungs every 6 (six) hours as needed for wheezing or shortness of breath. (Patient not taking: Reported on 05/07/2019) 1 Inhaler 12 Not Taking at Unknown time  . albuterol (PROVENTIL) (2.5 MG/3ML) 0.083% nebulizer solution Take 3 mLs (2.5 mg total) by nebulization every 6 (six) hours as needed for wheezing or shortness of breath. (Patient not taking: Reported on 05/07/2019) 75 mL 12 Not Taking at Unknown time  . b complex vitamins tablet Take 1 tablet by mouth daily.   Not Taking at Unknown time   Social History   Socioeconomic History  . Marital status: Married    Spouse name: Not on file  . Number of children: Not on file  . Years of education: Not on file  . Highest education level: Not on file  Occupational History  . Not on file  Tobacco Use  . Smoking status: Former Smoker    Quit date: 04/30/2013    Years since quitting: 6.1  . Smokeless tobacco: Never Used  Substance and Sexual Activity  . Alcohol use: Yes    Alcohol/week: 7.0 standard drinks    Types: 7 Standard drinks or equivalent per week    Comment: daily  . Drug use: No  . Sexual activity: Yes    Birth control/protection: Surgical    Comment: Hysterectomy  Other Topics Concern  . 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 Difficulty of Paying Living Expenses:   Food Insecurity:   . Worried About Charity fundraiser in the Last Year:   . Arboriculturist in the Last Year:   Transportation Needs:   . Film/video editor (Medical):   Marland Kitchen Lack of Transportation (Non-Medical):   Physical Activity:   . Days of Exercise per Week:   . Minutes of Exercise per Session:   Stress:   . Feeling of Stress :   Social Connections:   . Frequency of Communication with Friends and Family:   . Frequency of Social Gatherings with Friends and Family:   . Attends Religious Services:   . Active Member of Clubs or Organizations:   . Attends Archivist Meetings:   Marland Kitchen Marital Status:   Intimate Partner Violence:   . Fear of Current or Ex-Partner:   . Emotionally Abused:   Marland Kitchen Physically Abused:   . Sexually Abused:     Family History  Problem Relation Age of Onset  . Raynaud syndrome Mother   . Arthritis Mother   . Depression Mother   . Anxiety disorder Mother   . Early death Father        age 41? reason  . Depression Daughter   . Hypertension Son   . Arthritis Maternal Grandmother   . Hypertension Maternal Grandmother   . Miscarriages / Stillbirths Maternal Grandmother   . Depression Maternal Grandfather   . Drug abuse Maternal Grandfather   . Early death Maternal Grandfather   . Heart disease Maternal Grandfather        tachycardia   . Arthritis Paternal Grandmother   . Hypertension Paternal Grandmother   . Early death Paternal Grandfather   . Prostate cancer Maternal Uncle 8  . Prostate cancer Cousin   . Breast cancer Neg Hx       Review of systems complete and found to be negative unless listed above      PHYSICAL EXAM  General: Well developed, well nourished, in no acute distress HEENT:  Normocephalic and atramatic Neck:  No JVD.  Lungs: Clear bilaterally to auscultation and percussion. Heart: HRRR . Normal S1 and S2 without gallops or murmurs.   Abdomen: Bowel sounds are positive, abdomen soft and non-tender  Msk:  Back normal, normal gait. Normal strength and tone for age. Extremities: No clubbing, cyanosis or edema.   Neuro: Alert and oriented X 3. Psych:  Good affect, responds appropriately  Labs:   Lab Results  Component Value Date   WBC 26.1 (H) 05/18/2019   HGB 13.9 06/10/2019  HCT 39.9 05/28/2019   MCV 90.9 06/14/2019   PLT 377 06/14/2019    Recent Labs  Lab 05/18/2019 2125  NA 134*  K 2.8*  CL 96*  CO2 19*  BUN 19  CREATININE 1.55*  CALCIUM 8.7*  PROT 8.2*  BILITOT 0.9  ALKPHOS 134*  ALT 241*  AST 292*  GLUCOSE 171*   No results found for: CKTOTAL, CKMB, CKMBINDEX, TROPONINI  Lab Results  Component Value Date   CHOL 187 05/22/2019   CHOL 183 09/18/2018   CHOL 180 04/09/2016   Lab Results  Component Value Date   HDL 76.40 05/22/2019   HDL 82 09/18/2018   HDL 80 04/09/2016   Lab Results  Component Value Date   LDLCALC 93 05/22/2019   LDLCALC 88 09/18/2018   LDLCALC 79 04/09/2016   Lab Results  Component Value Date   TRIG 147 05/21/2019   TRIG 91.0 05/22/2019   TRIG 71 09/18/2018   Lab Results  Component Value Date   CHOLHDL 2 05/22/2019   CHOLHDL 2.2 09/18/2018   CHOLHDL 2.3 04/09/2016   No results found for: LDLDIRECT    Radiology: CARDIAC CATHETERIZATION  Result Date: 05/17/2019 1.  Cardiac arrest 2.  Normal coronary anatomy 3.  Mild to moderate reduced left ventricular function Recommendations 1.  Admit to ICU 2.  Continue mechanical ventilation 3.  2D echocardiogram  DG Chest Portable 1 View  Result Date: 05/19/2019 CLINICAL DATA:  Cardiac arrest. EXAM: PORTABLE CHEST 1 VIEW COMPARISON:  September 28, 2014 FINDINGS: An endotracheal tube is seen with its distal tip approximately 2.1 cm from the carina. A nasogastric tube is noted with its distal end seen within the body of the stomach. Mild to moderate severity bilateral suprahilar and right infrahilar areas of atelectasis  and/or infiltrate are noted. There is no evidence of a pleural effusion or pneumothorax. There is moderate to marked severity enlargement of the cardiac silhouette. Degenerative changes seen within the thoracic spine. IMPRESSION: 1. Endotracheal tube and nasogastric tube in satisfactory position. 2. Moderate to marked severity bilateral atelectasis and/or infiltrate. 3. Moderate to marked severity cardiomegaly. Electronically Signed   By: Virgina Norfolk M.D.   On: 05/27/2019 22:01    EKG: Sinus tachycardia with nonspecific intraventricular conduction delay  ASSESSMENT AND PLAN:   1.  Witnessed arrest, quired CPR and ACLS with intubation and mechanical ventilation, with apparent V. fib requiring cardioversion x3, currently in sinus tachycardia with nonspecific intraventricular conduction delay  Recommendations  1.  Emergent cardiac catheterization, and possible primary PCI  Signed: Isaias Cowman MD,PhD, [facility] 05/16/2019, 11:16 PM

## 2019-06-07 NOTE — ED Notes (Signed)
Date and time results received: 06/10/2019   Test: troponin Critical Value: 225  Name of Provider Notified: Charna Archer

## 2019-06-07 NOTE — ED Notes (Signed)
cardiologist at bedside

## 2019-06-07 NOTE — ED Notes (Signed)
MD states pt is a Code STEMI

## 2019-06-07 NOTE — ED Notes (Signed)
Pt to cath lab.

## 2019-06-07 NOTE — ED Notes (Signed)
On further evaluation and communication with MD. EMS informed this nurse that there was an unknown exact down time and unknown time to CPR start from dispatch report. MD Charna Archer states he was told by same EMS that event was witnessed. Family to be brought back to room for MD to speak and ask details of events.

## 2019-06-07 NOTE — ED Notes (Signed)
Cooling started at this time.

## 2019-06-07 NOTE — ED Provider Notes (Signed)
[facility] Emergency Department Provider Note   ____________________________________________   First MD Initiated Contact with Patient 05/26/2019 2126     (approximate)  I have reviewed the triage vital signs and the nursing notes.   HISTORY  Chief Complaint Post Cardiac Arrest    HPI Meghan Young is a 59 y.o. female with possible history of COPD, hypertension, and depression who presents to the ED following cardiac arrest.  History is limited as patient is unresponsive.  Per EMS, she was sitting outside on the porch when she stopped breathing, turned blue, and collapsed to the ground.  Bystanders called 911 and were instructed to perform CPR, which was initiated by family.  Patient noted to be in ventricular fibrillation following arrival of the fire department, defibrillation was performed twice with CPR ongoing.  EMS then arrived and defibrillated patient for a third time, after which ROSC was achieved.  She received multiple rounds of epinephrine prior to arrival, but did not receive any amiodarone.  She did have some respiratory effort prior to arrival which was assisted with bag-valve-mask.        Past Medical History:  Diagnosis Date  . Allergy   . Anxiety   . Blood in stool   . Cataracts, bilateral    surgery right eye 01/2017   . Chicken pox   . Depression   . GERD (gastroesophageal reflux disease)   . Papilloma of nose    with h/o nose spur   . UTI (urinary tract infection)     Patient Active Problem List   Diagnosis Date Noted  . Cardiac arrest ([place]) 06/04/2019  . Fatty liver 05/07/2019  . Essential hypertension 05/07/2019  . Fatigue 09/17/2018  . Annual physical exam 09/17/2018  . Vasomotor symptoms due to menopause 09/11/2018  . Prediabetes 04/10/2018  . Skin lesion 10/25/2017  . Vitamin D deficiency 10/25/2017  . Elevated liver enzymes 10/25/2017  . Hot flashes 10/25/2017  . Morbid obesity with BMI of 40.0-44.9, adult ([place])  10/25/2017  . Anxiety and depression 06/17/2017  . Edema 06/17/2017  . Palpitations 06/17/2017  . Allergic rhinitis 06/17/2017  . GERD (gastroesophageal reflux disease) 06/17/2017  . Guaiac positive stools 06/05/2017  . Rectal bleeding 06/05/2017    Past Surgical History:  Procedure Laterality Date  . ABDOMINAL HYSTERECTOMY     partial   . APPENDECTOMY    . CATARACT EXTRACTION Right 01/23/2017   right eye only   . cataract surgery     01/2017 [place]  . cataract surgery     02/02/19 [place] left   . CHOLECYSTECTOMY    . TONSILLECTOMY    . TONSILLECTOMY    . TOTAL VAGINAL HYSTERECTOMY  2002   due to uterine prolapse  . TUBAL LIGATION      Prior to Admission medications   Medication Sig Start Date End Date Taking? Authorizing Provider  ALPRAZolam Duanne Moron) 1 MG tablet taking 0.5 mg bid and 1 mg qhs prn 03/13/19  Yes McLean-Scocuzza, Nino Glow, MD  fluticasone (FLONASE) 50 MCG/ACT nasal spray 1 or 2 sprays each nostril twice a day 06/14/17  Yes McLean-Scocuzza, Nino Glow, MD  furosemide (LASIX) 40 MG tablet Take 1 tablet (40 mg total) by mouth daily as needed. 11/18/18  Yes McLean-Scocuzza, Nino Glow, MD  olmesartan-hydrochlorothiazide (BENICAR HCT) 20-12.5 MG tablet Take 1 tablet by mouth daily. In am 05/07/19  Yes McLean-Scocuzza, Nino Glow, MD  omeprazole (PRILOSEC) 40 MG capsule Take 1 capsule (40 mg total) by mouth daily.  11/12/18  Yes McLean-Scocuzza, Nino Glow, MD  venlafaxine XR (EFFEXOR-XR) 150 MG 24 hr capsule Take 1 capsule (150 mg total) by mouth daily with breakfast. 02/11/19  Yes McLean-Scocuzza, Nino Glow, MD  albuterol (PROVENTIL HFA;VENTOLIN HFA) 108 (90 Base) MCG/ACT inhaler Inhale 2 puffs into the lungs every 6 (six) hours as needed for wheezing or shortness of breath. Patient not taking: Reported on 05/07/2019 10/25/17   McLean-Scocuzza, Nino Glow, MD  albuterol (PROVENTIL) (2.5 MG/3ML) 0.083% nebulizer solution Take 3 mLs (2.5 mg total) by nebulization every 6 (six) hours as needed for  wheezing or shortness of breath. Patient not taking: Reported on 05/07/2019 10/25/17   McLean-Scocuzza, Nino Glow, MD  b complex vitamins tablet Take 1 tablet by mouth daily.    [provider]    Allergies Klonopin [clonazepam]  Family History  Problem Relation Age of Onset  . Raynaud syndrome Mother   . Arthritis Mother   . Depression Mother   . Anxiety disorder Mother   . Early death Father        age 78? reason  . Depression Daughter   . Hypertension Son   . Arthritis Maternal Grandmother   . Hypertension Maternal Grandmother   . Miscarriages / Stillbirths Maternal Grandmother   . Depression Maternal Grandfather   . Drug abuse Maternal Grandfather   . Early death Maternal Grandfather   . Heart disease Maternal Grandfather        tachycardia   . Arthritis Paternal Grandmother   . Hypertension Paternal Grandmother   . Early death Paternal Grandfather   . Prostate cancer Maternal Uncle 66  . Prostate cancer Cousin   . Breast cancer Neg Hx     Social History Social History   Tobacco Use  . Smoking status: Former Smoker    Quit date: 04/30/2013    Years since quitting: 6.1  . Smokeless tobacco: Never Used  Substance Use Topics  . Alcohol use: Yes    Alcohol/week: 7.0 standard drinks    Types: 7 Standard drinks or equivalent per week    Comment: daily  . Drug use: No    Review of Systems Unable to obtain secondary to patient unresponsive.  ____________________________________________   PHYSICAL EXAM:  VITAL SIGNS: ED Triage Vitals [05/23/2019 2125]  Enc Vitals Group     BP 135/78     Pulse Rate (!) 102     Resp 16     Temp      Temp src      SpO2 92 %     Weight      Height      Head Circumference      Peak Flow      Pain Score      Pain Loc      Pain Edu?      Excl. in [place]?     Constitutional: Unresponsive. Eyes: Conjunctivae are normal. Head: Atraumatic. Nose: No congestion/rhinnorhea. Mouth/Throat: Mucous membranes are moist, emesis  in oropharynx. Neck: Normal ROM Cardiovascular: Tachycardic, regular rhythm. Grossly normal heart sounds. Respiratory: Gasping respiratory effort. Gastrointestinal: Soft and nondistended. Genitourinary: deferred Musculoskeletal: No lower extremity edema. Neurologic: No spontaneous movements noted. Skin:  Skin is cool and dry. Psychiatric: Unable to assess.  ____________________________________________   LABS (all labs ordered are listed, but only abnormal results are displayed)  Labs Reviewed  COMPREHENSIVE METABOLIC PANEL - Abnormal; Notable for the following components:      Result Value   Sodium 134 (*)    Potassium  2.8 (*)    Chloride 96 (*)    CO2 19 (*)    Glucose, Bld 171 (*)    Creatinine, Ser 1.55 (*)    Calcium 8.7 (*)    Total Protein 8.2 (*)    AST 292 (*)    ALT 241 (*)    Alkaline Phosphatase 134 (*)    GFR calc non Af Amer 37 (*)    GFR calc Af Amer 43 (*)    Anion gap 19 (*)    All other components within normal limits  CBC WITH DIFFERENTIAL/PLATELET - Abnormal; Notable for the following components:   WBC 26.1 (*)    Neutro Abs 16.0 (*)    Lymphs Abs 7.4 (*)    Monocytes Absolute 1.3 (*)    Abs Immature Granulocytes 1.13 (*)    All other components within normal limits  BRAIN NATRIURETIC PEPTIDE - Abnormal; Notable for the following components:   B Natriuretic Peptide 130.8 (*)    All other components within normal limits  BLOOD GAS, ARTERIAL - Abnormal; Notable for the following components:   pH, Arterial 7.19 (*)    pO2, Arterial 66 (*)    Bicarbonate 17.2 (*)    Acid-base deficit 10.8 (*)    All other components within normal limits  TROPONIN I (HIGH SENSITIVITY) - Abnormal; Notable for the following components:   Troponin I (High Sensitivity) 225 (*)    All other components within normal limits  SARS CORONAVIRUS 2 BY RT PCR (HOSPITAL ORDER, [place] [facility])  TRIGLYCERIDES  PROTIME-INR  PATHOLOGIST SMEAR REVIEW   PROTIME-INR  APTT  APTT  BLOOD GAS, ARTERIAL  BLOOD GAS, ARTERIAL  CBC  BLOOD GAS, ARTERIAL  MAGNESIUM  PHOSPHORUS  MAGNESIUM  PHOSPHORUS  URINE DRUG SCREEN, QUALITATIVE ([facility] ONLY)  PROCALCITONIN  LIPID PANEL  HEMOGLOBIN A1C  TROPONIN I (HIGH SENSITIVITY)   ____________________________________________  EKG  ED ECG REPORT I, Blake Divine, the attending physician, personally viewed and interpreted this ECG.   Date: 06/06/2019  EKG Time: 21:24  Rate: 102  Rhythm: sinus tachycardia  Axis: LAD  Intervals:nonspecific intraventricular conduction delay  ST&T Change: STE in aVR, diffuse ST depressions   PROCEDURES  Procedure(s) performed (including Critical Care):  .Critical Care Performed by: Blake Divine, MD Authorized by: Blake Divine, MD   Critical care provider statement:    Critical care time (minutes):  45   Critical care time was exclusive of:  Separately billable procedures and treating other patients and teaching time   Critical care was necessary to treat or prevent imminent or life-threatening deterioration of the following conditions:  Circulatory failure   Critical care was time spent personally by me on the following activities:  Discussions with consultants, evaluation of patient's response to treatment, examination of patient, ordering and performing treatments and interventions, ordering and review of laboratory studies, ordering and review of radiographic studies, pulse oximetry, re-evaluation of patient's condition, obtaining history from patient or surrogate and review of old charts   I assumed direction of critical care for this patient from another provider in my specialty: no   Procedure Name: Intubation Date/Time: 06/06/2019 11:25 PM Performed by: Blake Divine, MD Pre-anesthesia Checklist: Patient identified, Patient being monitored, Emergency Drugs available, Timeout performed and Suction available Oxygen Delivery Method: Ambu  bag Preoxygenation: Pre-oxygenation with 100% oxygen Induction Type: Rapid sequence Ventilation: Mask ventilation without difficulty Laryngoscope Size: Mac and 4 Grade View: Grade I Tube size: 7.5 mm Number of attempts: 1 Airway  Equipment and Method: Video-laryngoscopy Placement Confirmation: ETT inserted through vocal cords under direct vision,  CO2 detector and Breath sounds checked- equal and bilateral Secured at: 23 cm Tube secured with: ETT holder Dental Injury: Teeth and Oropharynx as per pre-operative assessment         ____________________________________________   INITIAL IMPRESSION / ASSESSMENT AND PLAN / ED COURSE       59 year old female with history of COPD, hypertension, and depression presents to the ED following cardiac arrest, received defibrillation x3 in the field for ventricular fibrillation and noted to have ROSC following third attempt.  She arrives with gasping respirations being assisted with BVM but otherwise no purposeful movements.  She was intubated without difficulty and monitor shows tachycardia with nonspecific intraventricular conduction delay.  She was loaded with amiodarone and started on drip, started on propofol for sedation.  EKG most suggestive of diffuse ischemia without clear STEMI, however given V. fib arrest, case was discussed with Dr. Saralyn Pilar of cardiology.  Decision was made to call case as code STEMI and take patient to the Cath Lab.  ICU provider was also notified and will plan on cooling protocol once patient arrives to the ICU.      ____________________________________________   FINAL CLINICAL IMPRESSION(S) / ED DIAGNOSES  Final diagnoses:  Cardiac arrest [facility])     ED Discharge Orders    None       Note:  This document was prepared using Dragon voice recognition software and may include unintentional dictation errors.   Blake Divine, MD 06/14/2019 [phone]

## 2019-06-07 NOTE — H&P (Addendum)
Name: Meghan Young MRN: [id number] DOB: December 30, 1960    ADMISSION DATE:  05/25/2019 CONSULTATION DATE: 05/21/2019  REFERRING MD : Dr. Charna Archer   CHIEF COMPLAINT: Cardiac Arrest   BRIEF PATIENT DESCRIPTION:  59 yo female admitted post V.fib arrest mechanically intubated   SIGNIFICANT EVENTS/STUDIES:  05/23: Pt admitted to ICU post v.fib arrest hypothermic protocol initiated _0  degrees C  05/23: Cardiac catheterization revealed normal coronary anatomy and mild to moderate reduced left ventricular function 05/23: CT Head revealed   HISTORY OF PRESENT ILLNESS:   This is a 59 yo female with a PMH of UTI, HTN, Elevated Liver Enzymes, Papilloma of Nose, GERD, Depression, Bilateral Cataracts, Anxiety, and Allergies.  [facility]23 post cardiac arrest. Pts husband reported while he was outside doing landscaping a neighbor noticed the pt slumped down in a swing and unresponsive therefore, he performed CPR immediately. He thought he could feel a pulse after a few minutes of CPR. When EMS arrived on the scene initial cardiac rhythm ventricular fibrillation, pt required defibrillation x3 prior to [place] with estimated downtime of 20 minutes.  Upon arrival to the ER pt required emergent mechanical intubation due to acute respiratory failure and it was noted pt absent of purposeful movement. Code STEMI initiated, upon Cardiologist Dr. Josefa Half evaluation pt transported for emergent cardiac catheterization although pt did not meet STEMI criteria due to EKG revealing sinus tachycardia with nonspecific conduction delay.  Initial ER labs were: Na+ 134, K+ 2.8, chloride 96, CO2 19, creatinine 1.55, calcium 8.7, anion gap 19, alk phos 134, AST 292, ALT 241, BNP 130.8, troponin 225, wbc 26.1, and post intubation abg pH 7.19/pCO2 45/pO2 66/acid-base deficit 10.8/bicarb 17.2.  CXR concerning for bilateral atelectasis vs. infiltrates.  Prior to transport to cardiac cath lab pt received  amiodarone bolus and amiodarone gtt started.  PCCM team contacted by ER physician for ICU admission.    PAST MEDICAL HISTORY :   has a past medical history of Allergy, Anxiety, Blood in stool, Cataracts, bilateral, Chicken pox, Depression, GERD (gastroesophageal reflux disease), Papilloma of nose, and UTI (urinary tract infection).  has a past surgical history that includes Tonsillectomy; Cholecystectomy; Appendectomy; Tubal ligation; Total vaginal hysterectomy (2002); Cataract extraction (Right, 01/23/2017); cataract surgery; Tonsillectomy; Abdominal hysterectomy; and cataract surgery. Prior to Admission medications   Medication Sig Start Date End Date Taking? Authorizing Provider  ALPRAZolam Duanne Moron) 1 MG tablet taking 0.5 mg bid and 1 mg qhs prn 03/13/19  Yes McLean-Scocuzza, Nino Glow, MD  fluticasone (FLONASE) 50 MCG/ACT nasal spray 1 or 2 sprays each nostril twice a day 06/14/17  Yes McLean-Scocuzza, Nino Glow, MD  furosemide (LASIX) 40 MG tablet Take 1 tablet (40 mg total) by mouth daily as needed. 11/18/18  Yes McLean-Scocuzza, Nino Glow, MD  olmesartan-hydrochlorothiazide (BENICAR HCT) 20-12.5 MG tablet Take 1 tablet by mouth daily. In am 05/07/19  Yes McLean-Scocuzza, Nino Glow, MD  omeprazole (PRILOSEC) 40 MG capsule Take 1 capsule (40 mg total) by mouth daily. 11/12/18  Yes McLean-Scocuzza, Nino Glow, MD  venlafaxine XR (EFFEXOR-XR) 150 MG 24 hr capsule Take 1 capsule (150 mg total) by mouth daily with breakfast. 02/11/19  Yes McLean-Scocuzza, Nino Glow, MD  albuterol (PROVENTIL HFA;VENTOLIN HFA) 108 (90 Base) MCG/ACT inhaler Inhale 2 puffs into the lungs every 6 (six) hours as needed for wheezing or shortness of breath. Patient not taking: Reported on 05/07/2019 10/25/17   McLean-Scocuzza, Nino Glow, MD  albuterol (PROVENTIL) (2.5 MG/3ML) 0.083% nebulizer solution  Take 3 mLs (2.5 mg total) by nebulization every 6 (six) hours as needed for wheezing or shortness of breath. Patient not taking: Reported on  05/07/2019 10/25/17   McLean-Scocuzza, Nino Glow, MD  b complex vitamins tablet Take 1 tablet by mouth daily.    [provider]   Allergies  Allergen Reactions  . Klonopin [Clonazepam]     Did not tolerate     FAMILY HISTORY:  family history includes Anxiety disorder in her mother; Arthritis in her maternal grandmother, mother, and paternal grandmother; Depression in her daughter, maternal grandfather, and mother; Drug abuse in her maternal grandfather; Early death in her father, maternal grandfather, and paternal grandfather; Heart disease in her maternal grandfather; Hypertension in her maternal grandmother, paternal grandmother, and son; Miscarriages / Stillbirths in her maternal grandmother; Prostate cancer in her cousin; Prostate cancer (age of onset: 58) in her maternal uncle; Raynaud syndrome in her mother. SOCIAL HISTORY:  reports that she quit smoking about 6 years ago. She has never used smokeless tobacco. She reports current alcohol use of about 7.0 standard drinks of alcohol per week. She reports that she does not use drugs.  REVIEW OF SYSTEMS:   Unable to assess pt intubated   SUBJECTIVE:  Unable to assess pt intubated  VITAL SIGNS: Temp:  [96.9 F (36.1 C)-97.7 F (36.5 C)] 97.7 F (36.5 C) (05/23 2220) Pulse Rate:  [102-111] 109 (05/23 2220) Resp:  [16-23] 22 (05/23 2220) BP: (110-143)/(67-98) 142/91 (05/23 2220) SpO2:  [92 %-93 %] 93 % (05/23 2220) Weight:  [120 kg] 120 kg (05/23 2131)  PHYSICAL EXAMINATION: General: acutely ill appearing female, mechanically intubated NAD  Neuro: sedated, not following commands, PERRL  HEENT: supple, no JVD  Cardiovascular: sinus tachycardia with nonspecific conduction delay  Lungs: rhonchi with expiratory wheezes throughout, even, non labored  Abdomen: +BS x4, obese, soft, non distended  Musculoskeletal: normal bulk and tone, no edema  Skin: intact no rashes or lesions present   Recent Labs  Lab 05/31/2019 2125  NA  134*  K 2.8*  CL 96*  CO2 19*  BUN 19  CREATININE 1.55*  GLUCOSE 171*   Recent Labs  Lab 05/23/2019 2125  HGB 13.9  HCT 39.9  WBC 26.1*  PLT 377   DG Chest Portable 1 View  Result Date: 05/21/2019 CLINICAL DATA:  Cardiac arrest. EXAM: PORTABLE CHEST 1 VIEW COMPARISON:  September 28, 2014 FINDINGS: An endotracheal tube is seen with its distal tip approximately 2.1 cm from the carina. A nasogastric tube is noted with its distal end seen within the body of the stomach. Mild to moderate severity bilateral suprahilar and right infrahilar areas of atelectasis and/or infiltrate are noted. There is no evidence of a pleural effusion or pneumothorax. There is moderate to marked severity enlargement of the cardiac silhouette. Degenerative changes seen within the thoracic spine. IMPRESSION: 1. Endotracheal tube and nasogastric tube in satisfactory position. 2. Moderate to marked severity bilateral atelectasis and/or infiltrate. 3. Moderate to marked severity cardiomegaly. Electronically Signed   By: Virgina Norfolk M.D.   On: 06/06/2019 22:01    ASSESSMENT / PLAN:  Acute hypoxic respiratory failure post V.fib arrest  Mechanical Intubation  Full vent support for now-vent settings reviewed and established SBT once all parameters met  VAP bundle implemented  Hypothermic protocol _0  degrees C   V.fib arrest etiology unclear (Cardiac catheterization 05/16/2019 revealed normal coronary anatomy and mild to moderate reduced left ventricular function) Cardiogenic shock  Hx: HTN  Cardiology consulted appreciate input-pt  to undergo emergent cardiac cath  Continuous telemetry monitoring  Continue amiodarone gtt  Prn levophed gtt to maintain map >70 Hold outpatient antihypertensives for now  Echo pending  Urine drug screen, lipid panel, and hemoglobin A1c pending   Acute renal failure  Hypokalemia  Anion gap metabolic acidosis  Trend BMP and ABG  Replace electrolytes as indicated  Monitor UOP   Avoid nephrotoxic medications  Sodium bicarb gtt _0  ml/hr   Elevated liver enzymes secondary to cardiogenic shock along with fatty liver  Trend hepatic panel   Possible pneumonia vs. Atelectasis  Trend WBC and monitor fever curve Will check PCT if elevated will start abx therapy   Hyperglycemia  CBG q4hrs  SSI   Acute encephalopathy post vfib arrest concerning for possible anoxic injury  During cooling phase of hypothermic protocol maintain RASS goal of -5 Continue nimbex, propofol, and fentanyl gtts to maintain RASS goal  EEG pending  WUA once hypothermic protocol completed, if purposeful movement not present will repeat CT Head   Best Practice: Code status: Full code  Disposition: ICU VTE px: subq lovenox SUP px: iv pepcid  Diet: keep NPO for now   Updated pts husband regarding pt condition and current plan of care.  He informed me the pt has received Moderna COVID-19 Vaccine (received both doses).  All questions were answered  -Case discussed with ICU Intensivist Dr. Galen Daft via telephone, and she agreed with plan of care as outlined above.  Marda Stalker, [place] Pager [phone] (please enter 7 digits) PCCM Consult Pager [phone] (please enter 7 digits)

## 2019-06-07 NOTE — ED Notes (Signed)
Pt ready for cath lab at this time 

## 2019-06-07 NOTE — ED Triage Notes (Signed)
Pt arrives to ED [facility] home [facility] due to a post cardiac arrest. Pt was shocked x3 pta. Pt was reported to be in VFib PTA. On arrival pt was no oriented. Pt arrived with MD and fellow nurses at bedside, intubated after arrival.

## 2019-06-07 NOTE — ED Notes (Signed)
X RAY at bedside 

## 2019-06-08 ENCOUNTER — Inpatient Hospital Stay
Admit: 2019-06-08 | Discharge: 2019-06-08 | Disposition: A | Discharge status: Home / Self Care | Payer: Managed Care, Other (non HMO) | Attending: Cardiology | Admitting: Cardiology

## 2019-06-08 ENCOUNTER — Encounter: Payer: Self-pay | Admitting: Cardiology

## 2019-06-08 ENCOUNTER — Inpatient Hospital Stay: Payer: Managed Care, Other (non HMO)

## 2019-06-08 DIAGNOSIS — I469 Cardiac arrest, cause unspecified: Secondary | ICD-10-CM

## 2019-06-08 LAB — BASIC METABOLIC PANEL
Anion gap: 12 (ref 5–15)
Anion gap: 12 (ref 5–15)
Anion gap: 13 (ref 5–15)
Anion gap: 17 — ABNORMAL HIGH (ref 5–15)
BUN: 14 mg/dL (ref 6–20)
BUN: 18 mg/dL (ref 6–20)
BUN: 19 mg/dL (ref 6–20)
BUN: 19 mg/dL (ref 6–20)
CO2: 22 mmol/L (ref 22–32)
CO2: 23 mmol/L (ref 22–32)
CO2: 24 mmol/L (ref 22–32)
CO2: 24 mmol/L (ref 22–32)
Calcium: 7.3 mg/dL — ABNORMAL LOW (ref 8.9–10.3)
Calcium: 7.3 mg/dL — ABNORMAL LOW (ref 8.9–10.3)
Calcium: 7.5 mg/dL — ABNORMAL LOW (ref 8.9–10.3)
Calcium: 7.6 mg/dL — ABNORMAL LOW (ref 8.9–10.3)
Chloride: 96 mmol/L — ABNORMAL LOW (ref 98–111)
Chloride: 97 mmol/L — ABNORMAL LOW (ref 98–111)
Chloride: 97 mmol/L — ABNORMAL LOW (ref 98–111)
Chloride: 98 mmol/L (ref 98–111)
Creatinine, Ser: 0.54 mg/dL (ref 0.44–1.00)
Creatinine, Ser: 0.71 mg/dL (ref 0.44–1.00)
Creatinine, Ser: 0.76 mg/dL (ref 0.44–1.00)
Creatinine, Ser: 0.77 mg/dL (ref 0.44–1.00)
GFR calc Af Amer: >60 mL/min (ref >60)
GFR calc Af Amer: >60 mL/min (ref >60)
GFR calc Af Amer: >60 mL/min (ref >60)
GFR calc Af Amer: >60 mL/min (ref >60)
GFR calc non Af Amer: >60 mL/min (ref >60)
GFR calc non Af Amer: >60 mL/min (ref >60)
GFR calc non Af Amer: >60 mL/min (ref >60)
GFR calc non Af Amer: >60 mL/min (ref >60)
Glucose, Bld: 171 mg/dL — ABNORMAL HIGH (ref 70–99)
Glucose, Bld: 174 mg/dL — ABNORMAL HIGH (ref 70–99)
Glucose, Bld: 177 mg/dL — ABNORMAL HIGH (ref 70–99)
Glucose, Bld: 178 mg/dL — ABNORMAL HIGH (ref 70–99)
Potassium: 2.3 mmol/L — CL (ref 3.5–5.1)
Potassium: 3.1 mmol/L — ABNORMAL LOW (ref 3.5–5.1)
Potassium: 3.4 mmol/L — ABNORMAL LOW (ref 3.5–5.1)
Potassium: 3.6 mmol/L (ref 3.5–5.1)
Sodium: 132 mmol/L — ABNORMAL LOW (ref 135–145)
Sodium: 133 mmol/L — ABNORMAL LOW (ref 135–145)
Sodium: 134 mmol/L — ABNORMAL LOW (ref 135–145)
Sodium: 136 mmol/L (ref 135–145)

## 2019-06-08 LAB — CBC
HCT: 37.3 % (ref 36.0–46.0)
Hemoglobin: 13.7 g/dL (ref 12.0–15.0)
MCH: 32.1 pg (ref 26.0–34.0)
MCHC: 36.7 g/dL — ABNORMAL HIGH (ref 30.0–36.0)
MCV: 87.4 fL (ref 80.0–100.0)
Platelets: 316 10*3/uL (ref 150–400)
RBC: 4.27 MIL/uL (ref 3.87–5.11)
RDW: 13 % (ref 11.5–15.5)
WBC: 18.1 10*3/uL — ABNORMAL HIGH (ref 4.0–10.5)
nRBC: 0 % (ref 0.0–0.2)

## 2019-06-08 LAB — BLOOD GAS, ARTERIAL
Acid-base deficit: 6 mmol/L — ABNORMAL HIGH (ref 0.0–2.0)
Acid-base deficit: 9.8 mmol/L — ABNORMAL HIGH (ref 0.0–2.0)
Bicarbonate: 17.6 mmol/L — ABNORMAL LOW (ref 20.0–28.0)
Bicarbonate: 19.6 mmol/L — ABNORMAL LOW (ref 20.0–28.0)
FIO2: 1
FIO2: 100
MECHVT: 450 mL
MECHVT: 450 mL
Mechanical Rate: 20
O2 Saturation: 91.5 %
O2 Saturation: 99.3 %
PEEP: 5 cmH2O
Patient temperature: 37
Patient temperature: 37
RATE: 20 resp/min
pCO2 arterial: 38 mmHg (ref 32.0–48.0)
pCO2 arterial: 43 mmHg (ref 32.0–48.0)
pH, Arterial: 7.22 — ABNORMAL LOW (ref 7.350–7.450)
pH, Arterial: 7.32 — ABNORMAL LOW (ref 7.350–7.450)
pO2, Arterial: 156 mmHg — ABNORMAL HIGH (ref 83.0–108.0)
pO2, Arterial: 75 mmHg — ABNORMAL LOW (ref 83.0–108.0)

## 2019-06-08 LAB — URINE DRUG SCREEN, QUALITATIVE (ARMC ONLY)
Amphetamines, Ur Screen: NOT DETECTED
Barbiturates, Ur Screen: NOT DETECTED
Benzodiazepine, Ur Scrn: POSITIVE — AB
Cannabinoid 50 Ng, Ur ~~LOC~~: NOT DETECTED
Cocaine Metabolite,Ur ~~LOC~~: NOT DETECTED
MDMA (Ecstasy)Ur Screen: NOT DETECTED
Methadone Scn, Ur: NOT DETECTED
Opiate, Ur Screen: NOT DETECTED
Phencyclidine (PCP) Ur S: NOT DETECTED
Tricyclic, Ur Screen: NOT DETECTED

## 2019-06-08 LAB — LIPID PANEL
Cholesterol: 193 mg/dL (ref 0–200)
HDL: 80 mg/dL (ref >40)
LDL Cholesterol: 34 mg/dL (ref 0–99)
Total CHOL/HDL Ratio: 2.4 RATIO
Triglycerides: 397 mg/dL — ABNORMAL HIGH (ref <150)
VLDL: 79 mg/dL — ABNORMAL HIGH (ref 0–40)

## 2019-06-08 LAB — PHOSPHORUS: Phosphorus: 2.8 mg/dL (ref 2.5–4.6)

## 2019-06-08 LAB — PROTIME-INR
INR: 1.1 (ref 0.8–1.2)
INR: 1.1 (ref 0.8–1.2)
Prothrombin Time: 13.7 seconds (ref 11.4–15.2)
Prothrombin Time: 14.1 seconds (ref 11.4–15.2)

## 2019-06-08 LAB — PATHOLOGIST SMEAR REVIEW

## 2019-06-08 LAB — GLUCOSE, CAPILLARY
Glucose-Capillary: 158 mg/dL — ABNORMAL HIGH (ref 70–99)
Glucose-Capillary: 159 mg/dL — ABNORMAL HIGH (ref 70–99)
Glucose-Capillary: 165 mg/dL — ABNORMAL HIGH (ref 70–99)
Glucose-Capillary: 166 mg/dL — ABNORMAL HIGH (ref 70–99)
Glucose-Capillary: 167 mg/dL — ABNORMAL HIGH (ref 70–99)
Glucose-Capillary: 176 mg/dL — ABNORMAL HIGH (ref 70–99)
Glucose-Capillary: 74 mg/dL (ref 70–99)

## 2019-06-08 LAB — TROPONIN I (HIGH SENSITIVITY)
Troponin I (High Sensitivity): 6364 ng/L (ref <18)
Troponin I (High Sensitivity): 4965 ng/L (ref <18)
Troponin I (High Sensitivity): 4582 ng/L (ref <18)

## 2019-06-08 LAB — MRSA PCR SCREENING: MRSA by PCR: NEGATIVE

## 2019-06-08 LAB — HEMOGLOBIN A1C
Hgb A1c MFr Bld: 5.4 % (ref 4.8–5.6)
Mean Plasma Glucose: 108.28 mg/dL

## 2019-06-08 LAB — APTT
aPTT: 29 seconds (ref 24–36)
aPTT: 29 seconds (ref 24–36)

## 2019-06-08 LAB — SARS CORONAVIRUS 2 BY RT PCR (HOSPITAL ORDER, PERFORMED IN ~~LOC~~ HOSPITAL LAB): SARS Coronavirus 2: NEGATIVE

## 2019-06-08 LAB — POTASSIUM: Potassium: 3.5 mmol/L (ref 3.5–5.1)

## 2019-06-08 LAB — MAGNESIUM: Magnesium: 2.5 mg/dL — ABNORMAL HIGH (ref 1.7–2.4)

## 2019-06-08 LAB — PROCALCITONIN: Procalcitonin: 0.64 ng/mL

## 2019-06-08 MED ORDER — CHLORHEXIDINE GLUCONATE 0.12% ORAL RINSE (MEDLINE KIT)
15.0000 mL | Freq: Two times a day (BID) | OROMUCOSAL | Status: DC
  Administered 2019-06-08 – 2019-06-11 (×7): 15 mL via OROMUCOSAL

## 2019-06-08 MED ORDER — SODIUM CHLORIDE 0.9 % IV SOLN: 250.0000 mL | INTRAVENOUS | Status: DC | PRN

## 2019-06-08 MED ORDER — CALCIUM GLUCONATE-NACL 1-0.675 GM/50ML-% IV SOLN
1.0000 g | Freq: Once | INTRAVENOUS | Status: AC
  Administered 2019-06-08: 1000 mg via INTRAVENOUS
  Filled 2019-06-08: qty 50

## 2019-06-08 MED ORDER — CHLORHEXIDINE GLUCONATE CLOTH 2 % EX PADS
6.0000 | MEDICATED_PAD | Freq: Every day | CUTANEOUS | Status: DC
  Administered 2019-06-08 – 2019-06-11 (×3): 6 via TOPICAL

## 2019-06-08 MED ORDER — SODIUM CHLORIDE 0.9% FLUSH: 3.0000 mL | INTRAVENOUS | Status: DC | PRN

## 2019-06-08 MED ORDER — HYDRALAZINE HCL 20 MG/ML IJ SOLN: 10.0000 mg | INTRAMUSCULAR | Status: AC | PRN

## 2019-06-08 MED ORDER — SODIUM CHLORIDE 0.9% FLUSH
3.0000 mL | Freq: Two times a day (BID) | INTRAVENOUS | Status: DC
  Administered 2019-06-08 – 2019-06-10 (×3): 3 mL via INTRAVENOUS

## 2019-06-08 MED ORDER — MAGNESIUM SULFATE 2 GM/50ML IV SOLN
INTRAVENOUS | Status: AC
  Administered 2019-06-08: 1 g
  Filled 2019-06-08: qty 50

## 2019-06-08 MED ORDER — SODIUM BICARBONATE 8.4 % IV SOLN
INTRAVENOUS | Status: AC
  Filled 2019-06-08: qty 100

## 2019-06-08 MED ORDER — POTASSIUM CHLORIDE 2 MEQ/ML IV SOLN
INTRAVENOUS | Status: DC
  Filled 2019-06-08 (×3): qty 1000

## 2019-06-08 MED ORDER — SODIUM CHLORIDE 0.9 % WEIGHT BASED INFUSION: 1.0000 mL/kg/h | INTRAVENOUS | Status: AC

## 2019-06-08 MED ORDER — ONDANSETRON HCL 4 MG/2ML IJ SOLN: 4.0000 mg | Freq: Four times a day (QID) | INTRAMUSCULAR | Status: DC | PRN

## 2019-06-08 MED ORDER — NOREPINEPHRINE 16 MG/250ML-% IV SOLN
0.0000 ug/min | INTRAVENOUS | Status: DC
  Administered 2019-06-08: 30 ug/min via INTRAVENOUS
  Administered 2019-06-09: 50 ug/min via INTRAVENOUS
  Administered 2019-06-09: 46 ug/min via INTRAVENOUS
  Administered 2019-06-09: 22 ug/min via INTRAVENOUS
  Administered 2019-06-10: 70 ug/min via INTRAVENOUS
  Administered 2019-06-10: 50 ug/min via INTRAVENOUS
  Administered 2019-06-10 (×3): 70 ug/min via INTRAVENOUS
  Administered 2019-06-10: 50 ug/min via INTRAVENOUS
  Administered 2019-06-11 (×2): 70 ug/min via INTRAVENOUS
  Filled 2019-06-08 (×14): qty 250

## 2019-06-08 MED ORDER — SODIUM BICARBONATE 8.4 % IV SOLN
100.0000 meq | Freq: Once | INTRAVENOUS | Status: AC
  Administered 2019-06-08: 100 meq via INTRAVENOUS

## 2019-06-08 MED ORDER — LABETALOL HCL 5 MG/ML IV SOLN: 10.0000 mg | INTRAVENOUS | Status: AC | PRN

## 2019-06-08 MED ORDER — POTASSIUM CL IN DEXTROSE 5% 20 MEQ/L IV SOLN
20.0000 meq | INTRAVENOUS | Status: DC
  Administered 2019-06-08: 20 meq via INTRAVENOUS
  Filled 2019-06-08 (×2): qty 1000

## 2019-06-08 MED ORDER — ORAL CARE MOUTH RINSE
15.0000 mL | OROMUCOSAL | Status: DC
  Administered 2019-06-08 – 2019-06-11 (×28): 15 mL via OROMUCOSAL

## 2019-06-08 MED ORDER — POTASSIUM CHLORIDE IN NACL 20-0.9 MEQ/L-% IV SOLN
INTRAVENOUS | Status: DC
  Filled 2019-06-08 (×2): qty 1000

## 2019-06-08 MED ORDER — ACETAMINOPHEN 325 MG PO TABS: 650.0000 mg | ORAL_TABLET | ORAL | Status: DC | PRN

## 2019-06-08 MED ORDER — POTASSIUM CHLORIDE 10 MEQ/50ML IV SOLN
10.0000 meq | INTRAVENOUS | Status: AC
  Administered 2019-06-08 (×6): 10 meq via INTRAVENOUS
  Filled 2019-06-08 (×6): qty 50

## 2019-06-08 NOTE — Consult Note (Signed)
[place] for Electrolyte Monitoring and Replacement   Recent Labs: Potassium (mmol/L)  Date Value  06/08/2019 2.3 (LL)   Magnesium (mg/dL)  Date Value  06/08/2019 2.5 (H)   Calcium (mg/dL)  Date Value  06/08/2019 7.6 (L)   Albumin (g/dL)  Date Value  05/25/2019 4.5  09/18/2018 4.7   Phosphorus (mg/dL)  Date Value  06/08/2019 2.8   Sodium (mmol/L)  Date Value  06/08/2019 136  09/18/2018 142   Corrected Ca: 7.2 mg/dL Follow-Up K (1416): 3.5 mmol/L  Assessment: 59 yo female with a PMH of UTI, HTN, Elevated Liver Enzymes, Papilloma of Nose, GERD, Depression, Bilateral Cataracts, Anxiety, and Allergies.  She presented to [facility] ER via EMS from home on 05/23 post cardiac arrest. Following the above electrolyte levels she received 60 mEq IV KCl   Goal of Therapy:  Potassium 4.0 - 5.1 mmol/L Magnesium 2.0 - 2.4 mg/dL All Other Electrolytes WNL  Plan:   Add 40 mEq KCl/L to MIVF (D5) and continue at 50 mL/hr  1 gram IV calcium gluconate  Next electrolytes in am  Dallie Piles ,PharmD Clinical Pharmacist 06/08/2019 2:45 PM

## 2019-06-08 NOTE — Progress Notes (Signed)
Multiple attempts to remove patients wedding ring and unable. Remainder of jewelry (two toe rings, two earrings) removed and at bedside. Will advise husband to take home

## 2019-06-08 NOTE — Progress Notes (Signed)
[facility]    SUBJECTIVE: Unable to perform ROS as patient is intubated, mechanically ventilated and sedated.   Vitals:   06/08/19 0630 06/08/19 0645 06/08/19 0700 06/08/19 0800  BP: 111/63 115/62 109/63 103/80  Pulse: 65 65 64 64  Resp: 20 20 20 20   Temp: (!) 91.4 F (33 C) (!) 91.2 F (32.9 C) (!) 91.4 F (33 C) (!) 91.8 F (33.2 C)  TempSrc: Bladder Bladder Bladder Bladder  SpO2: 100% 100% 100% 100%  Weight:      Height:         Intake/Output Summary (Last 24 hours) at 06/08/2019 0834 Last data filed at 06/08/2019 0800 Gross per 24 hour  Intake 2070.31 ml  Output 1625 ml  Net 445.31 ml      PHYSICAL EXAM  General: Critically ill appearing, mechanically ventilated HEENT:  Normocephalic and atramatic Neck:  No JVD.  Lungs: mechanically ventilated Heart: HRRR. Normal S1 and S2 without gallops or murmurs.  Abdomen: nondistended Extremities: No clubbing, cyanosis or edema.   Neuro: sedated Psych:  sedated   LABS: Basic Metabolic Panel: Recent Labs    06/05/2019 2125 06/08/19 0404  NA 134* 136  K 2.8* 2.3*  CL 96* 97*  CO2 19* 22  GLUCOSE 171* 178*  BUN 19 19  CREATININE 1.55* 0.76  CALCIUM 8.7* 7.6*  MG  --  2.5*  PHOS  --  2.8   Liver Function Tests: Recent Labs    05/25/2019 2125  AST 292*  ALT 241*  ALKPHOS 134*  BILITOT 0.9  PROT 8.2*  ALBUMIN 4.5   No results for input(s): LIPASE, AMYLASE in the last 72 hours. CBC: Recent Labs    05/31/2019 2125 06/08/19 0404  WBC 26.1* 18.1*  NEUTROABS 16.0*  --   HGB 13.9 13.7  HCT 39.9 37.3  MCV 90.9 87.4  PLT 377 316   Cardiac Enzymes: No results for input(s): CKTOTAL, CKMB, CKMBINDEX, TROPONINI in the last 72 hours. BNP: Invalid input(s): POCBNP D-Dimer: No results for input(s): DDIMER in the last 72 hours. Hemoglobin A1C: No results for input(s): HGBA1C in the last 72 hours. Fasting Lipid Panel: Recent Labs    06/08/19 0404  CHOL 193  HDL 80  LDLCALC 34  TRIG 397*  CHOLHDL 2.4    Thyroid Function Tests: No results for input(s): TSH, T4TOTAL, T3FREE, THYROIDAB in the last 72 hours.  Invalid input(s): FREET3 Anemia Panel: No results for input(s): VITAMINB12, FOLATE, FERRITIN, TIBC, IRON, RETICCTPCT in the last 72 hours.  CT Head Wo Contrast  Result Date: 06/08/2019 CLINICAL DATA:  Post cardiac arrest patient unresponsive EXAM: CT HEAD WITHOUT CONTRAST TECHNIQUE: Contiguous axial images were obtained from the base of the skull through the vertex without intravenous contrast. COMPARISON:  None. FINDINGS: Brain: No evidence of acute infarction, hemorrhage, hydrocephalus, extra-axial collection or mass lesion/mass effect. Symmetric prominence of the ventricles, cisterns and sulci compatible with frontal lobe predominant parenchymal volume loss. Patchy areas of white matter hypoattenuation are most compatible with chronic microvascular angiopathy. Vascular: Minimal plaque in the carotid siphons. No abnormal hyperdense vessel. Skull: No calvarial fracture or suspicious osseous lesion. No scalp swelling or hematoma. Sinuses/Orbits: Minimal thickening in the paranasal sinuses, predominantly the ethmoids may be secondary to recent instrumentation. No layering air-fluid levels. Orbital structures are unremarkable aside from prior lens extractions. Other: None IMPRESSION: 1. No acute intracranial findings. 2. Mild frontal lobe predominant parenchymal volume loss and chronic microvascular angiopathy. Electronically Signed   By: Elwin Sleight.D.  On: 06/08/2019 00:00   CT Angio Chest PE W/Cm &/Or Wo Cm  Result Date: 05/27/2019 CLINICAL DATA:  Shortness of breath. COPD. Cardiac arrest. EXAM: CT ANGIOGRAPHY CHEST WITH CONTRAST TECHNIQUE: Multidetector CT imaging of the chest was performed using the standard protocol during bolus administration of intravenous contrast. Multiplanar CT image reconstructions and MIPs were obtained to evaluate the vascular anatomy. CONTRAST:  40mL OMNIPAQUE  IOHEXOL 350 MG/ML SOLN COMPARISON:  08/31/2010 FINDINGS: Cardiovascular: Evaluation for pulmonary emboli is limited by respiratory motion artifact and streak artifact from the patient's arms.Given these significant limitations, no acute pulmonary embolism was detected. The main pulmonary artery is within normal limits for size. There is no CT evidence of acute right heart strain. The visualized aorta is normal. Heart size is enlarged. There is no significant pericardial effusion. Mediastinum/Nodes: --No mediastinal or hilar lymphadenopathy. --No axillary lymphadenopathy. --No supraclavicular lymphadenopathy. --Normal thyroid gland. --The esophagus is unremarkable Lungs/Pleura: The endotracheal tube terminates just above the carina. The tube is pointed towards the right mainstem bronchus. Lung volumes are low. There is significant bibasilar atelectasis. There is some mild interlobular septal thickening. There is no pneumothorax. There are trace bilateral pleural effusions. Upper Abdomen: The enteric tube extends into the stomach. The tip is not visualized on this study. There is hepatic steatosis. Musculoskeletal: Multiple nondisplaced buckle fractures are noted of the anterior ribs bilaterally. There is no evidence for a fracture of the sternum. Review of the MIP images confirms the above findings. IMPRESSION: 1. Evaluation for pulmonary emboli is limited by respiratory motion artifact and streak artifact from the patient's arms. Given these significant limitations, no acute pulmonary embolism was detected. 2. The endotracheal tube terminates just above the carina. The tube is pointed towards the right mainstem bronchus. Repositioning should be considered. 3. Low lung volumes with significant bibasilar atelectasis. 4. Trace bilateral pleural effusions. 5. Cardiomegaly with mild interlobular septal thickening, suggestive of interstitial pulmonary edema. 6. Hepatic steatosis. 7. Multiple nondisplaced buckle fractures  of the anterior ribs bilaterally. No evidence for a fracture of the sternum. No pneumothorax. These results will be called to the ordering clinician or representative by the Radiologist Assistant, and communication documented in the PACS or [employer]. Electronically Signed   By: Constance Holster M.D.   On: 06/12/2019 23:59   CARDIAC CATHETERIZATION  Result Date: 06/08/2019 1.  Cardiac arrest 2.  Normal coronary anatomy 3.  Mild to moderate reduced left ventricular function Recommendations 1.  Admit to ICU 2.  Continue mechanical ventilation 3.  2D echocardiogram  DG Chest Port 1 View  Result Date: 06/08/2019 CLINICAL DATA:  Endotracheal tube repositioning. Central line placement. EXAM: PORTABLE CHEST 1 VIEW COMPARISON:  Jun 07, 2019 FINDINGS: The endotracheal tube terminates approximately 3 cm above the carina. There is a new left-sided central venous catheter with tip projecting over the SVC. The heart size remains enlarged. There is no pneumothorax. Low lung volumes with bibasilar atelectasis are noted. IMPRESSION: 1. Lines and tubes as above.  No pneumothorax. 2. Otherwise, no significant short interval change. Electronically Signed   By: Constance Holster M.D.   On: 06/08/2019 01:39   DG Chest Portable 1 View  Result Date: 05/28/2019 CLINICAL DATA:  Cardiac arrest. EXAM: PORTABLE CHEST 1 VIEW COMPARISON:  September 28, 2014 FINDINGS: An endotracheal tube is seen with its distal tip approximately 2.1 cm from the carina. A nasogastric tube is noted with its distal end seen within the body of the stomach. Mild to moderate severity bilateral suprahilar and  right infrahilar areas of atelectasis and/or infiltrate are noted. There is no evidence of a pleural effusion or pneumothorax. There is moderate to marked severity enlargement of the cardiac silhouette. Degenerative changes seen within the thoracic spine. IMPRESSION: 1. Endotracheal tube and nasogastric tube in satisfactory position. 2.  Moderate to marked severity bilateral atelectasis and/or infiltrate. 3. Moderate to marked severity cardiomegaly. Electronically Signed   By: Virgina Norfolk M.D.   On: 06/06/2019 22:01     Echo Pending  TELEMETRY: sinus rhythm, 60s  ASSESSMENT AND PLAN:  Active Problems:   Cardiac arrest ([place])    1. Status post witnessed cardiac arrest, requiring CPR and ACLS, intubation and mechanical ventilation with apparent ventricular fibrillation requiring cardioversion x 3, now on amiodarone drip, currently in sinus rhythm with rates in the 60s. Emergent cardiac catheterization revealed normal coronary anatomy with mild to moderate left ventricular dysfunction. Chest CT negative for evidence of acute PE. 2. Hypokalemia, K 2.3 this morning, being supplemented 3. Apparent ventricular fibrillation requiring cardioversion x 3, currently on amiodarone drip.  Recommendations: 1. 2D echocardiogram to assess LV function 2. Continue amiodarone drip at this time 3. Replenish potassium 4. Further recommendations pending results of echo and patient's initial course.  Clabe Seal, PA-C 06/08/2019 8:34 AM   Discussed with Dr. Saralyn Pilar who agrees with above plan.

## 2019-06-08 NOTE — Progress Notes (Signed)
eeg completed ° °

## 2019-06-08 NOTE — Progress Notes (Signed)
*  PRELIMINARY RESULTS* Echocardiogram 2D Echocardiogram has been performed.  Meghan Young 06/08/2019, 9:42 AM

## 2019-06-08 NOTE — Progress Notes (Signed)
   06/05/2019 2251  Clinical Encounter Type  Visited With Patient  Visit Type Initial  Referral From Nurse  Consult/Referral To Chaplain  Spiritual Encounters  Spiritual Needs Other (Comment)  A[place]a responded to stemi page. Pt was in Cathlab. Waited several minutes but didn't see anyone in Cathlab area. Follow up needed.

## 2019-06-08 NOTE — Progress Notes (Signed)
11:30 - Perry visited pt.'s rm. while rounding on ICU as follow-up from [place].  Pt.'s husband arrived just as [place] was praying silently at door; said Pt. collapsed while sitting in chair yesterday afternoon -- very little warning: pt. seemed fine and then suddenly put her hands to her head and fell back.  Husband called 83 and began CPR; EMS continued CPR when they arrived --> pt. brought to ED, sent to Cath lab, admitted to ICU.  Husband is concerned re: possibility of brain damage before pt. was revived.  Husband said he enjoyed talking to distract himself from situation; shared he and his wife care  for their 4yo granddtr.  Husband is retired, pt. works --> husband spends a lot of time w/granddtr.  Husband concerned b/c granddtr. witnessed pt.'s collapse, CPR, etc.  Husband awaits news/update from MD; [facility] will continue to monitor pt. family needs.   06/08/19 1130  Clinical Encounter Type  Visited With Family  Visit Type Initial;Psychological support;Social support;Critical Care  Referral From Other (Comment);Chaplain (Routine Rounding)  Consult/Referral To Chaplain  Spiritual Encounters  Spiritual Needs Emotional  Stress Factors  Family Stress Factors Loss of control;Lack of knowledge;Health changes;Major life changes

## 2019-06-08 NOTE — Procedures (Signed)
ELECTROENCEPHALOGRAM REPORT   Patient: Meghan Young       Room #: IC17A-AA EEG No. ID: [id number] Age: 59 y.o.        Sex: female Requesting Physician: Kasa Report Date:  06/08/2019        Interpreting Physician: Alexis Goodell  History: Meghan Young is an 59 y.o. female s/p arrest  Medications:  Nimbex, Fentanyl, Propofol, Levophed, Insulin  Conditions of Recording:  This is a 21 channel routine scalp EEG performed with bipolar and monopolar montages arranged in accordance to the international 10/20 system of electrode placement. One channel was dedicated to EKG recording.  The patient is in the intubated and sedated state.  Description:  The background activity is low voltage, slow and poorly organized.  There are periods of symmetrical, fairly well organized beta activity that are in a distribution that suggests symmetrical sleep spindles.  At times though this activity is only seen over the left parietal region and takes on the appearance of a sharp wave.in that focal region.  There is no evidence of electrographic seizures.  Hyperventilation and intermittent photic stimulation were not performed  IMPRESSION: This electroencephalogram is characterized by activity that is suggestive of sleep with some focal activity in the left parietal region that may suggest an irritable focus in that region.  Clinical/neurological, and radiographic correlation advised.  There is no evidence of electrographic seizures.   Alexis Goodell, MD Neurology [phone] 06/08/2019, 2:05 PM

## 2019-06-08 NOTE — Progress Notes (Signed)
Pt paralyzed, sedated, on a vent, tolerating well.  EEG performed, and sedation was increased per recommendation from Neurology.  Potassium trending up, while sodium is trending down; pharmacy to manage electrolytes.  Pt remains at target temperature, no shivering noted throughout shift.  Started CVP monitoring, and family updated on plan of care by Mortimer Fries, MD.  [facility] contacted, requested status updates if patient's neurological status changes.  Ref# [id number]

## 2019-06-08 NOTE — Progress Notes (Addendum)
CRITICAL CARE NOTE  59 yo WF with s/p cardiac arrest witnessed CPR 20-30 mins On Hypothermia Protocol with multiorgan failure +ETOH USE and low potassium, was out doors possible heat exhaustion  5/23 admitted for cardiac arrest 5/24 multiorgan failure, remains on hypothermia protocol  CC  follow up respiratory failure  SUBJECTIVE Patient remains critically ill Prognosis is guarded Target TEMP 33 degrees On paralytics    BP 120/84   Pulse 62   Temp (!) 91.9 F (33.3 C) (Core)   Resp 20   Ht '5\' 6"'  (1.676 m)   Wt 121.1 kg   SpO2 100%   BMI 43.09 kg/m    I/O last 3 completed shifts: In: 1767.3 [I.V.:1489.2; IV Piggyback:278] Out: 1050 [Urine:1050] Total I/O In: 806.4 [I.V.:671.4; IV Piggyback:135] Out: 650 [Urine:650]  SpO2: 100 % FiO2 (%): 100 %  Estimated body mass index is 43.09 kg/m as calculated from the following:   Height as of this encounter: '5\' 6"'  (1.676 m).   Weight as of this encounter: 121.1 kg.  SIGNIFICANT EVENTS   REVIEW OF SYSTEMS  PATIENT IS UNABLE TO PROVIDE COMPLETE REVIEW OF SYSTEMS DUE TO SEVERE CRITICAL ILLNESS        PHYSICAL EXAMINATION:  GENERAL:critically ill appearing, +resp distress HEAD: Normocephalic, atraumatic.  EYES: Pupils equal, round, reactive to light.  No scleral icterus.  MOUTH: Moist mucosal membrane. NECK: Supple.  PULMONARY: +rhonchi, +wheezing CARDIOVASCULAR: S1 and S2. Regular rate and rhythm. No murmurs, rubs, or gallops.  GASTROINTESTINAL: Soft, nontender, -distended.  Positive bowel sounds.   MUSCULOSKELETAL: No swelling, clubbing, or edema.  NEUROLOGIC: obtunded, GCS<8 SKIN:intact,warm,dry  MEDICATIONS: I have reviewed all medications and confirmed regimen as documented   CULTURE RESULTS   Recent Results (from the past 240 hour(s))  SARS Coronavirus 2 by RT PCR (hospital order, performed in [facility] hospital lab) Nasopharyngeal Nasopharyngeal Swab     Status: None   Collection Time: 06/06/2019   9:16 PM   Specimen: Nasopharyngeal Swab  Result Value Ref Range Status   SARS Coronavirus 2 NEGATIVE NEGATIVE Final    Comment: (NOTE) SARS-CoV-2 target nucleic acids are NOT DETECTED. The SARS-CoV-2 RNA is generally detectable in upper and lower respiratory specimens during the acute phase of infection. The lowest concentration of SARS-CoV-2 viral copies this assay can detect is 250 copies / mL. A negative result does not preclude SARS-CoV-2 infection and should not be used as the sole basis for treatment or other patient management decisions.  A negative result may occur with improper specimen collection / handling, submission of specimen other than nasopharyngeal swab, presence of viral mutation(s) within the areas targeted by this assay, and inadequate number of viral copies (<250 copies / mL). A negative result must be combined with clinical observations, patient history, and epidemiological information. Fact Sheet for Patients:   StrictlyIdeas.no Fact Sheet for Healthcare Providers: BankingDealers.co.za This test is not yet approved or cleared  by the [place] FDA and has been authorized for detection and/or diagnosis of SARS-CoV-2 by FDA under an Emergency Use Authorization (EUA).  This EUA will remain in effect (meaning this test can be used) for the duration of the COVID-19 declaration under Section 564(b)(1) of the Act, 21 U.S.C. section 360bbb-3(b)(1), unless the authorization is terminated or revoked sooner. Performed at [facility], [address] [place], [place] [address]   MRSA PCR Screening     Status: None   Collection Time: 06/08/19  3:15 AM   Specimen: Urine, Catheterized; Nasopharyngeal  Result Value  Ref Range Status   MRSA by PCR NEGATIVE NEGATIVE Final    Comment:        The GeneXpert MRSA Assay (FDA approved for NASAL specimens only), is one component of a comprehensive MRSA  colonization surveillance program. It is not intended to diagnose MRSA infection nor to guide or monitor treatment for MRSA infections. Performed at [facility], [address] [place], [place] [address]           IMAGING    CT Head Wo Contrast  Result Date: 06/08/2019 CLINICAL DATA:  Post cardiac arrest patient unresponsive EXAM: CT HEAD WITHOUT CONTRAST TECHNIQUE: Contiguous axial images were obtained from the base of the skull through the vertex without intravenous contrast. COMPARISON:  None. FINDINGS: Brain: No evidence of acute infarction, hemorrhage, hydrocephalus, extra-axial collection or mass lesion/mass effect. Symmetric prominence of the ventricles, cisterns and sulci compatible with frontal lobe predominant parenchymal volume loss. Patchy areas of white matter hypoattenuation are most compatible with chronic microvascular angiopathy. Vascular: Minimal plaque in the carotid siphons. No abnormal hyperdense vessel. Skull: No calvarial fracture or suspicious osseous lesion. No scalp swelling or hematoma. Sinuses/Orbits: Minimal thickening in the paranasal sinuses, predominantly the ethmoids may be secondary to recent instrumentation. No layering air-fluid levels. Orbital structures are unremarkable aside from prior lens extractions. Other: None IMPRESSION: 1. No acute intracranial findings. 2. Mild frontal lobe predominant parenchymal volume loss and chronic microvascular angiopathy. Electronically Signed   By: Lovena Le M.D.   On: 06/08/2019 00:00   CT Angio Chest PE W/Cm &/Or Wo Cm  Result Date: 06/06/2019 CLINICAL DATA:  Shortness of breath. COPD. Cardiac arrest. EXAM: CT ANGIOGRAPHY CHEST WITH CONTRAST TECHNIQUE: Multidetector CT imaging of the chest was performed using the standard protocol during bolus administration of intravenous contrast. Multiplanar CT image reconstructions and MIPs were obtained to evaluate the vascular anatomy. CONTRAST:  39m OMNIPAQUE  IOHEXOL 350 MG/ML SOLN COMPARISON:  08/31/2010 FINDINGS: Cardiovascular: Evaluation for pulmonary emboli is limited by respiratory motion artifact and streak artifact from the patient's arms.Given these significant limitations, no acute pulmonary embolism was detected. The main pulmonary artery is within normal limits for size. There is no CT evidence of acute right heart strain. The visualized aorta is normal. Heart size is enlarged. There is no significant pericardial effusion. Mediastinum/Nodes: --No mediastinal or hilar lymphadenopathy. --No axillary lymphadenopathy. --No supraclavicular lymphadenopathy. --Normal thyroid gland. --The esophagus is unremarkable Lungs/Pleura: The endotracheal tube terminates just above the carina. The tube is pointed towards the right mainstem bronchus. Lung volumes are low. There is significant bibasilar atelectasis. There is some mild interlobular septal thickening. There is no pneumothorax. There are trace bilateral pleural effusions. Upper Abdomen: The enteric tube extends into the stomach. The tip is not visualized on this study. There is hepatic steatosis. Musculoskeletal: Multiple nondisplaced buckle fractures are noted of the anterior ribs bilaterally. There is no evidence for a fracture of the sternum. Review of the MIP images confirms the above findings. IMPRESSION: 1. Evaluation for pulmonary emboli is limited by respiratory motion artifact and streak artifact from the patient's arms. Given these significant limitations, no acute pulmonary embolism was detected. 2. The endotracheal tube terminates just above the carina. The tube is pointed towards the right mainstem bronchus. Repositioning should be considered. 3. Low lung volumes with significant bibasilar atelectasis. 4. Trace bilateral pleural effusions. 5. Cardiomegaly with mild interlobular septal thickening, suggestive of interstitial pulmonary edema. 6. Hepatic steatosis. 7. Multiple nondisplaced buckle fractures  of the anterior ribs bilaterally.  No evidence for a fracture of the sternum. No pneumothorax. These results will be called to the ordering clinician or representative by the Radiologist Assistant, and communication documented in the PACS or [employer]. Electronically Signed   By: Constance Holster M.D.   On: 05/17/2019 23:59   CARDIAC CATHETERIZATION  Result Date: 06/08/2019 1.  Cardiac arrest 2.  Normal coronary anatomy 3.  Mild to moderate reduced left ventricular function Recommendations 1.  Admit to ICU 2.  Continue mechanical ventilation 3.  2D echocardiogram  DG Chest Port 1 View  Result Date: 06/08/2019 CLINICAL DATA:  Endotracheal tube repositioning. Central line placement. EXAM: PORTABLE CHEST 1 VIEW COMPARISON:  Jun 07, 2019 FINDINGS: The endotracheal tube terminates approximately 3 cm above the carina. There is a new left-sided central venous catheter with tip projecting over the SVC. The heart size remains enlarged. There is no pneumothorax. Low lung volumes with bibasilar atelectasis are noted. IMPRESSION: 1. Lines and tubes as above.  No pneumothorax. 2. Otherwise, no significant short interval change. Electronically Signed   By: Constance Holster M.D.   On: 06/08/2019 01:39   DG Chest Portable 1 View  Result Date: 05/29/2019 CLINICAL DATA:  Cardiac arrest. EXAM: PORTABLE CHEST 1 VIEW COMPARISON:  September 28, 2014 FINDINGS: An endotracheal tube is seen with its distal tip approximately 2.1 cm from the carina. A nasogastric tube is noted with its distal end seen within the body of the stomach. Mild to moderate severity bilateral suprahilar and right infrahilar areas of atelectasis and/or infiltrate are noted. There is no evidence of a pleural effusion or pneumothorax. There is moderate to marked severity enlargement of the cardiac silhouette. Degenerative changes seen within the thoracic spine. IMPRESSION: 1. Endotracheal tube and nasogastric tube in satisfactory position. 2.  Moderate to marked severity bilateral atelectasis and/or infiltrate. 3. Moderate to marked severity cardiomegaly. Electronically Signed   By: Virgina Norfolk M.D.   On: 06/04/2019 22:01     Nutrition Status:           Indwelling Urinary Catheter continued, requirement due to   Reason to continue Indwelling Urinary Catheter strict Intake/Output monitoring for hemodynamic instability   Central Line/ continued, requirement due to  Reason to continue [place] of central venous pressure or other hemodynamic parameters and poor IV access   Ventilator continued, requirement due to severe respiratory failure   Ventilator Sedation RASS 0 to -2      ASSESSMENT AND PLAN SYNOPSIS   Severe ACUTE Hypoxic and Hypercapnic Respiratory Failure cardiac arrest and severe resp failure from low potassium with heat exhaustion with alcohol abuse -continue Full MV support -continue Bronchodilator Therapy -Wean Fio2 and PEEP as tolerated -will perform SAT/SBT when respiratory parameters are met -VAP/VENT bundle implementation   ACUTE SYSTOLIC CARDIAC FAILURE- ECHO pending -oxygen as needed -Lasix as tolerated -follow up cardiac enzymes as indicated -follow up cardiology recs   Morbid obesity, possible OSA.   Will certainly impact respiratory mechanics, ventilator weaning Suspect will need to consider additional PEEP   ACUTE KIDNEY INJURY/Renal Failure -continue Foley Catheter-assess need -Avoid nephrotoxic agents -Follow urine output, BMP -Ensure adequate renal perfusion, optimize oxygenation -Renal dose medications     NEUROLOGY HYPOTHERMIA PROTOCOL  Acute toxic metabolic encephalopathy, need for sedation Goal RASS -2 to -3  SHOCK-CARDIOGENIC -use vasopressors to keep MAP>65 if needed -follow ABG and LA  CARDIAC ICU monitoring  ID -continue IV abx as prescibed -follow up cultures  GI GI PROPHYLAXIS as indicated  NUTRITIONAL STATUS  Nutrition  Status:      DIET-->NPO Constipation protocol as indicated  ENDO - will use ICU hypoglycemic\Hyperglycemia protocol if indicated     ELECTROLYTES -follow labs as needed -replace as needed -pharmacy consultation and following   DVT/GI PRX ordered and assessed TRANSFUSIONS AS NEEDED MONITOR FSBS I Assessed the need for Labs I Assessed the need for Foley I Assessed the need for Central Venous Line Family Discussion when available I Assessed the need for Mobilization I made an Assessment of medications to be adjusted accordingly Safety Risk assessment completed   CASE DISCUSSED IN MULTIDISCIPLINARY ROUNDS WITH ICU TEAM  Critical Care Time devoted to patient care services described in this note is 32 minutes.   Overall, patient is critically ill, prognosis is guarded.  Patient with Multiorgan failure and at high risk for cardiac arrest and death.    Corrin Parker, M.D.  [facility]  Medical Director [place] Director [facility] Cardio-Pulmonary Department

## 2019-06-08 NOTE — Progress Notes (Signed)
Per Dr. Mortimer Fries increased propofol to 50 mcg. MD spoke with Dr Doy Mince and that was her recommendation once she reviewed EEG.

## 2019-06-08 NOTE — Procedures (Signed)
Central Venous Catheter Insertion Procedure Note Katerra Ciufo [id number] [birth year]  Procedure: Insertion of Central Venous Catheter Indications: Assessment of intravascular volume, Drug and/or fluid administration and Frequent blood sampling  Procedure Details Consent: Risks of procedure as well as the alternatives and risks of each were explained to the (patient/caregiver).  Consent for procedure obtained. Time Out: Verified patient identification, verified procedure, site/side was marked, verified correct patient position, special equipment/implants available, medications/allergies/relevent history reviewed, required imaging and test results available.  Performed  Maximum sterile technique was used including antiseptics, cap, gloves, gown, hand hygiene, mask and sheet. Skin prep: Chlorhexidine; local anesthetic administered A antimicrobial bonded/coated triple lumen catheter was placed in the left internal jugular vein using the Seldinger technique.  Evaluation Blood flow good Complications: No apparent complications Patient did tolerate procedure well. Chest X-ray ordered to verify placement.  CXR: normal.  Left internal jugular central line placed utilizing ultrasound no complications noted during or following procedure.  Marda Stalker, [place] Pager [phone] (please enter 7 digits) PCCM Consult Pager [phone] (please enter 7 digits)

## 2019-06-08 NOTE — Progress Notes (Signed)
S/p cpr arrest brought in via ems. Patient was intubated by Dr Charna Archer without difficulty. Placed on initial vent settings in er 450/20/100/5.  Patient was taken to Cath lab for procedure. RT on airway standby throughout process. Patient then transported on vent to CT scan then resting place ICU. Patient on vent throughout multiple transports. Tolerated interventions by RT well.

## 2019-06-09 ENCOUNTER — Inpatient Hospital Stay: Payer: Managed Care, Other (non HMO)

## 2019-06-09 LAB — BASIC METABOLIC PANEL
Anion gap: 11 (ref 5–15)
Anion gap: 10 (ref 5–15)
Anion gap: 11 (ref 5–15)
Anion gap: 12 (ref 5–15)
Anion gap: 9 (ref 5–15)
Anion gap: 13 (ref 5–15)
Anion gap: 11 (ref 5–15)
BUN: 13 mg/dL (ref 6–20)
BUN: 13 mg/dL (ref 6–20)
BUN: 13 mg/dL (ref 6–20)
BUN: 12 mg/dL (ref 6–20)
BUN: 12 mg/dL (ref 6–20)
BUN: 13 mg/dL (ref 6–20)
BUN: 14 mg/dL (ref 6–20)
CO2: 23 mmol/L (ref 22–32)
CO2: 23 mmol/L (ref 22–32)
CO2: 21 mmol/L — ABNORMAL LOW (ref 22–32)
CO2: 20 mmol/L — ABNORMAL LOW (ref 22–32)
CO2: 20 mmol/L — ABNORMAL LOW (ref 22–32)
CO2: 18 mmol/L — ABNORMAL LOW (ref 22–32)
CO2: 18 mmol/L — ABNORMAL LOW (ref 22–32)
Calcium: 7.3 mg/dL — ABNORMAL LOW (ref 8.9–10.3)
Calcium: 7.3 mg/dL — ABNORMAL LOW (ref 8.9–10.3)
Calcium: 6.9 mg/dL — ABNORMAL LOW (ref 8.9–10.3)
Calcium: 6.6 mg/dL — ABNORMAL LOW (ref 8.9–10.3)
Calcium: 6.7 mg/dL — ABNORMAL LOW (ref 8.9–10.3)
Calcium: 6.6 mg/dL — ABNORMAL LOW (ref 8.9–10.3)
Calcium: 6.5 mg/dL — ABNORMAL LOW (ref 8.9–10.3)
Chloride: 98 mmol/L (ref 98–111)
Chloride: 99 mmol/L (ref 98–111)
Chloride: 100 mmol/L (ref 98–111)
Chloride: 101 mmol/L (ref 98–111)
Chloride: 102 mmol/L (ref 98–111)
Chloride: 101 mmol/L (ref 98–111)
Chloride: 102 mmol/L (ref 98–111)
Creatinine, Ser: 0.56 mg/dL (ref 0.44–1.00)
Creatinine, Ser: 0.53 mg/dL (ref 0.44–1.00)
Creatinine, Ser: 0.46 mg/dL (ref 0.44–1.00)
Creatinine, Ser: 0.6 mg/dL (ref 0.44–1.00)
Creatinine, Ser: 0.61 mg/dL (ref 0.44–1.00)
Creatinine, Ser: 0.92 mg/dL (ref 0.44–1.00)
Creatinine, Ser: 0.96 mg/dL (ref 0.44–1.00)
GFR calc Af Amer: >60 mL/min (ref >60)
GFR calc Af Amer: >60 mL/min (ref >60)
GFR calc Af Amer: >60 mL/min (ref >60)
GFR calc Af Amer: >60 mL/min (ref >60)
GFR calc Af Amer: >60 mL/min (ref >60)
GFR calc Af Amer: >60 mL/min (ref >60)
GFR calc Af Amer: >60 mL/min (ref >60)
GFR calc non Af Amer: >60 mL/min (ref >60)
GFR calc non Af Amer: >60 mL/min (ref >60)
GFR calc non Af Amer: >60 mL/min (ref >60)
GFR calc non Af Amer: >60 mL/min (ref >60)
GFR calc non Af Amer: >60 mL/min (ref >60)
GFR calc non Af Amer: >60 mL/min (ref >60)
GFR calc non Af Amer: >60 mL/min (ref >60)
Glucose, Bld: 140 mg/dL — ABNORMAL HIGH (ref 70–99)
Glucose, Bld: 152 mg/dL — ABNORMAL HIGH (ref 70–99)
Glucose, Bld: 150 mg/dL — ABNORMAL HIGH (ref 70–99)
Glucose, Bld: 149 mg/dL — ABNORMAL HIGH (ref 70–99)
Glucose, Bld: 153 mg/dL — ABNORMAL HIGH (ref 70–99)
Glucose, Bld: 134 mg/dL — ABNORMAL HIGH (ref 70–99)
Glucose, Bld: 127 mg/dL — ABNORMAL HIGH (ref 70–99)
Potassium: 3 mmol/L — ABNORMAL LOW (ref 3.5–5.1)
Potassium: 3.5 mmol/L (ref 3.5–5.1)
Potassium: 3.5 mmol/L (ref 3.5–5.1)
Potassium: 3.6 mmol/L (ref 3.5–5.1)
Potassium: 4.3 mmol/L (ref 3.5–5.1)
Potassium: 5.2 mmol/L — ABNORMAL HIGH (ref 3.5–5.1)
Potassium: 5.1 mmol/L (ref 3.5–5.1)
Sodium: 132 mmol/L — ABNORMAL LOW (ref 135–145)
Sodium: 132 mmol/L — ABNORMAL LOW (ref 135–145)
Sodium: 132 mmol/L — ABNORMAL LOW (ref 135–145)
Sodium: 133 mmol/L — ABNORMAL LOW (ref 135–145)
Sodium: 131 mmol/L — ABNORMAL LOW (ref 135–145)
Sodium: 132 mmol/L — ABNORMAL LOW (ref 135–145)
Sodium: 131 mmol/L — ABNORMAL LOW (ref 135–145)

## 2019-06-09 LAB — COMPREHENSIVE METABOLIC PANEL
ALT: 195 U/L — ABNORMAL HIGH (ref 0–44)
ALT: 198 U/L — ABNORMAL HIGH (ref 0–44)
AST: 139 U/L — ABNORMAL HIGH (ref 15–41)
AST: 139 U/L — ABNORMAL HIGH (ref 15–41)
Albumin: 2.9 g/dL — ABNORMAL LOW (ref 3.5–5.0)
Albumin: 3.2 g/dL — ABNORMAL LOW (ref 3.5–5.0)
Alkaline Phosphatase: 93 U/L (ref 38–126)
Alkaline Phosphatase: 93 U/L (ref 38–126)
Anion gap: 10 (ref 5–15)
Anion gap: 10 (ref 5–15)
BUN: 13 mg/dL (ref 6–20)
BUN: 12 mg/dL (ref 6–20)
CO2: 24 mmol/L (ref 22–32)
CO2: 23 mmol/L (ref 22–32)
Calcium: 7.2 mg/dL — ABNORMAL LOW (ref 8.9–10.3)
Calcium: 7.5 mg/dL — ABNORMAL LOW (ref 8.9–10.3)
Chloride: 100 mmol/L (ref 98–111)
Chloride: 100 mmol/L (ref 98–111)
Creatinine, Ser: 0.51 mg/dL (ref 0.44–1.00)
Creatinine, Ser: 0.43 mg/dL — ABNORMAL LOW (ref 0.44–1.00)
GFR calc Af Amer: >60 mL/min (ref >60)
GFR calc Af Amer: >60 mL/min (ref >60)
GFR calc non Af Amer: >60 mL/min (ref >60)
GFR calc non Af Amer: >60 mL/min (ref >60)
Glucose, Bld: 140 mg/dL — ABNORMAL HIGH (ref 70–99)
Glucose, Bld: 138 mg/dL — ABNORMAL HIGH (ref 70–99)
Potassium: 2.9 mmol/L — ABNORMAL LOW (ref 3.5–5.1)
Potassium: 2.9 mmol/L — ABNORMAL LOW (ref 3.5–5.1)
Sodium: 134 mmol/L — ABNORMAL LOW (ref 135–145)
Sodium: 133 mmol/L — ABNORMAL LOW (ref 135–145)
Total Bilirubin: 1.1 mg/dL (ref 0.3–1.2)
Total Bilirubin: 1.3 mg/dL — ABNORMAL HIGH (ref 0.3–1.2)
Total Protein: 5.6 g/dL — ABNORMAL LOW (ref 6.5–8.1)
Total Protein: 6 g/dL — ABNORMAL LOW (ref 6.5–8.1)

## 2019-06-09 LAB — CBC WITH DIFFERENTIAL/PLATELET
Abs Immature Granulocytes: 0.05 10*3/uL (ref 0.00–0.07)
Basophils Absolute: 0 10*3/uL (ref 0.0–0.1)
Basophils Relative: 0 %
Eosinophils Absolute: 0 10*3/uL (ref 0.0–0.5)
Eosinophils Relative: 0 %
HCT: 37.6 % (ref 36.0–46.0)
Hemoglobin: 13.6 g/dL (ref 12.0–15.0)
Immature Granulocytes: 0 %
Lymphocytes Relative: 8 %
Lymphs Abs: 1.1 10*3/uL (ref 0.7–4.0)
MCH: 31.6 pg (ref 26.0–34.0)
MCHC: 36.2 g/dL — ABNORMAL HIGH (ref 30.0–36.0)
MCV: 87.4 fL (ref 80.0–100.0)
Monocytes Absolute: 0.6 10*3/uL (ref 0.1–1.0)
Monocytes Relative: 5 %
Neutro Abs: 11.8 10*3/uL — ABNORMAL HIGH (ref 1.7–7.7)
Neutrophils Relative %: 87 %
Platelets: 238 10*3/uL (ref 150–400)
RBC: 4.3 MIL/uL (ref 3.87–5.11)
RDW: 13.1 % (ref 11.5–15.5)
WBC: 13.6 10*3/uL — ABNORMAL HIGH (ref 4.0–10.5)
nRBC: 0 % (ref 0.0–0.2)

## 2019-06-09 LAB — GLUCOSE, CAPILLARY
Glucose-Capillary: 136 mg/dL — ABNORMAL HIGH (ref 70–99)
Glucose-Capillary: 125 mg/dL — ABNORMAL HIGH (ref 70–99)
Glucose-Capillary: 134 mg/dL — ABNORMAL HIGH (ref 70–99)
Glucose-Capillary: 159 mg/dL — ABNORMAL HIGH (ref 70–99)
Glucose-Capillary: 152 mg/dL — ABNORMAL HIGH (ref 70–99)

## 2019-06-09 LAB — PHOSPHORUS
Phosphorus: 2.1 mg/dL — ABNORMAL LOW (ref 2.5–4.6)
Phosphorus: 3 mg/dL (ref 2.5–4.6)

## 2019-06-09 LAB — ECHOCARDIOGRAM COMPLETE
Height: 66 in
Weight: 4271.63 oz

## 2019-06-09 LAB — MAGNESIUM
Magnesium: 1.5 mg/dL — ABNORMAL LOW (ref 1.7–2.4)
Magnesium: 2.2 mg/dL (ref 1.7–2.4)

## 2019-06-09 MED ORDER — FUROSEMIDE 10 MG/ML IJ SOLN
INTRAMUSCULAR | Status: AC
  Filled 2019-06-09: qty 8

## 2019-06-09 MED ORDER — POTASSIUM CHLORIDE 10 MEQ/50ML IV SOLN
10.0000 meq | INTRAVENOUS | Status: DC
  Filled 2019-06-09 (×4): qty 50

## 2019-06-09 MED ORDER — LEVETIRACETAM IN NACL 1500 MG/100ML IV SOLN
1500.0000 mg | Freq: Two times a day (BID) | INTRAVENOUS | Status: DC
  Administered 2019-06-10 – 2019-06-11 (×3): 1500 mg via INTRAVENOUS
  Filled 2019-06-09 (×6): qty 100

## 2019-06-09 MED ORDER — SODIUM CHLORIDE 0.9 % IV SOLN
25.0000 ug/min | INTRAVENOUS | Status: DC
  Administered 2019-06-09: 60 ug/min via INTRAVENOUS
  Administered 2019-06-09: 65 ug/min via INTRAVENOUS
  Administered 2019-06-09: 25 ug/min via INTRAVENOUS
  Filled 2019-06-09: qty 1
  Filled 2019-06-09 (×2): qty 10
  Filled 2019-06-09: qty 1

## 2019-06-09 MED ORDER — ENOXAPARIN SODIUM 40 MG/0.4ML ~~LOC~~ SOLN
40.0000 mg | Freq: Two times a day (BID) | SUBCUTANEOUS | Status: DC
  Administered 2019-06-09 – 2019-06-10 (×4): 40 mg via SUBCUTANEOUS
  Filled 2019-06-09 (×4): qty 0.4

## 2019-06-09 MED ORDER — LACTATED RINGERS IV BOLUS
1000.0000 mL | Freq: Once | INTRAVENOUS | Status: AC
  Administered 2019-06-09: 1000 mL via INTRAVENOUS

## 2019-06-09 MED ORDER — VASOPRESSIN 20 UNIT/ML IV SOLN
0.0300 [IU]/min | INTRAVENOUS | Status: DC
  Administered 2019-06-09 – 2019-06-11 (×3): 0.03 [IU]/min via INTRAVENOUS
  Filled 2019-06-09 (×5): qty 2

## 2019-06-09 MED ORDER — FUROSEMIDE 10 MG/ML IJ SOLN
80.0000 mg | INTRAMUSCULAR | Status: AC
  Administered 2019-06-09: 80 mg via INTRAVENOUS

## 2019-06-09 MED ORDER — SODIUM CHLORIDE 0.9 % IV SOLN
3.0000 g | Freq: Four times a day (QID) | INTRAVENOUS | Status: DC
  Administered 2019-06-09 – 2019-06-11 (×6): 3 g via INTRAVENOUS
  Filled 2019-06-09 (×3): qty 8
  Filled 2019-06-09: qty 3
  Filled 2019-06-09 (×2): qty 8
  Filled 2019-06-09: qty 3
  Filled 2019-06-09: qty 8
  Filled 2019-06-09 (×3): qty 3
  Filled 2019-06-09: qty 8

## 2019-06-09 MED ORDER — PHENYLEPHRINE HCL-NACL 10-0.9 MG/250ML-% IV SOLN
25.0000 ug/min | INTRAVENOUS | Status: DC
  Filled 2019-06-09: qty 250

## 2019-06-09 MED ORDER — MAGNESIUM SULFATE 2 GM/50ML IV SOLN
2.0000 g | Freq: Once | INTRAVENOUS | Status: AC
  Administered 2019-06-09: 2 g via INTRAVENOUS
  Filled 2019-06-09: qty 50

## 2019-06-09 MED ORDER — SODIUM CHLORIDE 0.9 % IV SOLN
2000.0000 mg | Freq: Once | INTRAVENOUS | Status: AC
  Administered 2019-06-09: 2000 mg via INTRAVENOUS
  Filled 2019-06-09: qty 20

## 2019-06-09 NOTE — Progress Notes (Signed)
Portable EEG completed, results pending. 

## 2019-06-09 NOTE — Progress Notes (Signed)
[facility] Cardiology    SUBJECTIVE: Unable to be performed due to patient's critical state.   Vitals:   06/09/19 0645 06/09/19 0700 06/09/19 0741 06/09/19 0800  BP: (!) 86/69 (!) 84/69  (!) 80/64  Pulse: (!) 59 (!) 59  64  Resp: 20 20  20   Temp:  (!) 92.1 F (33.4 C)  (!) 92.5 F (33.6 C)  TempSrc:  Bladder  Bladder  SpO2: 100% 100% 100% 99%  Weight:      Height:         Intake/Output Summary (Last 24 hours) at 06/09/2019 0847 Last data filed at 06/09/2019 0600 Gross per 24 hour  Intake 2499.62 ml  Output 1302 ml  Net 1197.62 ml      PHYSICAL EXAM   General: Critically ill appearing HEENT:  Normocephalic and atramatic Neck:   No JVD.  Lungs: mechanically ventilated Heart: HRRR. Normal S1 and S2 without gallops or murmurs.  Abdomen: nondistended Extremities: No clubbing, cyanosis or edema.   Neuro: obtunded    LABS: Basic Metabolic Panel: Recent Labs    06/08/19 0404 06/08/19 1122 06/09/19 0358 06/09/19 0358 06/09/19 0600 06/09/19 0800  NA 136   < > 134*   < > 133* 132*  K 2.3*   < > 2.9*   < > 2.9* 3.0*  CL 97*   < > 100   < > 100 98  CO2 22   < > 24   < > 23 23  GLUCOSE 178*   < > 140*   < > 138* 140*  BUN 19   < > 13   < > 12 13  CREATININE 0.76   < > 0.51   < > 0.43* 0.56  CALCIUM 7.6*   < > 7.2*   < > 7.5* 7.3*  MG 2.5*  --  1.5*  --   --   --   PHOS 2.8  --  2.1*  --   --   --    < > = values in this interval not displayed.   Liver Function Tests: Recent Labs    06/09/19 0358 06/09/19 0600  AST 139* 139*  ALT 195* 198*  ALKPHOS 93 93  BILITOT 1.1 1.3*  PROT 5.6* 6.0*  ALBUMIN 2.9* 3.2*   No results for input(s): LIPASE, AMYLASE in the last 72 hours. CBC: Recent Labs    05/19/2019 2125 05/24/2019 2125 06/08/19 0404 06/09/19 0600  WBC 26.1*   < > 18.1* 13.6*  NEUTROABS 16.0*  --   --  11.8*  HGB 13.9   < > 13.7 13.6  HCT 39.9   < > 37.3 37.6  MCV 90.9   < > 87.4 87.4  PLT 377   < > 316 238   < > = values in this interval not  displayed.   Cardiac Enzymes: No results for input(s): CKTOTAL, CKMB, CKMBINDEX, TROPONINI in the last 72 hours. BNP: Invalid input(s): POCBNP D-Dimer: No results for input(s): DDIMER in the last 72 hours. Hemoglobin A1C: Recent Labs    06/08/19 0404  HGBA1C 5.4   Fasting Lipid Panel: Recent Labs    06/08/19 0404  CHOL 193  HDL 80  LDLCALC 34  TRIG 397*  CHOLHDL 2.4   Thyroid Function Tests: No results for input(s): TSH, T4TOTAL, T3FREE, THYROIDAB in the last 72 hours.  Invalid input(s): FREET3 Anemia Panel: No results for input(s): VITAMINB12, FOLATE, FERRITIN, TIBC, IRON, RETICCTPCT in the last 72 hours.  CT Head Wo Contrast  Result Date: 06/08/2019 CLINICAL DATA:  Post cardiac arrest patient unresponsive EXAM: CT HEAD WITHOUT CONTRAST TECHNIQUE: Contiguous axial images were obtained from the base of the skull through the vertex without intravenous contrast. COMPARISON:  None. FINDINGS: Brain: No evidence of acute infarction, hemorrhage, hydrocephalus, extra-axial collection or mass lesion/mass effect. Symmetric prominence of the ventricles, cisterns and sulci compatible with frontal lobe predominant parenchymal volume loss. Patchy areas of white matter hypoattenuation are most compatible with chronic microvascular angiopathy. Vascular: Minimal plaque in the carotid siphons. No abnormal hyperdense vessel. Skull: No calvarial fracture or suspicious osseous lesion. No scalp swelling or hematoma. Sinuses/Orbits: Minimal thickening in the paranasal sinuses, predominantly the ethmoids may be secondary to recent instrumentation. No layering air-fluid levels. Orbital structures are unremarkable aside from prior lens extractions. Other: None IMPRESSION: 1. No acute intracranial findings. 2. Mild frontal lobe predominant parenchymal volume loss and chronic microvascular angiopathy. Electronically Signed   By: Lovena Le M.D.   On: 06/08/2019 00:00   CT Angio Chest PE W/Cm &/Or Wo  Cm  Result Date: 06/02/2019 CLINICAL DATA:  Shortness of breath. COPD. Cardiac arrest. EXAM: CT ANGIOGRAPHY CHEST WITH CONTRAST TECHNIQUE: Multidetector CT imaging of the chest was performed using the standard protocol during bolus administration of intravenous contrast. Multiplanar CT image reconstructions and MIPs were obtained to evaluate the vascular anatomy. CONTRAST:  29mL OMNIPAQUE IOHEXOL 350 MG/ML SOLN COMPARISON:  08/31/2010 FINDINGS: Cardiovascular: Evaluation for pulmonary emboli is limited by respiratory motion artifact and streak artifact from the patient's arms.Given these significant limitations, no acute pulmonary embolism was detected. The main pulmonary artery is within normal limits for size. There is no CT evidence of acute right heart strain. The visualized aorta is normal. Heart size is enlarged. There is no significant pericardial effusion. Mediastinum/Nodes: --No mediastinal or hilar lymphadenopathy. --No axillary lymphadenopathy. --No supraclavicular lymphadenopathy. --Normal thyroid gland. --The esophagus is unremarkable Lungs/Pleura: The endotracheal tube terminates just above the carina. The tube is pointed towards the right mainstem bronchus. Lung volumes are low. There is significant bibasilar atelectasis. There is some mild interlobular septal thickening. There is no pneumothorax. There are trace bilateral pleural effusions. Upper Abdomen: The enteric tube extends into the stomach. The tip is not visualized on this study. There is hepatic steatosis. Musculoskeletal: Multiple nondisplaced buckle fractures are noted of the anterior ribs bilaterally. There is no evidence for a fracture of the sternum. Review of the MIP images confirms the above findings. IMPRESSION: 1. Evaluation for pulmonary emboli is limited by respiratory motion artifact and streak artifact from the patient's arms. Given these significant limitations, no acute pulmonary embolism was detected. 2. The endotracheal  tube terminates just above the carina. The tube is pointed towards the right mainstem bronchus. Repositioning should be considered. 3. Low lung volumes with significant bibasilar atelectasis. 4. Trace bilateral pleural effusions. 5. Cardiomegaly with mild interlobular septal thickening, suggestive of interstitial pulmonary edema. 6. Hepatic steatosis. 7. Multiple nondisplaced buckle fractures of the anterior ribs bilaterally. No evidence for a fracture of the sternum. No pneumothorax. These results will be called to the ordering clinician or representative by the Radiologist Assistant, and communication documented in the PACS or [employer]. Electronically Signed   By: Constance Holster M.D.   On: 05/21/2019 23:59   CARDIAC CATHETERIZATION  Result Date: 06/08/2019 1.  Cardiac arrest 2.  Normal coronary anatomy 3.  Mild to moderate reduced left ventricular function Recommendations 1.  Admit to ICU 2.  Continue mechanical ventilation 3.  2D echocardiogram  DG  Chest Port 1 View  Result Date: 06/08/2019 CLINICAL DATA:  Endotracheal tube repositioning. Central line placement. EXAM: PORTABLE CHEST 1 VIEW COMPARISON:  Jun 07, 2019 FINDINGS: The endotracheal tube terminates approximately 3 cm above the carina. There is a new left-sided central venous catheter with tip projecting over the SVC. The heart size remains enlarged. There is no pneumothorax. Low lung volumes with bibasilar atelectasis are noted. IMPRESSION: 1. Lines and tubes as above.  No pneumothorax. 2. Otherwise, no significant short interval change. Electronically Signed   By: Constance Holster M.D.   On: 06/08/2019 01:39   DG Chest Portable 1 View  Result Date: 05/28/2019 CLINICAL DATA:  Cardiac arrest. EXAM: PORTABLE CHEST 1 VIEW COMPARISON:  September 28, 2014 FINDINGS: An endotracheal tube is seen with its distal tip approximately 2.1 cm from the carina. A nasogastric tube is noted with its distal end seen within the body of the stomach.  Mild to moderate severity bilateral suprahilar and right infrahilar areas of atelectasis and/or infiltrate are noted. There is no evidence of a pleural effusion or pneumothorax. There is moderate to marked severity enlargement of the cardiac silhouette. Degenerative changes seen within the thoracic spine. IMPRESSION: 1. Endotracheal tube and nasogastric tube in satisfactory position. 2. Moderate to marked severity bilateral atelectasis and/or infiltrate. 3. Moderate to marked severity cardiomegaly. Electronically Signed   By: Virgina Norfolk M.D.   On: 06/02/2019 22:01   EEG adult  Result Date: 06/08/2019 Meghan Goodell, MD     06/08/2019  2:12 PM ELECTROENCEPHALOGRAM REPORT Patient: Meghan Young       Room #: IC17A-AA EEG No. ID: [id number] Age: 59 y.o.        Sex: female Requesting Physician: Kasa Report Date:  06/08/2019       Interpreting Physician: Meghan Young History: Zmya Lyng is an 59 y.o. female s/p arrest Medications: Nimbex, Fentanyl, Propofol, Levophed, Insulin Conditions of Recording:  This is a 21 channel routine scalp EEG performed with bipolar and monopolar montages arranged in accordance to the international 10/20 system of electrode placement. One channel was dedicated to EKG recording. The patient is in the intubated and sedated state. Description:  The background activity is low voltage, slow and poorly organized.  There are periods of symmetrical, fairly well organized beta activity that are in a distribution that suggests symmetrical sleep spindles.  At times though this activity is only seen over the left parietal region and takes on the appearance of a sharp wave.in that focal region.  There is no evidence of electrographic seizures. Hyperventilation and intermittent photic stimulation were not performed IMPRESSION: This electroencephalogram is characterized by activity that is suggestive of sleep with some focal activity in the left parietal region that may suggest an irritable  focus in that region.  Clinical/neurological, and radiographic correlation advised.  There is no evidence of electrographic seizures. Meghan Goodell, MD Neurology [phone] 06/08/2019, 2:05 PM   ECHOCARDIOGRAM COMPLETE  Result Date: 06/09/2019    ECHOCARDIOGRAM REPORT   Patient Name:   Meghan Young Date of Exam: 06/08/2019 Medical Rec #:  [id number]      Height:       66.0 in Accession #:    [id number]     Weight:       267.0 lb Date of Birth:  05/04/1960      BSA:          2.261 m Patient Age:    58 years       BP:  104/62 mmHg Patient Gender: F              HR:           65 bpm. Exam Location:  [facility] Procedure: 2D Echo, Color Doppler and Cardiac Doppler Indications:     I46.9 Cardiac arrest  History:         Patient has no prior history of Echocardiogram examinations.                  Risk Factors:Hypertension. No medical history.  Sonographer:     Charmayne Sheer RDCS (AE) Referring Phys:  [place] Diagnosing Phys: Yolonda Kida MD  Sonographer Comments: Suboptimal subcostal window. IMPRESSIONS  1. Left ventricular ejection fraction, by estimation, is 30 to 35%. The left ventricle has moderate to severely decreased function. The left ventricle demonstrates global hypokinesis. The left ventricular internal cavity size was moderately dilated. Left ventricular diastolic parameters are consistent with Grade I diastolic dysfunction (impaired relaxation).  2. Right ventricular systolic function is mildly reduced. The right ventricular size is mildly enlarged.  3. Left atrial size was mildly dilated.  4. Right atrial size was mildly dilated.  5. The mitral valve is grossly normal. Mild mitral valve regurgitation.  6. Large mass like lesion on TR valve free mobile , possible vegetation?Marland Kitchen The tricuspid valve is myxomatous.  7. The aortic valve is abnormal. Aortic valve regurgitation is not visualized. Conclusion(s)/Recommendation(s): Findings concerning for tricuspid valve vegetation, would  recommend a Transesophageal Echocardiogram for clarification. FINDINGS  Left Ventricle: Left ventricular ejection fraction, by estimation, is 30 to 35%. The left ventricle has moderate to severely decreased function. The left ventricle demonstrates global hypokinesis. The left ventricular internal cavity size was moderately  dilated. There is no left ventricular hypertrophy. Left ventricular diastolic parameters are consistent with Grade I diastolic dysfunction (impaired relaxation). Right Ventricle: The right ventricular size is mildly enlarged. No increase in right ventricular wall thickness. Right ventricular systolic function is mildly reduced. Left Atrium: Left atrial size was mildly dilated. Right Atrium: Right atrial size was mildly dilated. Pericardium: There is no evidence of pericardial effusion. Mitral Valve: The mitral valve is grossly normal. Mild mitral valve regurgitation. MV peak gradient, 4.4 mmHg. The mean mitral valve gradient is 2.0 mmHg. Tricuspid Valve: Large mass like lesion on TR valve free mobile , possible vegetation?Marland Kitchen The tricuspid valve is myxomatous. Tricuspid valve regurgitation is mild. Aortic Valve: The aortic valve is abnormal. Aortic valve regurgitation is not visualized. Aortic valve mean gradient measures 4.0 mmHg. Aortic valve peak gradient measures 7.5 mmHg. Aortic valve area, by VTI measures 2.04 cm. Pulmonic Valve: The pulmonic valve was normal in structure. Pulmonic valve regurgitation is not visualized. Aorta: The aortic root is normal in size and structure. IAS/Shunts: No atrial level shunt detected by color flow Doppler.  LEFT VENTRICLE PLAX 2D LVIDd:         5.12 cm      Diastology LVIDs:         4.20 cm      LV e' lateral:   7.83 cm/s LV PW:         1.04 cm      LV E/e' lateral: 9.3 LV IVS:        0.77 cm      LV e' medial:    5.00 cm/s LVOT diam:     2.00 cm      LV E/e' medial:  14.6 LV SV:  45 LV SV Index:   20 LVOT Area:     3.14 cm  LV Volumes (MOD) LV vol  d, MOD A2C: 139.0 ml LV vol d, MOD A4C: 117.0 ml LV vol s, MOD A2C: 100.0 ml LV vol s, MOD A4C: 84.6 ml LV SV MOD A2C:     39.0 ml LV SV MOD A4C:     117.0 ml LV SV MOD BP:      32.3 ml LEFT ATRIUM             Index       RIGHT ATRIUM          Index LA diam:        2.90 cm 1.28 cm/m  RA Area:     8.85 cm LA Vol (A2C):   37.4 ml 16.54 ml/m RA Volume:   17.00 ml 7.52 ml/m LA Vol (A4C):   16.4 ml 7.25 ml/m LA Biplane Vol: 26.9 ml 11.90 ml/m  AORTIC VALVE                   PULMONIC VALVE AV Area (Vmax):    1.93 cm    PV Vmax:       1.03 m/s AV Area (Vmean):   1.88 cm    PV Vmean:      65.800 cm/s AV Area (VTI):     2.04 cm    PV VTI:        0.189 m AV Vmax:           137.00 cm/s PV Peak grad:  4.2 mmHg AV Vmean:          87.000 cm/s PV Mean grad:  2.0 mmHg AV VTI:            0.222 m AV Peak Grad:      7.5 mmHg AV Mean Grad:      4.0 mmHg LVOT Vmax:         84.20 cm/s LVOT Vmean:        52.200 cm/s LVOT VTI:          0.144 m LVOT/AV VTI ratio: 0.65  AORTA Ao Root diam: 2.90 cm MITRAL VALVE MV Area (PHT): 3.17 cm     SHUNTS MV Peak grad:  4.4 mmHg     Systemic VTI:  0.14 m MV Mean grad:  2.0 mmHg     Systemic Diam: 2.00 cm MV Vmax:       1.05 m/s MV Vmean:      65.8 cm/s MV Decel Time: 239 msec MV E velocity: 72.80 cm/s MV A velocity: 109.00 cm/s MV E/A ratio:  0.67 Dwayne D Callwood MD Electronically signed by Yolonda Kida MD Signature Date/Time: 06/09/2019/8:39:38 AM    Final      Echo: Moderate to severely decreased left ventricular function with LVEF 30-35%, large mobile mass-like lesion on tricuspid valve, vegetation vs. thrombus  TELEMETRY: sinus rhythm, 60s  ASSESSMENT AND PLAN:  Active Problems:   Cardiac arrest ([place])    1. Cardiac arrest, requiring CPR, ACLS, intubation and mechanical ventilation, with apparent ventricular fibrillation requiring cardioversion x 3, on amiodarone drip. 2. Normal coronary anatomy, status post emergent cardiac catheterization 3. 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 4. Severe acute hypoxic and hypercapnic respiratory failure, on mechanical ventilator support. 5. Cardiogenic shock, on vasopressors  Recommendations: 1. Defer beta blocker, ACEi/ARB, currently on vasopressors 2. Consider TEE for evaluation of mobile tricuspid valve mass pending mental status evaluation. 3. Continue amiodarone drip for now   Clabe Seal, Hershal Coria 06/09/2019 8:47 AM   Discussed with Dr. Saralyn Pilar; plan made in collaboration with him.

## 2019-06-09 NOTE — Procedures (Addendum)
Arterial Catheter Insertion Procedure Note Meghan Young [id number] [birth year]  Procedure: Insertion of Arterial Catheter  Indications: Blood pressure monitoring and Frequent blood sampling  Procedure Details Consent: Unable to obtain consent because of altered level of consciousness. Time Out: Verified patient identification, verified procedure, site/side was marked, verified correct patient position, special equipment/implants available, medications/allergies/relevent history reviewed, required imaging and test results available.  Performed  Maximum sterile technique was used including antiseptics, cap, gloves, gown, hand hygiene, mask and sheet. Skin prep: Chlorhexidine; local anesthetic administered 20 gauge catheter was inserted into right femoral artery using the Seldinger technique. ULTRASOUND GUIDANCE USED: YES Evaluation Blood flow good; BP tracing good. Complications: No apparent complications.  Johnsie Cancel, NP-C  Pulmonary & Critical Care Contact / Pager information can be found on Amion  06/09/2019, 1:03 AM

## 2019-06-09 NOTE — Consult Note (Signed)
Pharmacy Antibiotic Note  Meghan Young is a 59 y.o. female admitted on 05/18/2019 with aspiration pneumonia.  Pharmacy has been consulted for Unasyn dosing.  Plan: Unasyn 3g IV Q6 hours  Height: 5\' 6"  (167.6 cm) Weight: 121.1 kg (266 lb 15.6 oz) IBW/kg (Calculated) : 59.3  Temp (24hrs), Avg:93.5 F (34.2 C), Min:90.5 F (32.5 C), Max:98.6 F (37 C)  Recent Labs  Lab 06/02/2019 2125 05/23/2019 2125 06/08/19 0404 06/08/19 1122 06/09/19 0600 06/09/19 0600 06/09/19 0800 06/09/19 0914 06/09/19 1200 06/09/19 1400 06/09/19 1600  WBC 26.1*  --  18.1*  --  13.6*  --   --   --   --   --   --   CREATININE 1.55*   < > 0.76   < > 0.43*   < > 0.56 0.53 0.46 0.60 0.61   < > = values in this interval not displayed.    Estimated Creatinine Clearance: 102.9 mL/min (by C-G formula based on SCr of 0.61 mg/dL).    Allergies  Allergen Reactions  . Klonopin [Clonazepam]     Did not tolerate     Antimicrobials this admission: Unasyn 5/25 >>    Microbiology results: 5/23 MRSA PCR: negative  Thank you for allowing pharmacy to be a part of this patient's care.  Pearla Dubonnet 06/09/2019 8:43 PM

## 2019-06-09 NOTE — Consult Note (Signed)
[place] for Electrolyte Monitoring and Replacement   Recent Labs: Potassium (mmol/L)  Date Value  06/09/2019 5.2 (H)   Magnesium (mg/dL)  Date Value  06/09/2019 2.2   Calcium (mg/dL)  Date Value  06/09/2019 6.6 (L)   Albumin (g/dL)  Date Value  06/09/2019 3.2 (L)  09/18/2018 4.7   Phosphorus (mg/dL)  Date Value  06/09/2019 3.0   Sodium (mmol/L)  Date Value  06/09/2019 132 (L)  09/18/2018 142     Assessment: 59 yo female with a PMH of UTI, HTN, Elevated Liver Enzymes, Papilloma of Nose, GERD, Depression, Bilateral Cataracts, Anxiety, and Allergies.  She presented to [facility] ER via EMS from home on 05/23 post cardiac arrest. She has been re-warming since approximately 0400 this am  Goal of Therapy:  Potassium 4.0 - 5.1 mmol/L Magnesium 2.0 - 2.4 mg/dL All Other Electrolytes WNL  Plan:   Potassium slightly above goal range: will hold off on order potassium binder currently.  2 grams IV magnesium sulfate this am (magnesium at goal at 1400)  Per protocol q2h BMP until 06/09/19 at 2200 then restart in am  Pearla Dubonnet ,PharmD Clinical Pharmacist 06/09/2019 9:13 PM

## 2019-06-09 NOTE — Consult Note (Signed)
[place] for Electrolyte Monitoring and Replacement   Recent Labs: Potassium (mmol/L)  Date Value  06/09/2019 5.1   Magnesium (mg/dL)  Date Value  06/09/2019 2.2   Calcium (mg/dL)  Date Value  06/09/2019 6.5 (L)   Albumin (g/dL)  Date Value  06/09/2019 3.2 (L)  09/18/2018 4.7   Phosphorus (mg/dL)  Date Value  06/09/2019 3.0   Sodium (mmol/L)  Date Value  06/09/2019 131 (L)  09/18/2018 142   Assessment: 59 yo female with a PMH of UTI, HTN, Elevated Liver Enzymes, Papilloma of Nose, GERD, Depression, Bilateral Cataracts, Anxiety, and Allergies.  She presented to [facility] ER via EMS from home on 05/23 post cardiac arrest. She has been re-warming since approximately 0400 this am  Goal of Therapy:  Potassium 4.0 - 5.1 mmol/L Magnesium 2.0 - 2.4 mg/dL All Other Electrolytes WNL  Plan:   Potassium is now at upper end of goal range.  Will f/u labs in am  Per protocol q2h BMP until 06/09/19 at 2200 then restart in am  Ena Dawley ,PharmD Clinical Pharmacist 06/09/2019 11:48 PM

## 2019-06-09 NOTE — Consult Note (Signed)
[place] for Electrolyte Monitoring and Replacement   Recent Labs: Potassium (mmol/L)  Date Value  06/09/2019 4.3   Magnesium (mg/dL)  Date Value  06/09/2019 2.2   Calcium (mg/dL)  Date Value  06/09/2019 6.7 (L)   Albumin (g/dL)  Date Value  06/09/2019 3.2 (L)  09/18/2018 4.7   Phosphorus (mg/dL)  Date Value  06/09/2019 3.0   Sodium (mmol/L)  Date Value  06/09/2019 131 (L)  09/18/2018 142     Assessment: 59 yo female with a PMH of UTI, HTN, Elevated Liver Enzymes, Papilloma of Nose, GERD, Depression, Bilateral Cataracts, Anxiety, and Allergies.  She presented to [facility] ER via EMS from home on 05/23 post cardiac arrest. She has been re-warming since approximately 0400 this am  Goal of Therapy:  Potassium 4.0 - 5.1 mmol/L Magnesium 2.0 - 2.4 mg/dL All Other Electrolytes WNL  Plan:   Potassium within goal range: no additional supplementation required  2 grams IV magnesium sulfate this am (magnesium at goal at 1400)  Per protocol q2h BMP until 06/09/19 at 2200 then restart in am  Rocky Morel ,PharmD Clinical Pharmacist 06/09/2019 5:32 PM

## 2019-06-09 NOTE — Progress Notes (Addendum)
Per lab; error with 1800 BMP and unable to result, 2000 BMP sent STAT  Patient appears to be fluid overloaded after receiving LR bolus over two hours. Lungs are coarse, CVP increased, increased pitting edema, and desatting into mid 80s, Dr Mortimer Fries notified, one time stat dose of IV lasix ordered and given   Normothermic achieved at 2000  Thick green sputum noted in ET tube, MD aware, abx ordered

## 2019-06-09 NOTE — Procedures (Signed)
ELECTROENCEPHALOGRAM REPORT   Patient: Meghan Young       Room #: IC17A-AA EEG No. ID: [id number] Age: 60 y.o.        Sex: female Requesting Physician: Kasa Report Date:  06/09/2019        Interpreting Physician: Alexis Goodell  History: Maryjose Rodebaugh is an 59 y.o. female s/p arrest  Medications:  Nimbex, Fentanyl, Propofol, Levophed, Insulin  Conditions of Recording:  This is a 21 channel routine scalp EEG performed with bipolar and monopolar montages arranged in accordance to the international 10/20 system of electrode placement. One channel was dedicated to EKG recording.  The patient is in the intubated and sedated state.   Description:  The background activity is discontinuous. It consists of generalized bursts of polyspike, spike and slow wave activity alternating with periods of attenuation. The periods of attenuation are short and generalized as well, lasing up to 1-2 seconds.  The burst activity is often of higher voltage and consisting of more sharp activity over the left parietal region.  For short bursts, at times, this activity is rhythmic.   Hyperventilation and intermittent photic stimulation were not performed  IMPRESSION: This is an abnormal EEG due to a burst-suppression pattern seen throughout the tracing with some rhythmicity noted in the left parietal region.  Burst suppression pattern can be seen in a variety of circumstances, including anesthesia, drug intoxication, hypothermia, as well as cerebral anoxia.  Clinical/neurological, and radiographic correlation advised.  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    Addendum: Results discussed with Dr. Tilman Neat, MD Neurology [phone] 06/09/2019, 1:06 PM

## 2019-06-09 NOTE — Progress Notes (Signed)
CRITICAL CARE NOTE 59 yo WF with s/p cardiac arrest witnessed CPR 20-30 mins On Hypothermia Protocol with multiorgan failure +ETOH USE and low potassium, was out doors possible heat exhaustion  5/23 admitted for cardiac arrest 5/24 multiorgan failure, remains on hypothermia protocol Family  Updated, poor prognosis 5/25 ART LINE PLACED, multiorgan failure, hypothermia protocol-rewarming phrase    CC  follow up respiratory failure  SUBJECTIVE Patient remains critically ill Prognosis is guarded Cardiogenic shock   BP (!) 86/69   Pulse (!) 59   Temp (!) 92.1 F (33.4 C) (Bladder)   Resp 20   Ht 5\' 6"  (1.676 m)   Wt 121.1 kg   SpO2 100%   BMI 43.09 kg/m    I/O last 3 completed shifts: In: 4569.9 [I.V.:3870; IV [id number] Out: 2927 [Urine:2902; Emesis/NG output:25] No intake/output data recorded.  SpO2: 100 % FiO2 (%): (S) 50 %  Estimated body mass index is 43.09 kg/m as calculated from the following:   Height as of this encounter: 5\' 6"  (1.676 m).   Weight as of this encounter: 121.1 kg.  SIGNIFICANT EVENTS   REVIEW OF SYSTEMS  PATIENT IS UNABLE TO PROVIDE COMPLETE REVIEW OF SYSTEMS DUE TO SEVERE CRITICAL ILLNESS        PHYSICAL EXAMINATION:  GENERAL:critically ill appearing, +resp distress HEAD: Normocephalic, atraumatic.  EYES: Pupils equal, round, reactive to light.  No scleral icterus.  MOUTH: Moist mucosal membrane. NECK: Supple.  PULMONARY: +rhonchi, +wheezing CARDIOVASCULAR: S1 and S2. Regular rate and rhythm. No murmurs, rubs, or gallops.  GASTROINTESTINAL: Soft, nontender, -distended.  Positive bowel sounds.   MUSCULOSKELETAL: + edema.  NEUROLOGIC: obtunded, GCS<8 SKIN:intact,warm,dry  MEDICATIONS: I have reviewed all medications and confirmed regimen as documented   CULTURE RESULTS   Recent Results (from the past 240 hour(s))  SARS Coronavirus 2 by RT PCR (hospital order, performed in [facility] hospital lab) Nasopharyngeal  Nasopharyngeal Swab     Status: None   Collection Time: 05/20/2019  9:16 PM   Specimen: Nasopharyngeal Swab  Result Value Ref Range Status   SARS Coronavirus 2 NEGATIVE NEGATIVE Final    Comment: (NOTE) SARS-CoV-2 target nucleic acids are NOT DETECTED. The SARS-CoV-2 RNA is generally detectable in upper and lower respiratory specimens during the acute phase of infection. The lowest concentration of SARS-CoV-2 viral copies this assay can detect is 250 copies / mL. A negative result does not preclude SARS-CoV-2 infection and should not be used as the sole basis for treatment or other patient management decisions.  A negative result may occur with improper specimen collection / handling, submission of specimen other than nasopharyngeal swab, presence of viral mutation(s) within the areas targeted by this assay, and inadequate number of viral copies (<250 copies / mL). A negative result must be combined with clinical observations, patient history, and epidemiological information. Fact Sheet for Patients:   StrictlyIdeas.no Fact Sheet for Healthcare Providers: BankingDealers.co.za This test is not yet approved or cleared  by the [place] FDA and has been authorized for detection and/or diagnosis of SARS-CoV-2 by FDA under an Emergency Use Authorization (EUA).  This EUA will remain in effect (meaning this test can be used) for the duration of the COVID-19 declaration under Section 564(b)(1) of the Act, 21 U.S.C. section 360bbb-3(b)(1), unless the authorization is terminated or revoked sooner. Performed at [facility], [address], [place], [place] [address]   MRSA PCR Screening     Status: None   Collection Time: 06/08/19  3:15 AM   Specimen:  Urine, Catheterized; Nasopharyngeal  Result Value Ref Range Status   MRSA by PCR NEGATIVE NEGATIVE Final    Comment:        The GeneXpert MRSA Assay (FDA approved for NASAL  specimens only), is one component of a comprehensive MRSA colonization surveillance program. It is not intended to diagnose MRSA infection nor to guide or monitor treatment for MRSA infections. Performed at [facility], [address], [place], [place] [address]           IMAGING    EEG adult  Result Date: 06/08/2019 Alexis Goodell, MD     06/08/2019  2:12 PM ELECTROENCEPHALOGRAM REPORT Patient: Meghan Young       Room #: IC17A-AA EEG No. ID: [id number] Age: 59 y.o.        Sex: female Requesting Physician: Dezaray Shibuya Report Date:  06/08/2019       Interpreting Physician: Alexis Goodell History: Meghan Young is an 59 y.o. female s/p arrest Medications: Nimbex, Fentanyl, Propofol, Levophed, Insulin Conditions of Recording:  This is a 21 channel routine scalp EEG performed with bipolar and monopolar montages arranged in accordance to the international 10/20 system of electrode placement. One channel was dedicated to EKG recording. The patient is in the intubated and sedated state. Description:  The background activity is low voltage, slow and poorly organized.  There are periods of symmetrical, fairly well organized beta activity that are in a distribution that suggests symmetrical sleep spindles.  At times though this activity is only seen over the left parietal region and takes on the appearance of a sharp wave.in that focal region.  There is no evidence of electrographic seizures. Hyperventilation and intermittent photic stimulation were not performed IMPRESSION: This electroencephalogram is characterized by activity that is suggestive of sleep with some focal activity in the left parietal region that may suggest an irritable focus in that region.  Clinical/neurological, and radiographic correlation advised.  There is no evidence of electrographic seizures. Alexis Goodell, MD Neurology [phone] 06/08/2019, 2:05 PM     Nutrition Status:       BMP Latest Ref Rng & Units 06/09/2019  06/08/2019 06/08/2019  Glucose 70 - 99 mg/dL 140(H) 171(H) 174(H)  BUN 6 - 20 mg/dL 13 14 18   Creatinine 0.44 - 1.00 mg/dL 0.51 0.54 0.77  BUN/Creat Ratio 9 - 23 - - -  Sodium 135 - 145 mmol/L 134(L) 133(L) 132(L)  Potassium 3.5 - 5.1 mmol/L 2.9(L) 3.1(L) 3.6  Chloride 98 - 111 mmol/L 100 98 96(L)  CO2 22 - 32 mmol/L 24 23 24   Calcium 8.9 - 10.3 mg/dL 7.2(L) 7.3(L) 7.5(L)       Indwelling Urinary Catheter continued, requirement due to   Reason to continue Indwelling Urinary Catheter strict Intake/Output monitoring for hemodynamic instability   Central Line/ continued, requirement due to  Reason to continue [place] of central venous pressure or other hemodynamic parameters and poor IV access   Ventilator continued, requirement due to severe respiratory failure   Ventilator Sedation RASS 0 to -2      ASSESSMENT AND PLAN SYNOPSIS  Severe ACUTE Hypoxic and Hypercapnic Respiratory Failure cardiac arrest and severe resp failure from low potassium with heat exhaustion with alcohol abuse  Severe ACUTE Hypoxic and Hypercapnic Respiratory Failure -continue Full MV support -continue Bronchodilator Therapy -Wean Fio2 and PEEP as tolerated -VAP/VENT bundle implementation  ACUTE SYSTOLIC CARDIAC FAILURE- ECHO ECHO PENDING -oxygen as needed -follow up cardiac enzymes as indicated -follow up cardiology recs   Morbid obesity,  possible OSA.   Will certainly impact respiratory mechanics, ventilator weaning Suspect will need to consider additional PEEP   ACUTE KIDNEY INJURY/Renal Failure-resolving -continue Foley Catheter-assess need -Avoid nephrotoxic agents -Follow urine output, BMP -Ensure adequate renal perfusion, optimize oxygenation -Renal dose medications     NEUROLOGY On hypothermia protocol  Acute toxic metabolic encephalopathy, need for sedation Goal RASS -2 to -3  SHOCK-CARDIOGENIC -use vasopressors to keep MAP>65 -follow ABG and LA -follow up  cultures  CARDIAC ICU monitoring    GI GI PROPHYLAXIS as indicated  NUTRITIONAL STATUS Nutrition Status:         DIET-->TF's on hold Constipation protocol as indicated  ENDO - will use ICU hypoglycemic\Hyperglycemia protocol if indicated     ELECTROLYTES -follow labs as needed -replace as needed -pharmacy consultation and following   DVT/GI PRX ordered and assessed TRANSFUSIONS AS NEEDED MONITOR FSBS I Assessed the need for Labs I Assessed the need for Foley I Assessed the need for Central Venous Line Family Discussion when available I Assessed the need for Mobilization I made an Assessment of medications to be adjusted accordingly Safety Risk assessment completed   CASE DISCUSSED IN MULTIDISCIPLINARY ROUNDS WITH ICU TEAM  Critical Care Time devoted to patient care services described in this note is 35 minutes.   Overall, patient is critically ill, prognosis is guarded.  Patient with Multiorgan failure and at high risk for cardiac arrest and death.    Corrin Parker, M.D.  [facility]  Medical Director [place] Director [facility] Cardio-Pulmonary Department

## 2019-06-09 NOTE — Consult Note (Signed)
Reason for Consult:s/p arrest Requesting Physician: Mortimer Fries  CC: Arrest  I have been asked by Dr. Mortimer Fries to see this patient in consultation for anoxic brain injury.  HPI: Meghan Young is an 59 y.o. female who is intubated, sedated and paralyzed therefore unable to provide any history and all history obtained from the chart.  Patient with a PMH of UTI, HTN, Elevated Liver Enzymes, Papilloma of Nose, GERD, Depression, Bilateral Cataracts, Anxiety, and Allergies who presented to [facility] ER via EMS from home on 05/23 post cardiac arrest. Pts husband reported while he was outside doing landscaping a neighbor noticed the pt slumped down in a swing and unresponsive therefore, he performed CPR immediately. He thought he could feel a pulse after a few minutes of CPR. When EMS arrived on the scene initial cardiac rhythm was ventricular fibrillation.  Patient required defibrillation x3 prior to ROSC with estimated downtime of 20 minutes.  Upon arrival to the ER patient required emergent mechanical intubation due to acute respiratory failure and no purposeful movement was noted. Code STEMI initiated, upon cardiologist Dr. Josefa Half evaluation and patient was transported for emergent cardiac catheterization.  Patient now undergoing hyperthermia protocol.    Past Medical History:  Diagnosis Date  . Allergy   . Anxiety   . Blood in stool   . Cataracts, bilateral    surgery right eye 01/2017   . Chicken pox   . Depression   . GERD (gastroesophageal reflux disease)   . Papilloma of nose    with h/o nose spur   . UTI (urinary tract infection)     Past Surgical History:  Procedure Laterality Date  . ABDOMINAL HYSTERECTOMY     partial   . APPENDECTOMY    . CATARACT EXTRACTION Right 01/23/2017   right eye only   . cataract surgery     01/2017 [place]  . cataract surgery     02/02/19 [place] left   . CHOLECYSTECTOMY    . CORONARY/GRAFT ACUTE MI REVASCULARIZATION N/A 06/10/2019   Procedure: Coronary/Graft Acute MI  Revascularization;  Surgeon: Isaias Cowman, MD;  Location: [place] [facility];  Service: Cardiovascular;  Laterality: N/A;  . LEFT HEART CATH AND CORONARY ANGIOGRAPHY N/A 05/16/2019   Procedure: LEFT HEART CATH AND CORONARY ANGIOGRAPHY;  Surgeon: Isaias Cowman, MD;  Location: [place] [facility];  Service: Cardiovascular;  Laterality: N/A;  . TONSILLECTOMY    . TONSILLECTOMY    . TOTAL VAGINAL HYSTERECTOMY  2002   due to uterine prolapse  . TUBAL LIGATION      Family History  Problem Relation Age of Onset  . Raynaud syndrome Mother   . Arthritis Mother   . Depression Mother   . Anxiety disorder Mother   . Early death Father        age 38? reason  . Depression Daughter   . Hypertension Son   . Arthritis Maternal Grandmother   . Hypertension Maternal Grandmother   . Miscarriages / Stillbirths Maternal Grandmother   . Depression Maternal Grandfather   . Drug abuse Maternal Grandfather   . Early death Maternal Grandfather   . Heart disease Maternal Grandfather        tachycardia   . Arthritis Paternal Grandmother   . Hypertension Paternal Grandmother   . Early death Paternal Grandfather   . Prostate cancer Maternal Uncle 20  . Prostate cancer Cousin   . Breast cancer Neg Hx     Social History:  reports that she quit smoking about 6 years ago.  She has never used smokeless tobacco. She reports current alcohol use of about 7.0 standard drinks of alcohol per week. She reports that she does not use drugs.  Allergies  Allergen Reactions  . Klonopin [Clonazepam]     Did not tolerate     Medications:  I have reviewed the patient's current medications. Prior to Admission:  Medications Prior to Admission  Medication Sig Dispense Refill Last Dose  . ALPRAZolam (XANAX) 1 MG tablet taking 0.5 mg bid and 1 mg qhs prn 60 tablet 5 Unknown at Unknown  . fluticasone (FLONASE) 50 MCG/ACT nasal spray 1 or 2 sprays each nostril twice a day 16 g 11 Unknown at Unknown  .  furosemide (LASIX) 40 MG tablet Take 1 tablet (40 mg total) by mouth daily as needed. 90 tablet 3 prn at prn  . olmesartan-hydrochlorothiazide (BENICAR HCT) 20-12.5 MG tablet Take 1 tablet by mouth daily. In am 90 tablet 3 Unknown at Unknown  . omeprazole (PRILOSEC) 40 MG capsule Take 1 capsule (40 mg total) by mouth daily. 90 capsule 3 Unknown at Unknown  . venlafaxine XR (EFFEXOR-XR) 150 MG 24 hr capsule Take 1 capsule (150 mg total) by mouth daily with breakfast. 90 capsule 3 Unknown at Unknown  . albuterol (PROVENTIL HFA;VENTOLIN HFA) 108 (90 Base) MCG/ACT inhaler Inhale 2 puffs into the lungs every 6 (six) hours as needed for wheezing or shortness of breath. (Patient not taking: Reported on 05/07/2019) 1 Inhaler 12 Not Taking at Unknown time  . albuterol (PROVENTIL) (2.5 MG/3ML) 0.083% nebulizer solution Take 3 mLs (2.5 mg total) by nebulization every 6 (six) hours as needed for wheezing or shortness of breath. (Patient not taking: Reported on 05/07/2019) 75 mL 12 Not Taking at Unknown time  . b complex vitamins tablet Take 1 tablet by mouth daily.   Not Taking at Unknown time   Scheduled: . artificial tears  1 application Both Eyes U9N  . chlorhexidine gluconate (MEDLINE KIT)  15 mL Mouth Rinse BID  . Chlorhexidine Gluconate Cloth  6 each Topical Daily  . enoxaparin (LOVENOX) injection  40 mg Subcutaneous Q12H  . insulin aspart  0-15 Units Subcutaneous Q4H  . mouth rinse  15 mL Mouth Rinse 10 times per day  . sodium chloride flush  3 mL Intravenous Q12H    ROS: Unable to provide due to intubation and sedation  Physical Examination: Blood pressure (!) 62/36, pulse 63, temperature (!) 95.4 F (35.2 C), temperature source Bladder, resp. rate 20, height _0  (1.676 m), weight 121.1 kg, SpO2 97 %.  HEENT-  Normocephalic, no lesions, without obvious abnormality.  Normal external eye and conjunctiva.  Normal TM's bilaterally.  Normal auditory canals and external ears. Normal external nose,  mucus membranes and septum.  Normal pharynx. Cardiovascular- S1, S2 normal, pulses palpable throughout   Lungs- chest clear, no wheezing, rales, normal symmetric air entry Abdomen- soft, non-tender; bowel sounds normal; no masses,  no organomegaly Extremities- mild BLE edema Lymph-no adenopathy palpable Musculoskeletal-no joint tenderness, deformity or swelling Skin-warm and dry, no hyperpigmentation, vitiligo, or suspicious lesions  Neurological Examination   Mental Status: Patient does not respond to verbal stimuli.  Does not respond to deep sternal rub.  Does not follow commands.  No verbalizations noted.  Cranial Nerves: II: patient does not respond confrontation bilaterally, pupils right 3 mm, left 3 mm,and reactive on the left.  No reactivity noted on the right III,IV,VI: Oculocephalic response absent bilaterally.  V,VII: corneal reflex absent bilaterally  VIII: patient  does not respond to verbal stimuli IX,X: gag reflex reduced, XI: trapezius strength unable to test bilaterally XII: tongue strength unable to test Motor: Extremities flaccid throughout.  No spontaneous movement noted.  No purposeful movements noted. Sensory: Does not respond to noxious stimuli in any extremity. Deep Tendon Reflexes:  Absent throughout. Plantars: Absent  bilaterally Cerebellar: Unable to perform   Laboratory Studies:   Basic Metabolic Panel: Recent Labs  Lab 06/08/19 0404 06/08/19 1122 06/09/19 0358 06/09/19 0358 06/09/19 0600 06/09/19 0600 06/09/19 0800 06/09/19 0914 06/09/19 1200  NA 136   < > 134*  --  133*  --  132* 132* 132*  K 2.3*   < > 2.9*  --  2.9*  --  3.0* 3.5 3.5  CL 97*   < > 100  --  100  --  98 99 100  CO2 22   < > 24  --  23  --  23 23 21*  GLUCOSE 178*   < > 140*  --  138*  --  140* 152* 150*  BUN 19   < > 13  --  12  --  _0 CREATININE 0.76   < > 0.51  --  0.43*  --  0.56 0.53 0.46  CALCIUM 7.6*   < > 7.2*   < > 7.5*   < > 7.3* 7.3* 6.9*  MG 2.5*  --   1.5*  --   --   --   --   --   --   PHOS 2.8  --  2.1*  --   --   --   --   --   --    < > = values in this interval not displayed.    Liver Function Tests: Recent Labs  Lab 05/19/2019 2125 06/09/19 0358 06/09/19 0600  AST 292* 139* 139*  ALT 241* 195* 198*  ALKPHOS 134* 93 93  BILITOT 0.9 1.1 1.3*  PROT 8.2* 5.6* 6.0*  ALBUMIN 4.5 2.9* 3.2*   No results for input(s): LIPASE, AMYLASE in the last 168 hours. No results for input(s): AMMONIA in the last 168 hours.  CBC: Recent Labs  Lab 06/01/2019 2125 06/08/19 0404 06/09/19 0600  WBC 26.1* 18.1* 13.6*  NEUTROABS 16.0*  --  11.8*  HGB 13.9 13.7 13.6  HCT 39.9 37.3 37.6  MCV 90.9 87.4 87.4  PLT 377 316 238    Cardiac Enzymes: No results for input(s): CKTOTAL, CKMB, CKMBINDEX, TROPONINI in the last 168 hours.  BNP: Invalid input(s): POCBNP  CBG: Recent Labs  Lab 06/08/19 2338 06/09/19 0349 06/09/19 0605 06/09/19 0811 06/09/19 1159  GLUCAP 166* 136* 125* 134* 159*    Microbiology: Results for orders placed or performed during the hospital encounter of 06/06/2019  SARS Coronavirus 2 by RT PCR (hospital order, performed in [facility] hospital lab) Nasopharyngeal Nasopharyngeal Swab     Status: None   Collection Time: 06/09/2019  9:16 PM   Specimen: Nasopharyngeal Swab  Result Value Ref Range Status   SARS Coronavirus 2 NEGATIVE NEGATIVE Final    Comment: (NOTE) SARS-CoV-2 target nucleic acids are NOT DETECTED. The SARS-CoV-2 RNA is generally detectable in upper and lower respiratory specimens during the acute phase of infection. The lowest concentration of SARS-CoV-2 viral copies this assay can detect is 250 copies / mL. A negative result does not preclude SARS-CoV-2 infection and should not be used as the sole basis for treatment or other patient management decisions.  A negative result  may occur with improper specimen collection / handling, submission of specimen other than nasopharyngeal swab, presence of viral  mutation(s) within the areas targeted by this assay, and inadequate number of viral copies (<250 copies / mL). A negative result must be combined with clinical observations, patient history, and epidemiological information. Fact Sheet for Patients:   StrictlyIdeas.no Fact Sheet for Healthcare Providers: BankingDealers.co.za This test is not yet approved or cleared  by the [place] FDA and has been authorized for detection and/or diagnosis of SARS-CoV-2 by FDA under an Emergency Use Authorization (EUA).  This EUA will remain in effect (meaning this test can be used) for the duration of the COVID-19 declaration under Section 564(b)(1) of the Act, 21 U.S.C. section 360bbb-3(b)(1), unless the authorization is terminated or revoked sooner. Performed at [facility], [address] [place], [place] [address]   MRSA PCR Screening     Status: None   Collection Time: 06/08/19  3:15 AM   Specimen: Urine, Catheterized; Nasopharyngeal  Result Value Ref Range Status   MRSA by PCR NEGATIVE NEGATIVE Final    Comment:        The GeneXpert MRSA Assay (FDA approved for NASAL specimens only), is one component of a comprehensive MRSA colonization surveillance program. It is not intended to diagnose MRSA infection nor to guide or monitor treatment for MRSA infections. Performed at [facility], [address] [place], [place] [address]     Coagulation Studies: Recent Labs    06/03/2019 Mar 10, 2204 06/08/19 0929 06/08/19 1416  LABPROT 13.1 13.7 14.1  INR 1.0 1.1 1.1    Urinalysis: No results for input(s): COLORURINE, LABSPEC, PHURINE, GLUCOSEU, HGBUR, BILIRUBINUR, KETONESUR, PROTEINUR, UROBILINOGEN, NITRITE, LEUKOCYTESUR in the last 168 hours.  Invalid input(s): APPERANCEUR  Lipid Panel:     Component Value Date/Time   CHOL 193 06/08/2019 0404   CHOL 183 09/18/2018 0935   TRIG 397 (H) 06/08/2019 0404   HDL 80  06/08/2019 0404   HDL 82 09/18/2018 0935   CHOLHDL 2.4 06/08/2019 0404   VLDL 79 (H) 06/08/2019 0404   LDLCALC 34 06/08/2019 0404   LDLCALC 88 09/18/2018 0935    HgbA1C:  Lab Results  Component Value Date   HGBA1C 5.4 06/08/2019    Urine Drug Screen:      Component Value Date/Time   LABOPIA NONE DETECTED 06/14/2019 March 10, 2114   COCAINSCRNUR NONE DETECTED 06/12/2019 03/10/2114   LABBENZ POSITIVE (A) 06/04/2019 03-10-2114   AMPHETMU NONE DETECTED 05/23/2019 03/10/14   THCU NONE DETECTED 06/04/2019 2114-03-10   LABBARB NONE DETECTED 05/29/2019 Mar 10, 2114    Alcohol Level: No results for input(s): ETH in the last 168 hours.  Other results: EKG: normal sinus rhythm at 68 bpm, LBBB.  Imaging: EEG  Result Date: 06/09/2019 Alexis Goodell, MD     06/09/2019  1:13 PM ELECTROENCEPHALOGRAM REPORT Patient: Meghan Young       Room #: IC17A-AA EEG No. ID: [id number] Age: 59 y.o.        Sex: female Requesting Physician: Kasa Report Date:  06/09/2019       Interpreting Physician: Alexis Goodell History: Meghan Young is an 59 y.o. female s/p arrest Medications: Nimbex, Fentanyl, Propofol, Levophed, Insulin Conditions of Recording:  This is a 21 channel routine scalp EEG performed with bipolar and monopolar montages arranged in accordance to the international 10/20 system of electrode placement. One channel was dedicated to EKG recording. The patient is in the intubated and sedated state. Description:  The background activity is discontinuous. It consists  of generalized bursts of polyspike, spike and slow wave activity alternating with periods of attenuation. The periods of attenuation are short and generalized as well, lasing up to 1-2 seconds.  The burst activity is often of higher voltage and consisting of more sharp activity over the left parietal region.  For short bursts, at times, this activity is rhythmic.  Hyperventilation and intermittent photic stimulation were not performed IMPRESSION: This is an abnormal EEG due to a  burst-suppression pattern seen throughout the tracing with some rhythmicity noted in the left parietal region.  Burst suppression pattern can be seen in a variety of circumstances, including anesthesia, drug intoxication, hypothermia, as well as cerebral anoxia.  Clinical/neurological, and radiographic correlation advised.  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  Addendum: Results discussed with Dr. Tilman Neat, MD Neurology [phone] 06/09/2019, 1:06 PM   CT Head Wo Contrast  Result Date: 06/08/2019 CLINICAL DATA:  Post cardiac arrest patient unresponsive EXAM: CT HEAD WITHOUT CONTRAST TECHNIQUE: Contiguous axial images were obtained from the base of the skull through the vertex without intravenous contrast. COMPARISON:  None. FINDINGS: Brain: No evidence of acute infarction, hemorrhage, hydrocephalus, extra-axial collection or mass lesion/mass effect. Symmetric prominence of the ventricles, cisterns and sulci compatible with frontal lobe predominant parenchymal volume loss. Patchy areas of white matter hypoattenuation are most compatible with chronic microvascular angiopathy. Vascular: Minimal plaque in the carotid siphons. No abnormal hyperdense vessel. Skull: No calvarial fracture or suspicious osseous lesion. No scalp swelling or hematoma. Sinuses/Orbits: Minimal thickening in the paranasal sinuses, predominantly the ethmoids may be secondary to recent instrumentation. No layering air-fluid levels. Orbital structures are unremarkable aside from prior lens extractions. Other: None IMPRESSION: 1. No acute intracranial findings. 2. Mild frontal lobe predominant parenchymal volume loss and chronic microvascular angiopathy. Electronically Signed   By: Lovena Le M.D.   On: 06/08/2019 00:00   CT Angio Chest PE W/Cm &/Or Wo Cm  Result Date: 05/23/2019 CLINICAL DATA:  Shortness of breath. COPD. Cardiac arrest. EXAM:  CT ANGIOGRAPHY CHEST WITH CONTRAST TECHNIQUE: Multidetector CT imaging of the chest was performed using the standard protocol during bolus administration of intravenous contrast. Multiplanar CT image reconstructions and MIPs were obtained to evaluate the vascular anatomy. CONTRAST:  63m OMNIPAQUE IOHEXOL 350 MG/ML SOLN COMPARISON:  08/31/2010 FINDINGS: Cardiovascular: Evaluation for pulmonary emboli is limited by respiratory motion artifact and streak artifact from the patient's arms.Given these significant limitations, no acute pulmonary embolism was detected. The main pulmonary artery is within normal limits for size. There is no CT evidence of acute right heart strain. The visualized aorta is normal. Heart size is enlarged. There is no significant pericardial effusion. Mediastinum/Nodes: --No mediastinal or hilar lymphadenopathy. --No axillary lymphadenopathy. --No supraclavicular lymphadenopathy. --Normal thyroid gland. --The esophagus is unremarkable Lungs/Pleura: The endotracheal tube terminates just above the carina. The tube is pointed towards the right mainstem bronchus. Lung volumes are low. There is significant bibasilar atelectasis. There is some mild interlobular septal thickening. There is no pneumothorax. There are trace bilateral pleural effusions. Upper Abdomen: The enteric tube extends into the stomach. The tip is not visualized on this study. There is hepatic steatosis. Musculoskeletal: Multiple nondisplaced buckle fractures are noted of the anterior ribs bilaterally. There is no evidence for a fracture of the sternum. Review of the MIP images confirms the above findings. IMPRESSION: 1. Evaluation for pulmonary emboli is limited by respiratory motion artifact and streak artifact from the patient's arms.  Given these significant limitations, no acute pulmonary embolism was detected. 2. The endotracheal tube terminates just above the carina. The tube is pointed towards the right mainstem bronchus.  Repositioning should be considered. 3. Low lung volumes with significant bibasilar atelectasis. 4. Trace bilateral pleural effusions. 5. Cardiomegaly with mild interlobular septal thickening, suggestive of interstitial pulmonary edema. 6. Hepatic steatosis. 7. Multiple nondisplaced buckle fractures of the anterior ribs bilaterally. No evidence for a fracture of the sternum. No pneumothorax. These results will be called to the ordering clinician or representative by the Radiologist Assistant, and communication documented in the PACS or [employer]. Electronically Signed   By: Constance Holster M.D.   On: 05/26/2019 23:59   CARDIAC CATHETERIZATION  Result Date: 06/08/2019 1.  Cardiac arrest 2.  Normal coronary anatomy 3.  Mild to moderate reduced left ventricular function Recommendations 1.  Admit to ICU 2.  Continue mechanical ventilation 3.  2D echocardiogram  DG Chest Port 1 View  Result Date: 06/08/2019 CLINICAL DATA:  Endotracheal tube repositioning. Central line placement. EXAM: PORTABLE CHEST 1 VIEW COMPARISON:  Jun 07, 2019 FINDINGS: The endotracheal tube terminates approximately 3 cm above the carina. There is a new left-sided central venous catheter with tip projecting over the SVC. The heart size remains enlarged. There is no pneumothorax. Low lung volumes with bibasilar atelectasis are noted. IMPRESSION: 1. Lines and tubes as above.  No pneumothorax. 2. Otherwise, no significant short interval change. Electronically Signed   By: Constance Holster M.D.   On: 06/08/2019 01:39   DG Chest Portable 1 View  Result Date: 06/04/2019 CLINICAL DATA:  Cardiac arrest. EXAM: PORTABLE CHEST 1 VIEW COMPARISON:  September 28, 2014 FINDINGS: An endotracheal tube is seen with its distal tip approximately 2.1 cm from the carina. A nasogastric tube is noted with its distal end seen within the body of the stomach. Mild to moderate severity bilateral suprahilar and right infrahilar areas of atelectasis and/or  infiltrate are noted. There is no evidence of a pleural effusion or pneumothorax. There is moderate to marked severity enlargement of the cardiac silhouette. Degenerative changes seen within the thoracic spine. IMPRESSION: 1. Endotracheal tube and nasogastric tube in satisfactory position. 2. Moderate to marked severity bilateral atelectasis and/or infiltrate. 3. Moderate to marked severity cardiomegaly. Electronically Signed   By: Virgina Norfolk M.D.   On: 05/28/2019 22:01   EEG adult  Result Date: 06/08/2019 Alexis Goodell, MD     06/08/2019  2:12 PM ELECTROENCEPHALOGRAM REPORT Patient: Meghan Young       Room #: IC17A-AA EEG No. ID: [id number] Age: 59 y.o.        Sex: female Requesting Physician: Kasa Report Date:  06/08/2019       Interpreting Physician: Alexis Goodell History: Meghan Vazguez is an 59 y.o. female s/p arrest Medications: Nimbex, Fentanyl, Propofol, Levophed, Insulin Conditions of Recording:  This is a 21 channel routine scalp EEG performed with bipolar and monopolar montages arranged in accordance to the international 10/20 system of electrode placement. One channel was dedicated to EKG recording. The patient is in the intubated and sedated state. Description:  The background activity is low voltage, slow and poorly organized.  There are periods of symmetrical, fairly well organized beta activity that are in a distribution that suggests symmetrical sleep spindles.  At times though this activity is only seen over the left parietal region and takes on the appearance of a sharp wave.in that focal region.  There is no evidence of electrographic seizures. Hyperventilation and  intermittent photic stimulation were not performed IMPRESSION: This electroencephalogram is characterized by activity that is suggestive of sleep with some focal activity in the left parietal region that may suggest an irritable focus in that region.  Clinical/neurological, and radiographic correlation advised.  There is  no evidence of electrographic seizures. Alexis Goodell, MD Neurology [phone] 06/08/2019, 2:05 PM   ECHOCARDIOGRAM COMPLETE  Result Date: 06/09/2019    ECHOCARDIOGRAM REPORT   Patient Name:   Meghan Young Date of Exam: 06/08/2019 Medical Rec #:  [id number]      Height:       66.0 in Accession #:    [id number]     Weight:       267.0 lb Date of Birth:  [birth year]      BSA:          2.261 m Patient Age:    89 years       BP:           104/62 mmHg Patient Gender: F              HR:           65 bpm. Exam Location:  [facility] Procedure: 2D Echo, Color Doppler and Cardiac Doppler Indications:     I46.9 Cardiac arrest  History:         Patient has no prior history of Echocardiogram examinations.                  Risk Factors:Hypertension. No medical history.  Sonographer:     Charmayne Sheer RDCS (AE) Referring Phys:  [place] Diagnosing Phys: Yolonda Kida MD  Sonographer Comments: Suboptimal subcostal window. IMPRESSIONS  1. Left ventricular ejection fraction, by estimation, is 30 to 35%. The left ventricle has moderate to severely decreased function. The left ventricle demonstrates global hypokinesis. The left ventricular internal cavity size was moderately dilated. Left ventricular diastolic parameters are consistent with Grade I diastolic dysfunction (impaired relaxation).  2. Right ventricular systolic function is mildly reduced. The right ventricular size is mildly enlarged.  3. Left atrial size was mildly dilated.  4. Right atrial size was mildly dilated.  5. The mitral valve is grossly normal. Mild mitral valve regurgitation.  6. Large mass like lesion on TR valve free mobile , possible vegetation?Marland Kitchen The tricuspid valve is myxomatous.  7. The aortic valve is abnormal. Aortic valve regurgitation is not visualized. Conclusion(s)/Recommendation(s): Findings concerning for tricuspid valve vegetation, would recommend a Transesophageal Echocardiogram for clarification. FINDINGS  Left Ventricle: Left  ventricular ejection fraction, by estimation, is 30 to 35%. The left ventricle has moderate to severely decreased function. The left ventricle demonstrates global hypokinesis. The left ventricular internal cavity size was moderately  dilated. There is no left ventricular hypertrophy. Left ventricular diastolic parameters are consistent with Grade I diastolic dysfunction (impaired relaxation). Right Ventricle: The right ventricular size is mildly enlarged. No increase in right ventricular wall thickness. Right ventricular systolic function is mildly reduced. Left Atrium: Left atrial size was mildly dilated. Right Atrium: Right atrial size was mildly dilated. Pericardium: There is no evidence of pericardial effusion. Mitral Valve: The mitral valve is grossly normal. Mild mitral valve regurgitation. MV peak gradient, 4.4 mmHg. The mean mitral valve gradient is 2.0 mmHg. Tricuspid Valve: Large mass like lesion on TR valve free mobile , possible vegetation?Marland Kitchen The tricuspid valve is myxomatous. Tricuspid valve regurgitation is mild. Aortic Valve: The aortic valve is abnormal. Aortic valve regurgitation is not visualized. Aortic valve mean gradient measures  4.0 mmHg. Aortic valve peak gradient measures 7.5 mmHg. Aortic valve area, by VTI measures 2.04 cm. Pulmonic Valve: The pulmonic valve was normal in structure. Pulmonic valve regurgitation is not visualized. Aorta: The aortic root is normal in size and structure. IAS/Shunts: No atrial level shunt detected by color flow Doppler.  LEFT VENTRICLE PLAX 2D LVIDd:         5.12 cm      Diastology LVIDs:         4.20 cm      LV e' lateral:   7.83 cm/s LV PW:         1.04 cm      LV E/e' lateral: 9.3 LV IVS:        0.77 cm      LV e' medial:    5.00 cm/s LVOT diam:     2.00 cm      LV E/e' medial:  14.6 LV SV:         45 LV SV Index:   20 LVOT Area:     3.14 cm  LV Volumes (MOD) LV vol d, MOD A2C: 139.0 ml LV vol d, MOD A4C: 117.0 ml LV vol s, MOD A2C: 100.0 ml LV vol s, MOD  A4C: 84.6 ml LV SV MOD A2C:     39.0 ml LV SV MOD A4C:     117.0 ml LV SV MOD BP:      32.3 ml LEFT ATRIUM             Index       RIGHT ATRIUM          Index LA diam:        2.90 cm 1.28 cm/m  RA Area:     8.85 cm LA Vol (A2C):   37.4 ml 16.54 ml/m RA Volume:   17.00 ml 7.52 ml/m LA Vol (A4C):   16.4 ml 7.25 ml/m LA Biplane Vol: 26.9 ml 11.90 ml/m  AORTIC VALVE                   PULMONIC VALVE AV Area (Vmax):    1.93 cm    PV Vmax:       1.03 m/s AV Area (Vmean):   1.88 cm    PV Vmean:      65.800 cm/s AV Area (VTI):     2.04 cm    PV VTI:        0.189 m AV Vmax:           137.00 cm/s PV Peak grad:  4.2 mmHg AV Vmean:          87.000 cm/s PV Mean grad:  2.0 mmHg AV VTI:            0.222 m AV Peak Grad:      7.5 mmHg AV Mean Grad:      4.0 mmHg LVOT Vmax:         84.20 cm/s LVOT Vmean:        52.200 cm/s LVOT VTI:          0.144 m LVOT/AV VTI ratio: 0.65  AORTA Ao Root diam: 2.90 cm MITRAL VALVE MV Area (PHT): 3.17 cm     SHUNTS MV Peak grad:  4.4 mmHg     Systemic VTI:  0.14 m MV Mean grad:  2.0 mmHg     Systemic Diam: 2.00 cm MV Vmax:       1.05 m/s MV Vmean:  65.8 cm/s MV Decel Time: 239 msec MV E velocity: 72.80 cm/s MV A velocity: 109.00 cm/s MV E/A ratio:  0.67 Dwayne Prince Rome MD Electronically signed by Yolonda Kida MD Signature Date/Time: 06/09/2019/8:39:38 AM    Final      Assessment/Plan: This is a 59 yo female with a PMH of UTI, HTN, Elevated Liver Enzymes, Papilloma of Nose, GERD, Depression, Bilateral Cataracts, Anxiety, and Allergies s/p arrest with down time of at least 20 minutes prior to ROSC and emergent cardiac cath (EF 30-35%).  Patient currently on hyperthermia protocol.  Should be warmed today.  Initial head CT personally reviewed and shows no acute changes.  Initial EEG with left parietal focal cortical irritability.  Repeat EEG today concerning for status epilepticus.  Patient on Propofol with BP having difficulty tolerating higher doses despite the use of  pressors. Although initial head CT was unremarkable, concern is high for some degree of anoxic brain injury.  Prognosis is guarded.  Recommendations: 1. Keppra 20106m now with maintenance of 15062mq 12 hours.  Although continuous EEG monitoring may be helpful, patient is not stable for transfer at this time.   2. Once patient warmed and off paralytics if she remains altered, repeat head CT or MRI of the brain is indicated.   3. Agree with addressing metabolic abnormalities  LeAlexis GoodellMD Neurology [phone]/25/2021, 1:17 PM

## 2019-06-09 NOTE — Consult Note (Signed)
[place] for Electrolyte Monitoring and Replacement   Recent Labs: Potassium (mmol/L)  Date Value  06/09/2019 2.9 (L)   Magnesium (mg/dL)  Date Value  06/09/2019 1.5 (L)   Calcium (mg/dL)  Date Value  06/09/2019 7.2 (L)   Albumin (g/dL)  Date Value  06/09/2019 2.9 (L)  09/18/2018 4.7   Phosphorus (mg/dL)  Date Value  06/09/2019 2.1 (L)   Sodium (mmol/L)  Date Value  06/09/2019 134 (L)  09/18/2018 142   Corrected Ca: 8.1 mg/dL  Assessment: 59 yo female with a PMH of UTI, HTN, Elevated Liver Enzymes, Papilloma of Nose, GERD, Depression, Bilateral Cataracts, Anxiety, and Allergies.  She presented to [facility] ER via EMS from home on 05/23 post cardiac arrest. She has been re-warming since approximately 0400 this am  Goal of Therapy:  Potassium 4.0 - 5.1 mmol/L Magnesium 2.0 - 2.4 mg/dL All Other Electrolytes WNL  Plan:   Potassium continues to trend toward goal with rewarming process: no additional supplementation required  2 grams IV magnesium sulfate this am (magnesium now at goal)  Per protocol q2h BMP until 06/09/19 at 2200 then restart in am  Dallie Piles ,PharmD Clinical Pharmacist 06/09/2019 7:18 AM

## 2019-06-10 ENCOUNTER — Inpatient Hospital Stay: Payer: Managed Care, Other (non HMO)

## 2019-06-10 LAB — BLOOD GAS, ARTERIAL
Acid-base deficit: 11.6 mmol/L — ABNORMAL HIGH (ref 0.0–2.0)
Acid-base deficit: 15.5 mmol/L — ABNORMAL HIGH (ref 0.0–2.0)
Acid-base deficit: 14.1 mmol/L — ABNORMAL HIGH (ref 0.0–2.0)
Acid-base deficit: 3.5 mmol/L — ABNORMAL HIGH (ref 0.0–2.0)
Bicarbonate: 18.4 mmol/L — ABNORMAL LOW (ref 20.0–28.0)
Bicarbonate: 17 mmol/L — ABNORMAL LOW (ref 20.0–28.0)
Bicarbonate: 15.7 mmol/L — ABNORMAL LOW (ref 20.0–28.0)
Bicarbonate: 24.7 mmol/L (ref 20.0–28.0)
FIO2: 0.9
FIO2: 100
FIO2: 100
FIO2: 1
MECHVT: 450 mL
MECHVT: 450 mL
Mechanical Rate: 20
Mechanical Rate: 24
O2 Saturation: 97.5 %
O2 Saturation: 43.9 %
O2 Saturation: 73.1 %
O2 Saturation: 82.3 %
PEEP: 10 cmH2O
PEEP: 13 cmH2O
PEEP: 15 cmH2O
PEEP: 15 cmH2O
Patient temperature: 36.8
Patient temperature: 37
Patient temperature: 37
Patient temperature: 35.4
Pressure control: 19 cmH2O
Pressure control: 19 cmH2O
RATE: 20 resp/min
RATE: 12 resp/min
RATE: 24 resp/min
RATE: 24 resp/min
pCO2 arterial: 57 mmHg — ABNORMAL HIGH (ref 32.0–48.0)
pCO2 arterial: 72 mmHg (ref 32.0–48.0)
pCO2 arterial: 53 mmHg — ABNORMAL HIGH (ref 32.0–48.0)
pCO2 arterial: 55 mmHg — ABNORMAL HIGH (ref 32.0–48.0)
pH, Arterial: 7.11 — CL (ref 7.350–7.450)
pH, Arterial: 6.98 — CL (ref 7.350–7.450)
pH, Arterial: 7.08 — CL (ref 7.350–7.450)
pH, Arterial: 7.25 — ABNORMAL LOW (ref 7.350–7.450)
pO2, Arterial: 124 mmHg — ABNORMAL HIGH (ref 83.0–108.0)
pO2, Arterial: 40 mmHg — CL (ref 83.0–108.0)
pO2, Arterial: 55 mmHg — ABNORMAL LOW (ref 83.0–108.0)
pO2, Arterial: 50 mmHg — ABNORMAL LOW (ref 83.0–108.0)

## 2019-06-10 LAB — GLUCOSE, CAPILLARY
Glucose-Capillary: 102 mg/dL — ABNORMAL HIGH (ref 70–99)
Glucose-Capillary: 105 mg/dL — ABNORMAL HIGH (ref 70–99)
Glucose-Capillary: 124 mg/dL — ABNORMAL HIGH (ref 70–99)
Glucose-Capillary: 132 mg/dL — ABNORMAL HIGH (ref 70–99)
Glucose-Capillary: 48 mg/dL — ABNORMAL LOW (ref 70–99)
Glucose-Capillary: 52 mg/dL — ABNORMAL LOW (ref 70–99)
Glucose-Capillary: 63 mg/dL — ABNORMAL LOW (ref 70–99)
Glucose-Capillary: 64 mg/dL — ABNORMAL LOW (ref 70–99)
Glucose-Capillary: 74 mg/dL (ref 70–99)
Glucose-Capillary: 80 mg/dL (ref 70–99)
Glucose-Capillary: 83 mg/dL (ref 70–99)

## 2019-06-10 LAB — CBC
HCT: 32.3 % — ABNORMAL LOW (ref 36.0–46.0)
Hemoglobin: 11.5 g/dL — ABNORMAL LOW (ref 12.0–15.0)
MCH: 32.4 pg (ref 26.0–34.0)
MCHC: 35.6 g/dL (ref 30.0–36.0)
MCV: 91 fL (ref 80.0–100.0)
Platelets: 306 10*3/uL (ref 150–400)
RBC: 3.55 MIL/uL — ABNORMAL LOW (ref 3.87–5.11)
RDW: 14 % (ref 11.5–15.5)
WBC: 6.4 10*3/uL (ref 4.0–10.5)
nRBC: 0.9 % — ABNORMAL HIGH (ref 0.0–0.2)

## 2019-06-10 LAB — RENAL FUNCTION PANEL
Albumin: 2.7 g/dL — ABNORMAL LOW (ref 3.5–5.0)
Anion gap: 14 (ref 5–15)
BUN: 14 mg/dL (ref 6–20)
CO2: 18 mmol/L — ABNORMAL LOW (ref 22–32)
Calcium: 6.2 mg/dL — CL (ref 8.9–10.3)
Chloride: 98 mmol/L (ref 98–111)
Creatinine, Ser: 1.3 mg/dL — ABNORMAL HIGH (ref 0.44–1.00)
GFR calc Af Amer: 53 mL/min — ABNORMAL LOW (ref >60)
GFR calc non Af Amer: 46 mL/min — ABNORMAL LOW (ref >60)
Glucose, Bld: 111 mg/dL — ABNORMAL HIGH (ref 70–99)
Phosphorus: 5.3 mg/dL — ABNORMAL HIGH (ref 2.5–4.6)
Potassium: 3.8 mmol/L (ref 3.5–5.1)
Sodium: 130 mmol/L — ABNORMAL LOW (ref 135–145)

## 2019-06-10 LAB — BASIC METABOLIC PANEL
Anion gap: 15 (ref 5–15)
BUN: 13 mg/dL (ref 6–20)
CO2: 15 mmol/L — ABNORMAL LOW (ref 22–32)
Calcium: 6.5 mg/dL — ABNORMAL LOW (ref 8.9–10.3)
Chloride: 101 mmol/L (ref 98–111)
Creatinine, Ser: 1.06 mg/dL — ABNORMAL HIGH (ref 0.44–1.00)
GFR calc Af Amer: >60 mL/min (ref >60)
GFR calc non Af Amer: 58 mL/min — ABNORMAL LOW (ref >60)
Glucose, Bld: 111 mg/dL — ABNORMAL HIGH (ref 70–99)
Potassium: 4.5 mmol/L (ref 3.5–5.1)
Sodium: 131 mmol/L — ABNORMAL LOW (ref 135–145)

## 2019-06-10 LAB — MAGNESIUM: Magnesium: 2.7 mg/dL — ABNORMAL HIGH (ref 1.7–2.4)

## 2019-06-10 MED ORDER — SODIUM BICARBONATE-DEXTROSE 150-5 MEQ/L-% IV SOLN
150.0000 meq | INTRAVENOUS | Status: DC
  Administered 2019-06-10 – 2019-06-11 (×2): 150 meq via INTRAVENOUS
  Filled 2019-06-10: qty 1000

## 2019-06-10 MED ORDER — SODIUM BICARBONATE 8.4 % IV SOLN
50.0000 meq | Freq: Once | INTRAVENOUS | Status: AC
  Administered 2019-06-10: 50 meq via INTRAVENOUS

## 2019-06-10 MED ORDER — SODIUM BICARBONATE 8.4 % IV SOLN: 50.0000 meq | Freq: Once | INTRAVENOUS | Status: AC

## 2019-06-10 MED ORDER — SODIUM BICARBONATE 8.4 % IV SOLN
INTRAVENOUS | Status: AC
  Filled 2019-06-10: qty 150

## 2019-06-10 MED ORDER — SODIUM BICARBONATE 8.4 % IV SOLN
INTRAVENOUS | Status: AC
  Administered 2019-06-10: 50 meq via INTRAVENOUS
  Filled 2019-06-10: qty 100

## 2019-06-10 MED ORDER — DEXTROSE 50 % IV SOLN
INTRAVENOUS | Status: AC
  Administered 2019-06-10: 50 mL via INTRAVENOUS
  Filled 2019-06-10: qty 50

## 2019-06-10 MED ORDER — DEXTROSE 50 % IV SOLN
25.0000 mL | Freq: Once | INTRAVENOUS | Status: AC
  Administered 2019-06-10: 25 mL via INTRAVENOUS

## 2019-06-10 MED ORDER — MIDAZOLAM 50MG/50ML (1MG/ML) PREMIX INFUSION: 10.0000 mg/h | INTRAVENOUS | Status: DC

## 2019-06-10 MED ORDER — SODIUM BICARBONATE-DEXTROSE 150-5 MEQ/L-% IV SOLN
150.0000 meq | INTRAVENOUS | Status: DC
  Administered 2019-06-10 (×2): 150 meq via INTRAVENOUS
  Filled 2019-06-10 (×2): qty 1000

## 2019-06-10 MED ORDER — ACETAMINOPHEN 325 MG PO TABS
650.0000 mg | ORAL_TABLET | ORAL | Status: DC | PRN
  Administered 2019-06-10: 650 mg via ORAL
  Filled 2019-06-10: qty 2

## 2019-06-10 MED ORDER — IPRATROPIUM-ALBUTEROL 0.5-2.5 (3) MG/3ML IN SOLN: 3.0000 mL | RESPIRATORY_TRACT | Status: DC | PRN

## 2019-06-10 MED ORDER — DEXTROSE 50 % IV SOLN: 1.0000 | Freq: Once | INTRAVENOUS | Status: AC

## 2019-06-10 MED ORDER — HYDROCORTISONE NA SUCCINATE PF 100 MG IJ SOLR
50.0000 mg | Freq: Four times a day (QID) | INTRAMUSCULAR | Status: DC
  Administered 2019-06-10 – 2019-06-11 (×3): 50 mg via INTRAVENOUS
  Filled 2019-06-10 (×3): qty 2

## 2019-06-10 MED ORDER — ACETAMINOPHEN 160 MG/5ML PO SOLN
650.0000 mg | ORAL | Status: DC | PRN
  Filled 2019-06-10: qty 20.3

## 2019-06-10 MED ORDER — ACETAMINOPHEN 650 MG RE SUPP: 650.0000 mg | RECTAL | Status: DC | PRN

## 2019-06-10 MED ORDER — SODIUM BICARBONATE 8.4 % IV SOLN
50.0000 meq | Freq: Once | INTRAVENOUS | Status: AC
  Administered 2019-06-10: 50 meq via INTRAVENOUS
  Filled 2019-06-10: qty 50

## 2019-06-10 MED ORDER — DEXTROSE 50 % IV SOLN
INTRAVENOUS | Status: AC
  Administered 2019-06-10: 50 mL
  Filled 2019-06-10: qty 50

## 2019-06-10 MED ORDER — SODIUM BICARBONATE 8.4 % IV SOLN
INTRAVENOUS | Status: AC
  Administered 2019-06-10: 50 meq via INTRAVENOUS
  Filled 2019-06-10: qty 50

## 2019-06-10 MED ORDER — BUDESONIDE 0.5 MG/2ML IN SUSP
0.5000 mg | Freq: Two times a day (BID) | RESPIRATORY_TRACT | Status: DC
  Administered 2019-06-10 – 2019-06-11 (×2): 0.5 mg via RESPIRATORY_TRACT
  Filled 2019-06-10 (×2): qty 2

## 2019-06-10 MED ORDER — PHENYLEPHRINE CONCENTRATED 100MG/250ML (0.4 MG/ML) INFUSION SIMPLE
0.0000 ug/min | INTRAVENOUS | Status: DC
  Administered 2019-06-10 (×3): 400 ug/min via INTRAVENOUS
  Administered 2019-06-10: 55 ug/min via INTRAVENOUS
  Administered 2019-06-11: 400 ug/min via INTRAVENOUS
  Administered 2019-06-11: 133.333 ug/min via INTRAVENOUS
  Administered 2019-06-11: 400 ug/min via INTRAVENOUS
  Filled 2019-06-10 (×10): qty 250

## 2019-06-10 MED ORDER — VECURONIUM BROMIDE 10 MG IV SOLR
10.0000 mg | INTRAVENOUS | Status: DC | PRN
  Administered 2019-06-10: 10 mg via INTRAVENOUS
  Filled 2019-06-10: qty 10

## 2019-06-10 MED ORDER — STERILE WATER FOR INJECTION IV SOLN
INTRAVENOUS | Status: DC
  Filled 2019-06-10: qty 850

## 2019-06-10 NOTE — Progress Notes (Signed)
ETT advanced to 24 per md based on CXR results

## 2019-06-10 NOTE — Progress Notes (Signed)
Subjective: Patient off sedation but remains unresponsive.    Objective: Current vital signs: BP (!) 73/55   Pulse 72   Temp (!) 97.5 F (36.4 C)   Resp (!) 24   Ht '5\' 6"'  (1.676 m)   Wt 121.1 kg   SpO2 (!) 84%   BMI 43.09 kg/m  Vital signs in last 24 hours: Temp:  [95.2 F (35.1 C)-99.3 F (37.4 C)] 97.5 F (36.4 C) (05/26 0700) Pulse Rate:  [63-76] 72 (05/26 0630) Resp:  [8-25] 24 (05/26 0700) BP: (52-96)/(31-67) 73/55 (05/25 2130) SpO2:  [84 %-100 %] 84 % (05/26 0700) Arterial Line BP: (84-160)/(45-70) 117/47 (05/26 0700) FiO2 (%):  [50 %-100 %] 100 % (05/26 0700)  Intake/Output from previous day: 05/25 0701 - 05/26 0700 In: 6181.8 [I.V.:4531.6; IV Piggyback:1650.2] Out: 470 [Urine:450; Emesis/NG output:20] Intake/Output this shift: No intake/output data recorded. Nutritional status:  Diet Order    None      Neurologic Exam: Mental Status: Patient does not respond to verbal stimuli.  Does not respond to deep sternal rub.  Does not follow commands.  No verbalizations noted.  Cranial Nerves: II: patient does not respond confrontation bilaterally, pupils right 4 mm, left 4 mm,and unreactive bilaterally III,IV,VI: Oculocephalic response absent bilaterally.  V,VII: corneal reflex absent bilaterally  VIII: patient does not respond to verbal stimuli IX,X: gag reflex reduced, XI: trapezius strength unable to test bilaterally XII: tongue strength unable to test Motor: Extremities flaccid throughout.  No spontaneous movement noted.  No purposeful movements noted. Sensory: Does not respond to noxious stimuli in any extremity.   Lab Results: Basic Metabolic Panel: Recent Labs  Lab 06/08/19 0404 06/08/19 1122 06/09/19 0358 06/09/19 0600 06/09/19 1400 06/09/19 1400 06/09/19 1600 06/09/19 1600 06/09/19 2000 06/09/19 2000 06/09/19 2200 06/10/19 0017 06/10/19 0546  NA 136   < > 134*   < > 133*   < > 131*  --  132*  --  131* 131* 130*  K 2.3*   < > 2.9*   < >  3.6   < > 4.3  --  5.2*  --  5.1 4.5 3.8  CL 97*   < > 100   < > 101   < > 102  --  101  --  102 101 98  CO2 22   < > 24   < > 20*   < > 20*  --  18*  --  18* 15* 18*  GLUCOSE 178*   < > 140*   < > 149*   < > 153*  --  134*  --  127* 111* 111*  BUN 19   < > 13   < > 12   < > 12  --  13  --  '14 13 14  ' CREATININE 0.76   < > 0.51   < > 0.60   < > 0.61  --  0.92  --  0.96 1.06* 1.30*  CALCIUM 7.6*   < > 7.2*   < > 6.6*   < > 6.7*   < > 6.6*   < > 6.5* 6.5* 6.2*  MG 2.5*  --  1.5*  --  2.2  --   --   --   --   --   --   --  2.7*  PHOS 2.8  --  2.1*  --  3.0  --   --   --   --   --   --   --  5.3*   < > = values in this interval not displayed.    Liver Function Tests: Recent Labs  Lab 06/15/2019 2125 06/09/19 0358 06/09/19 0600 06/10/19 0546  AST 292* 139* 139*  --   ALT 241* 195* 198*  --   ALKPHOS 134* 93 93  --   BILITOT 0.9 1.1 1.3*  --   PROT 8.2* 5.6* 6.0*  --   ALBUMIN 4.5 2.9* 3.2* 2.7*   No results for input(s): LIPASE, AMYLASE in the last 168 hours. No results for input(s): AMMONIA in the last 168 hours.  CBC: Recent Labs  Lab 05/27/2019 2125 06/08/19 0404 06/09/19 0600 06/10/19 0546  WBC 26.1* 18.1* 13.6* 6.4  NEUTROABS 16.0*  --  11.8*  --   HGB 13.9 13.7 13.6 11.5*  HCT 39.9 37.3 37.6 32.3*  MCV 90.9 87.4 87.4 91.0  PLT 377 316 238 306    Cardiac Enzymes: No results for input(s): CKTOTAL, CKMB, CKMBINDEX, TROPONINI in the last 168 hours.  Lipid Panel: Recent Labs  Lab 06/04/2019 2125 06/08/19 0404  CHOL  --  193  TRIG 147 397*  HDL  --  80  CHOLHDL  --  2.4  VLDL  --  79*  LDLCALC  --  34    CBG: Recent Labs  Lab 06/09/19 1606 06/09/19 2011 06/10/19 0012 06/10/19 0503 06/10/19 0729  GLUCAP 152* 132* 105* 80 124*    Microbiology: Results for orders placed or performed during the hospital encounter of 06/02/2019  SARS Coronavirus 2 by RT PCR (hospital order, performed in [facility] hospital lab) Nasopharyngeal Nasopharyngeal Swab     Status: None    Collection Time: 06/04/2019  9:16 PM   Specimen: Nasopharyngeal Swab  Result Value Ref Range Status   SARS Coronavirus 2 NEGATIVE NEGATIVE Final    Comment: (NOTE) SARS-CoV-2 target nucleic acids are NOT DETECTED. The SARS-CoV-2 RNA is generally detectable in upper and lower respiratory specimens during the acute phase of infection. The lowest concentration of SARS-CoV-2 viral copies this assay can detect is 250 copies / mL. A negative result does not preclude SARS-CoV-2 infection and should not be used as the sole basis for treatment or other patient management decisions.  A negative result may occur with improper specimen collection / handling, submission of specimen other than nasopharyngeal swab, presence of viral mutation(s) within the areas targeted by this assay, and inadequate number of viral copies (<250 copies / mL). A negative result must be combined with clinical observations, patient history, and epidemiological information. Fact Sheet for Patients:   StrictlyIdeas.no Fact Sheet for Healthcare Providers: BankingDealers.co.za This test is not yet approved or cleared  by the [place] FDA and has been authorized for detection and/or diagnosis of SARS-CoV-2 by FDA under an Emergency Use Authorization (EUA).  This EUA will remain in effect (meaning this test can be used) for the duration of the COVID-19 declaration under Section 564(b)(1) of the Act, 21 U.S.C. section 360bbb-3(b)(1), unless the authorization is terminated or revoked sooner. Performed at [facility], [address] [place], [place] [address]   MRSA PCR Screening     Status: None   Collection Time: 06/08/19  3:15 AM   Specimen: Urine, Catheterized; Nasopharyngeal  Result Value Ref Range Status   MRSA by PCR NEGATIVE NEGATIVE Final    Comment:        The GeneXpert MRSA Assay (FDA approved for NASAL specimens only), is one component of  a comprehensive MRSA colonization surveillance program. It  is not intended to diagnose MRSA infection nor to guide or monitor treatment for MRSA infections. Performed at [facility], [address] [place], [place] [address]     Coagulation Studies: Recent Labs    06/14/2019 Mar 26, 2204 06/08/19 0929 06/08/19 1416  LABPROT 13.1 13.7 14.1  INR 1.0 1.1 1.1    Imaging: EEG  Result Date: 06/09/2019 Alexis Goodell, MD     06/09/2019  1:13 PM ELECTROENCEPHALOGRAM REPORT Patient: Delorise Shiner       Room #: IC17A-AA EEG No. ID: [id number] Age: 59 y.o.        Sex: female Requesting Physician: Kasa Report Date:  06/09/2019       Interpreting Physician: Alexis Goodell History: Kaithlyn Teagle is an 59 y.o. female s/p arrest Medications: Nimbex, Fentanyl, Propofol, Levophed, Insulin Conditions of Recording:  This is a 21 channel routine scalp EEG performed with bipolar and monopolar montages arranged in accordance to the international 10/20 system of electrode placement. One channel was dedicated to EKG recording. The patient is in the intubated and sedated state. Description:  The background activity is discontinuous. It consists of generalized bursts of polyspike, spike and slow wave activity alternating with periods of attenuation. The periods of attenuation are short and generalized as well, lasing up to 1-2 seconds.  The burst activity is often of higher voltage and consisting of more sharp activity over the left parietal region.  For short bursts, at times, this activity is rhythmic.  Hyperventilation and intermittent photic stimulation were not performed IMPRESSION: This is an abnormal EEG due to a burst-suppression pattern seen throughout the tracing with some rhythmicity noted in the left parietal region.  Burst suppression pattern can be seen in a variety of circumstances, including anesthesia, drug intoxication, hypothermia, as well as cerebral anoxia.  Clinical/neurological, and  radiographic correlation advised.  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  Addendum: Results discussed with Dr. Tilman Neat, MD Neurology [phone] 06/09/2019, 1:06 PM   DG Chest Port 1 View  Result Date: 06/10/2019 CLINICAL DATA:  Acute respiratory failure. EXAM: PORTABLE CHEST 1 VIEW COMPARISON:  06/08/2019 FINDINGS: The endotracheal tube has been withdrawn and now terminates nearly 7 cm above the carina. Left jugular catheter terminates over the SVC, unchanged. Enteric tube courses into the abdomen with tip not imaged. The cardiac silhouette remains enlarged. Lung volumes remain low with increasing airspace opacities in the mid and lower lungs bilaterally as well as in the right greater than left upper lungs. There may be small bilateral pleural effusions. No pneumothorax is identified. IMPRESSION: 1. Worsening bilateral airspace opacities which may reflect edema with small pleural effusions. 2. Interval withdrawal of the endotracheal tube, now nearly 7 cm above the carina. Electronically Signed   By: Logan Bores M.D.   On: 06/10/2019 08:18   EEG adult  Result Date: 06/08/2019 Alexis Goodell, MD     06/08/2019  2:12 PM ELECTROENCEPHALOGRAM REPORT Patient: Gabrielly Mccrystal       Room #: IC17A-AA EEG No. ID: [id number] Age: 59 y.o.        Sex: female Requesting Physician: Kasa Report Date:  06/08/2019       Interpreting Physician: Alexis Goodell History: Kayleann Mccaffery is an 59 y.o. female s/p arrest Medications: Nimbex, Fentanyl, Propofol, Levophed, Insulin Conditions of Recording:  This is a 21 channel routine scalp EEG performed with bipolar and monopolar montages arranged in accordance to the international 10/20 system of electrode  placement. One channel was dedicated to EKG recording. The patient is in the intubated and sedated state. Description:  The background activity is low voltage, slow and poorly  organized.  There are periods of symmetrical, fairly well organized beta activity that are in a distribution that suggests symmetrical sleep spindles.  At times though this activity is only seen over the left parietal region and takes on the appearance of a sharp wave.in that focal region.  There is no evidence of electrographic seizures. Hyperventilation and intermittent photic stimulation were not performed IMPRESSION: This electroencephalogram is characterized by activity that is suggestive of sleep with some focal activity in the left parietal region that may suggest an irritable focus in that region.  Clinical/neurological, and radiographic correlation advised.  There is no evidence of electrographic seizures. Alexis Goodell, MD Neurology [phone] 06/08/2019, 2:05 PM    Medications:  I have reviewed the patient's current medications. Scheduled: . artificial tears  1 application Both Eyes B4Y  . chlorhexidine gluconate (MEDLINE KIT)  15 mL Mouth Rinse BID  . Chlorhexidine Gluconate Cloth  6 each Topical Daily  . enoxaparin (LOVENOX) injection  40 mg Subcutaneous Q12H  . insulin aspart  0-15 Units Subcutaneous Q4H  . mouth rinse  15 mL Mouth Rinse 10 times per day  . sodium chloride flush  3 mL Intravenous Q12H    Assessment/Plan: This is a 59 yo female with a PMH of UTI,HTN,Elevated Liver Enzymes,Papilloma of Nose, GERD, Depression, Bilateral Cataracts, Anxiety, and Allergies s/p arrest with down time of at least 20 minutes prior to ROSC and emergent cardiac cath (EF 30-35%).  Patient currently on hyperthermia protocol.  Should be warmed today.  Initial head CT personally reviewed and shows no acute changes.  Initial EEG with left parietal focal cortical irritability.  Repeat EEG yesterday concerning for status epilepticus.  Patient became more unstable on Propofol and now sedation discontinued.  Patient remains unresponsive.  Prognosis extremely poor for functional recovery.  Discussion  had with husband this morning.  Patient has been made a DNR.    Recommendations: 1. Continue Keppra at 1568m q 12 hours 2. Repeat EEG today.   3. Although repeat head CT would be helpful at this juncture patient is too unstable for transfer to scanner.    Case discussed with Dr. KMortimer Fries  LOS: 3 days   LAlexis Goodell MD Neurology [phone]/26/2021  11:11 AM

## 2019-06-10 NOTE — Progress Notes (Signed)
Neuro: Pt off of all sedation for entire shift and  unable to follow commands at this time. Pt unarousable and not responsive to painful stimuli. Pupils sluggish but reactive at this time. EEG done today and neurology following. Pt in post warming phase of code ICE with goal to maintain temp <37.0C.    Respiratory: Pt remains on ventilator at this time with vent settings of PRVC, fio2 at 100% with PEEP increased to 15. O2 sats remain low at times, but difficult to assess sue to poor O2 reading. . Last blood gas results below.   as of 06/10/2019 19:20  Ref. Range 06/10/2019 17:16  Sample type Unknown ARTERIAL DRAW  Delivery systems Unknown VENTILATOR  FIO2 Unknown 100.00  Mode Unknown PRESSURE CONTROL  Peep/cpap Latest Units: cm H20 15.0  Pressure control Latest Units: cm H20 19.0  pH, Arterial Latest Ref Range: 7.350 - 7.450  7.08 (LL)  pCO2 arterial Latest Ref Range: 32.0 - 48.0 mmHg 53 (H)  pO2, Arterial Latest Ref Range: 83.0 - 108.0 mmHg 55 (L)  Acid-base deficit Latest Ref Range: 0.0 - 2.0 mmol/L 14.1 (H)  Bicarbonate Latest Ref Range: 20.0 - 28.0 mmol/L 15.7 (L)  O2 Saturation Latest Units: % 73.1  Patient temperature Unknown 37.0  Collection site Unknown A-LINE     Cardiovascular: Pt remains in sinus rhythm with minimal ectopy at this time. BP continues to be low and pt receiving 3 vasopressors for BP control with Maps maintaining in 50-60's. Pt with generalized edema that has increased throughout shift. Multiple amps of bicarb were given for low pH with some temporary effect to increase BP. Radial pulses palpable, but lower extremity pulses requiring doppler to assess.     GI/GU: Pt with foley in place with low urine output. Pt with 72ml of urine output throughout shift. MD aware of increase in creatine. Pt with OG tube to LIWS. Tube feeds not initiated at this time.    Skin: Skin intact with no s/s of skin breakdown at this time. Mottling and dusky skin noted with cap refill  greater than 3.   Pain: Pt with no signs of pain at this time with no sedation.     Events: Family spoke with MD and discussed code status with a decision to make pt a DNR. Family aware of poor prognosis and accepting to plan of care at this time. Pt likely to code at any time and family requesting to be called with any change of status.

## 2019-06-10 NOTE — Procedures (Signed)
ELECTROENCEPHALOGRAM REPORT   Patient: Karolyn Bragdon       Room #: IC17A-AA EEG No. ID: [id number] Age: 59 y.o.        Sex: female Requesting Physician: Kasa Report Date:  06/10/2019        Interpreting Physician: Alexis Goodell  History: Marciana Mcglashan is an 59 y.o. female s/p arrest  Medications:  Pitressin, Levophed, Insulin, Unasyn, Keppra  Conditions of Recording:  This is a 21 channel routine scalp EEG performed with bipolar and monopolar montages arranged in accordance to the international 10/20 system of electrode placement. One channel was dedicated to EKG recording.  The patient is in the intubated and unresponsive state.  Description:  The background activity is slow and poorly organized.  It consists of low voltage activity in the delta-theta continuum.  There are occasional, intermittent vertex central sharp transients noted as well.  This activity is persistent throughout the recording. The is no evidence of subclinical status epilepticus.  Hyperventilation and intermittent photic stimulation were not performed.  IMPRESSION: This electroencephalogram is significant for general background slowing with intermittent vertex central sharp transients noted.  There is no longer evidence of subclinical status epilepticus.    Alexis Goodell, MD Neurology [phone] 06/10/2019, 2:15 PM

## 2019-06-10 NOTE — Consult Note (Signed)
[place] for Electrolyte Monitoring and Replacement   Recent Labs: Potassium (mmol/L)  Date Value  06/10/2019 4.5   Magnesium (mg/dL)  Date Value  06/09/2019 2.2   Calcium (mg/dL)  Date Value  06/10/2019 6.5 (L)   Albumin (g/dL)  Date Value  06/09/2019 3.2 (L)  09/18/2018 4.7   Phosphorus (mg/dL)  Date Value  06/09/2019 3.0   Sodium (mmol/L)  Date Value  06/10/2019 131 (L)  09/18/2018 142   Corrected Ca: 7.1 mg/dL  Assessment: 59 yo female with a PMH of UTI, HTN, Elevated Liver Enzymes, Papilloma of Nose, GERD, Depression, Bilateral Cataracts, Anxiety, and Allergies.  She presented to [facility] ER via EMS from home on 05/23 post cardiac arrest. She has been re-warming since approximately 0400 this am  Goal of Therapy:  Potassium 4.0 - 5.1 mmol/L Magnesium 2.0 - 2.4 mg/dL All Other Electrolytes WNL  Plan:   Electrolytes WNL: no further replacement required  Next electrolytes in am 5/27  Dallie Piles ,PharmD Clinical Pharmacist 06/10/2019 7:10 AM

## 2019-06-10 NOTE — Progress Notes (Signed)
The Clinical status was relayed to family in detail.  Updated and notified of patients medical condition.  Patient remains unresponsive and will not open eyes to command. Explained to family course of therapy and the modalities     Patient with Progressive multiorgan failure with very low chance of meaningful recovery despite all aggressive and optimal medical therapy.  Patient is in the dying Process associated with suffering.  Family understands the situation.  They have consented and agreed to DNR/DNI   Family are satisfied with Plan of action and management. All questions answered  Additional CC time 32 mins   Naysa Puskas Patricia Pesa, M.D.  [facility]  Medical Director [facility] Director [facility] Cardio-Pulmonary Department

## 2019-06-10 NOTE — Progress Notes (Signed)
eeg completed ° °

## 2019-06-10 NOTE — Progress Notes (Addendum)
CRITICAL CARE NOTE 59 yo WF with s/p cardiac arrest witnessed CPR 20-30 mins On Hypothermia Protocol with multiorgan failure +ETOH USE and low potassium, was out doors possible heat exhaustion  5/23admitted for cardiac arrest 5/24 multiorgan failure, remains on hypothermia protocol Family  Updated, poor prognosis 5/25 ART LINE PLACED, multiorgan failure, hypothermia protocol-rewarming phrase 5/26 rewarming  phase completed   CC  follow up respiratory failure  HPI Patient remains critically ill Prognosis is guarded EF 30% Severe cardiogenic shock Severe acidosis Will stop propofol and start versed   BP (!) 73/55   Pulse 72   Temp (!) 97.5 F (36.4 C)   Resp (!) 24   Ht '5\' 6"'  (1.676 m)   Wt 121.1 kg   SpO2 92%   BMI 43.09 kg/m    I/O last 3 completed shifts: In: 6889 [I.V.:5188.8; IV Piggyback:1700.2] Out: 1320 [Urine:1275; Emesis/NG output:45] No intake/output data recorded.  SpO2: 92 % FiO2 (%): 90 %  Estimated body mass index is 43.09 kg/m as calculated from the following:   Height as of this encounter: '5\' 6"'  (1.676 m).   Weight as of this encounter: 121.1 kg.  SIGNIFICANT EVENTS   REVIEW OF SYSTEMS  PATIENT IS UNABLE TO PROVIDE COMPLETE REVIEW OF SYSTEMS DUE TO SEVERE CRITICAL ILLNESS        PHYSICAL EXAMINATION:  GENERAL:critically ill appearing, +resp distress HEAD: Normocephalic, atraumatic.  EYES: Pupils fixed/dilated.  No scleral icterus. No corneal reflex MOUTH: Moist mucosal membrane. NECK: Supple.  PULMONARY: +rhonchi, +wheezing CARDIOVASCULAR: S1 and S2. Regular rate and rhythm. No murmurs, rubs, or gallops.  GASTROINTESTINAL: Soft, nontender, -distended.  Positive bowel sounds.   MUSCULOSKELETAL: No swelling, clubbing, or edema.  NEUROLOGIC: obtunded, GCS<3 SKIN:intact,warm,dry  MEDICATIONS: I have reviewed all medications and confirmed regimen as documented   CULTURE RESULTS   Recent Results (from the past 240 hour(s))   SARS Coronavirus 2 by RT PCR (hospital order, performed in [facility] hospital lab) Nasopharyngeal Nasopharyngeal Swab     Status: None   Collection Time: 06/13/2019  9:16 PM   Specimen: Nasopharyngeal Swab  Result Value Ref Range Status   SARS Coronavirus 2 NEGATIVE NEGATIVE Final    Comment: (NOTE) SARS-CoV-2 target nucleic acids are NOT DETECTED. The SARS-CoV-2 RNA is generally detectable in upper and lower respiratory specimens during the acute phase of infection. The lowest concentration of SARS-CoV-2 viral copies this assay can detect is 250 copies / mL. [facility] negative result does not preclude SARS-CoV-2 infection and should not be used as the sole basis for treatment or other patient management decisions.  M[facility] negative result may occur with improper specimen collection / handling, submission of specimen other than nasopharyngeal swab, presence of viral mutation(s) within the areas targeted by this assay, and inadequate number of viral copies (<250 copies / mL). Meghan negative result must be combined with clinical observations, patient history, and epidemiological information. Fact Sheet for Patients:   StrictlyIdeas.no Fact Sheet for Healthcare Providers: BankingDealers.co.za This test is not yet approved or cleared  by the [place] FDA and has been authorized for detection and/or diagnosis of SARS-CoV-2 by FDA under an Emergency Use Authorization (EUA).  This EUA will remain in effect (meaning this test can be used) for the duration of the COVID-19 declaration under Section 564(b)(1) of the Act, 21 U.S.C. section 360bbb-3(b)(1), unless the authorization is terminated or revoked sooner. Performed at [facility], [address] [place], [place] [address]   MRSA PCR Screening  Status: None   Collection Time: 06/08/19  3:15 AM   Specimen: Urine, Catheterized; Nasopharyngeal  Result Value Ref Range Status   MRSA by PCR  NEGATIVE NEGATIVE Final    Comment:        The GeneXpert MRSA Assay (FDA approved for NASAL specimens only), is one component of [facility] comprehensive MRSA colonization surveillance program. It is not intended to diagnose MRSA infection nor to guide or monitor treatment for MRSA infections. Performed at [facility], [address] [place], [place] [address]           IMAGING    EEG  Result Date: 06/09/2019 [facility] Goodell, MD     06/09/2019  1:13 PM ELECTROENCEPHALOGRAM REPORT Patient: [facility]       Room #: IC17A-AA EEG No. ID: [id number] Age: 59 y.o.        Sex: female Requesting Physician: [facility] Report Date:  06/09/2019       Interpreting Physician: [facility] History: [facility] is an 58 y.o. female s/p arrest Medications: Nimbex, Fentanyl, Propofol, Levophed, Insulin Conditions of Recording:  This is [facility] 21 channel routine scalp EEG performed with bipolar and monopolar montages arranged in accordance to the international 10/20 system of electrode placement. One channel was dedicated to EKG recording. The patient is in the intubated and sedated state. Description:  The background activity is discontinuous. It consists of generalized bursts of polyspike, spike and slow wave activity alternating with periods of attenuation. The periods of attenuation are short and generalized as well, lasing up to 1-2 seconds.  The burst activity is often of higher voltage and consisting of more sharp activity over the left parietal region.  For short bursts, at times, this activity is rhythmic.  Hyperventilation and intermittent photic stimulation were not performed IMPRESSION: This is an abnormal EEG due to [facility] burst-suppression pattern seen throughout the tracing with some rhythmicity noted in the left parietal region.  Burst suppression pattern can be seen in [facility] variety of circumstances, including anesthesia, drug intoxication, hypothermia, as well as cerebral anoxia.  Clinical/neurological,  and radiographic correlation advised.  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  Addendum: Results discussed with Dr. Tilman Neat, MD Neurology [phone] 06/09/2019, 1:06 PM     Nutrition Status:      Indwelling Urinary Catheter continued, requirement due to   Reason to continue Indwelling Urinary Catheter strict Intake/Output monitoring for hemodynamic instability   Central Line/ continued, requirement due to  Reason to continue [place] of central venous pressure or other hemodynamic parameters and poor IV access   Ventilator continued, requirement due to severe respiratory failure   Ventilator Sedation RASS 0 to -2      ASSESSMENT AND PLAN SYNOPSIS  Severe ACUTE Hypoxic and Hypercapnic Respiratory Failurecardiac arrest and severe resp failure from low potassium with heat exhaustion with alcohol abuse Severe cardiogenic shock and multiorgan failure with severe systolic CHF   Severe ACUTE Hypoxic and Hypercapnic Respiratory Failure -continue Full MV support -continue Bronchodilator Therapy -Wean Fio2 and PEEP as tolerated -will perform SAT/SBT when respiratory parameters are met -VAP/VENT bundle implementation CXR reviewed-need to advance ETT 3 CM   ACUTE SYSTOLIC CARDIAC FAILURE- EF 30% ?tricuspid vegetation May need TEE -oxygen as needed -Lasix as tolerated -follow up cardiac enzymes as indicated -follow up cardiology recs   Morbid obesity, possible OSA.   Will certainly impact respiratory mechanics, ventilator weaning Suspect will need to consider  additional PEEP    ACUTE KIDNEY INJURY/Renal Failure -continue Foley Catheter-assess need -Avoid nephrotoxic agents -Follow urine output, BMP -Ensure adequate renal perfusion, optimize oxygenation -Renal dose medications     NEUROLOGY - intubated and sedated - minimal sedation to achieve [facility] RASS goal:  -1 Wake up assessment pending  Acute toxic metabolic encephalopath Very concerning for brain damage from anoxia Stop propofol, wean off sedation Start versed   SHOCK-CARDIOGENIC -use vasopressors to keep MAP>65 -follow ABG and LA -follow up cultures -emperic ABX -consider stress dose steroids  CARDIAC ICU monitoring  ID -continue IV abx as prescibed -follow up cultures  GI GI PROPHYLAXIS as indicated  NUTRITIONAL STATUS Nutrition Status:        DIET-->on hold Constipation protocol as indicated  ENDO - will use ICU hypoglycemic\Hyperglycemia protocol if indicated    ELECTROLYTES -follow labs as needed -replace as needed -pharmacy consultation and following   DVT/GI PRX ordered and assessed TRANSFUSIONS AS NEEDED MONITOR FSBS I Assessed the need for Labs I Assessed the need for Foley I Assessed the need for Central Venous Line Family Discussion when available I Assessed the need for Mobilization I made an Assessment of medications to be adjusted accordingly Safety Risk assessment completed   CASE DISCUSSED IN MULTIDISCIPLINARY ROUNDS WITH ICU TEAM  Critical Care Time devoted to patient care services described in this note is 34 minutes.   Overall, patient is critically ill, prognosis is guarded.  Patient with Multiorgan failure and at high risk for cardiac arrest and death.    Corrin Parker, M.D.  [facility]  Medical Director [place] Director [facility]io-P[facility] Department

## 2019-06-10 NOTE — Progress Notes (Signed)
[facility] Cardiology    SUBJECTIVE: Patient critically ill, sedated, mechanically ventilated.   Vitals:   06/10/19 0615 06/10/19 0630 06/10/19 0645 06/10/19 0700  BP:      Pulse: 73 72    Resp: (!) 24 (!) 24 (!) 24 (!) 24  Temp:    (!) 97.5 F (36.4 C)  TempSrc:      SpO2: 92% 92%  (!) 84%  Weight:      Height:         Intake/Output Summary (Last 24 hours) at 06/10/2019 0820 Last data filed at 06/10/2019 [id number] Gross per 24 hour  Intake 6181.75 ml  Output 470 ml  Net 5711.75 ml      PHYSICAL EXAM  General: Critically ill appearing HEENT:  Normocephalic and atramatic Neck:  No JVD.  Lungs: mechanically ventilated Heart: HRRR . Normal S1 and S2 without gallops or murmurs.  Abdomen: nondistended Extremities: No clubbing, cyanosis or edema.   Neuro: obtunded    LABS: Basic Metabolic Panel: Recent Labs    06/09/19 1400 06/09/19 1600 06/10/19 0017 06/10/19 0546  NA 133*   < > 131* 130*  K 3.6   < > 4.5 3.8  CL 101   < > 101 98  CO2 20*   < > 15* 18*  GLUCOSE 149*   < > 111* 111*  BUN 12   < > 13 14  CREATININE 0.60   < > 1.06* 1.30*  CALCIUM 6.6*   < > 6.5* 6.2*  MG 2.2  --   --  2.7*  PHOS 3.0  --   --  5.3*   < > = values in this interval not displayed.   Liver Function Tests: Recent Labs    06/09/19 0358 06/09/19 0358 06/09/19 0600 06/10/19 0546  AST 139*  --  139*  --   ALT 195*  --  198*  --   ALKPHOS 93  --  93  --   BILITOT 1.1  --  1.3*  --   PROT 5.6*  --  6.0*  --   ALBUMIN 2.9*   < > 3.2* 2.7*   < > = values in this interval not displayed.   No results for input(s): LIPASE, AMYLASE in the last 72 hours. CBC: Recent Labs    06/05/2019 2125 06/08/19 0404 06/09/19 0600 06/10/19 0546  WBC 26.1*   < > 13.6* 6.4  NEUTROABS 16.0*  --  11.8*  --   HGB 13.9   < > 13.6 11.5*  HCT 39.9   < > 37.6 32.3*  MCV 90.9   < > 87.4 91.0  PLT 377   < > 238 306   < > = values in this interval not displayed.   Cardiac Enzymes: No results for input(s):  CKTOTAL, CKMB, CKMBINDEX, TROPONINI in the last 72 hours. BNP: Invalid input(s): POCBNP D-Dimer: No results for input(s): DDIMER in the last 72 hours. Hemoglobin A1C: Recent Labs    06/08/19 0404  HGBA1C 5.4   Fasting Lipid Panel: Recent Labs    06/08/19 0404  CHOL 193  HDL 80  LDLCALC 34  TRIG 397*  CHOLHDL 2.4   Thyroid Function Tests: No results for input(s): TSH, T4TOTAL, T3FREE, THYROIDAB in the last 72 hours.  Invalid input(s): FREET3 Anemia Panel: No results for input(s): VITAMINB12, FOLATE, FERRITIN, TIBC, IRON, RETICCTPCT in the last 72 hours.  EEG  Result Date: 06/09/2019 [facility] Goodell, MD     06/09/2019  1:13 PM ELECTROENCEPHALOGRAM REPORT Patient:  [facility]       Room #: IC17A-AA EEG No. ID: [id number] Age: 59 y.o.        Sex: female Requesting Physician: Kasa Report Date:  06/09/2019       Interpreting Physician: [facility] History: [facility] is an 59 y.o. female s/p arrest Medications: Nimbex, Fentanyl, Propofol, Levophed, Insulin Conditions of Recording:  This is a 21 channel routine scalp EEG performed with bipolar and monopolar montages arranged in accordance to the international 10/20 system of electrode placement. One channel was dedicated to EKG recording. The patient is in the intubated and sedated state. Description:  The background activity is discontinuous. It consists of generalized bursts of polyspike, spike and slow wave activity alternating with periods of attenuation. The periods of attenuation are short and generalized as well, lasing up to 1-2 seconds.  The burst activity is often of higher voltage and consisting of more sharp activity over the left parietal region.  For short bursts, at times, this activity is rhythmic.  Hyperventilation and intermittent photic stimulation were not performed IMPRESSION: This is an abnormal EEG due to a burst-suppression pattern seen throughout the tracing with some rhythmicity noted in the left parietal  region.  Burst suppression pattern can be seen in a variety of circumstances, including anesthesia, drug intoxication, hypothermia, as well as cerebral anoxia.  Clinical/neurological, and radiographic correlation advised.  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  Addendum: Results discussed with Dr. Tilman Neat, MD Neurology [phone] 06/09/2019, 1:06 PM   EEG adult  Result Date: 06/08/2019 [facility] Goodell, MD     06/08/2019  2:12 PM ELECTROENCEPHALOGRAM REPORT Patient: [facility]       Room #: IC17A-AA EEG No. ID: [id number] Age: 59 y.o.        Sex: female Requesting Physician: Kasa Report Date:  06/08/2019       Interpreting Physician: [facility] History: [facility] is an 59 y.o. female s/p arrest Medications: Nimbex, Fentanyl, Propofol, Levophed, Insulin Conditions of Recording:  This is a 21 channel routine scalp EEG performed with bipolar and monopolar montages arranged in accordance to the international 10/20 system of electrode placement. One channel was dedicated to EKG recording. The patient is in the intubated and sedated state. Description:  The background activity is low voltage, slow and poorly organized.  There are periods of symmetrical, fairly well organized beta activity that are in a distribution that suggests symmetrical sleep spindles.  At times though this activity is only seen over the left parietal region and takes on the appearance of a sharp wave.in that focal region.  There is no evidence of electrographic seizures. Hyperventilation and intermittent photic stimulation were not performed IMPRESSION: This electroencephalogram is characterized by activity that is suggestive of sleep with some focal activity in the left parietal region that may suggest an irritable focus in that region.  Clinical/neurological, and radiographic correlation advised.  There is no evidence of electrographic  seizures. [facility] Goodell, MD Neurology [phone] 06/08/2019, 2:05 PM   ECHOCARDIOGRAM COMPLETE  Result Date: 06/09/2019    ECHOCARDIOGRAM REPORT   Patient Name:   [facility] Date of Exam: 06/08/2019 Medical Rec #:  [id number]      Height:       66.0 in Accession #:    [id number]     Weight:       267.0 lb Date of Birth:  09/23/1960      BSA:  2.261 m Patient Age:    59 years       BP:           104/62 mmHg Patient Gender: F              HR:           65 bpm. Exam Location:  [facility] Procedure: 2D Echo, Color Doppler and Cardiac Doppler Indications:     I46.9 Cardiac arrest  History:         Patient has no prior history of Echocardiogram examinations.                  Risk Factors:Hypertension. No medical history.  Sonographer:     Charmayne Sheer RDCS (AE) Referring Phys:  [place] Diagnosing Phys: Yolonda Kida MD  Sonographer Comments: Suboptimal subcostal window. IMPRESSIONS  1. Left ventricular ejection fraction, by estimation, is 30 to 35%. The left ventricle has moderate to severely decreased function. The left ventricle demonstrates global hypokinesis. The left ventricular internal cavity size was moderately dilated. Left ventricular diastolic parameters are consistent with Grade I diastolic dysfunction (impaired relaxation).  2. Right ventricular systolic function is mildly reduced. The right ventricular size is mildly enlarged.  3. Left atrial size was mildly dilated.  4. Right atrial size was mildly dilated.  5. The mitral valve is grossly normal. Mild mitral valve regurgitation.  6. Large mass like lesion on TR valve free mobile , possible vegetation?Marland Kitchen The tricuspid valve is myxomatous.  7. The aortic valve is abnormal. Aortic valve regurgitation is not visualized. Conclusion(s)/Recommendation(s): Findings concerning for tricuspid valve vegetation, would recommend a Transesophageal Echocardiogram for clarification. FINDINGS  Left Ventricle: Left ventricular ejection fraction,  by estimation, is 30 to 35%. The left ventricle has moderate to severely decreased function. The left ventricle demonstrates global hypokinesis. The left ventricular internal cavity size was moderately  dilated. There is no left ventricular hypertrophy. Left ventricular diastolic parameters are consistent with Grade I diastolic dysfunction (impaired relaxation). Right Ventricle: The right ventricular size is mildly enlarged. No increase in right ventricular wall thickness. Right ventricular systolic function is mildly reduced. Left Atrium: Left atrial size was mildly dilated. Right Atrium: Right atrial size was mildly dilated. Pericardium: There is no evidence of pericardial effusion. Mitral Valve: The mitral valve is grossly normal. Mild mitral valve regurgitation. MV peak gradient, 4.4 mmHg. The mean mitral valve gradient is 2.0 mmHg. Tricuspid Valve: Large mass like lesion on TR valve free mobile , possible vegetation?Marland Kitchen The tricuspid valve is myxomatous. Tricuspid valve regurgitation is mild. Aortic Valve: The aortic valve is abnormal. Aortic valve regurgitation is not visualized. Aortic valve mean gradient measures 4.0 mmHg. Aortic valve peak gradient measures 7.5 mmHg. Aortic valve area, by VTI measures 2.04 cm. Pulmonic Valve: The pulmonic valve was normal in structure. Pulmonic valve regurgitation is not visualized. Aorta: The aortic root is normal in size and structure. IAS/Shunts: No atrial level shunt detected by color flow Doppler.  LEFT VENTRICLE PLAX 2D LVIDd:         5.12 cm      Diastology LVIDs:         4.20 cm      LV e' lateral:   7.83 cm/s LV PW:         1.04 cm      LV E/e' lateral: 9.3 LV IVS:        0.77 cm      LV e' medial:    5.00 cm/s  LVOT diam:     2.00 cm      LV E/e' medial:  14.6 LV SV:         45 LV SV Index:   20 LVOT Area:     3.14 cm  LV Volumes (MOD) LV vol d, MOD A2C: 139.0 ml LV vol d, MOD A4C: 117.0 ml LV vol s, MOD A2C: 100.0 ml LV vol s, MOD A4C: 84.6 ml LV SV MOD A2C:      39.0 ml LV SV MOD A4C:     117.0 ml LV SV MOD BP:      32.3 ml LEFT ATRIUM             Index       RIGHT ATRIUM          Index LA diam:        2.90 cm 1.28 cm/m  RA Area:     8.85 cm LA Vol (A2C):   37.4 ml 16.54 ml/m RA Volume:   17.00 ml 7.52 ml/m LA Vol (A4C):   16.4 ml 7.25 ml/m LA Biplane Vol: 26.9 ml 11.90 ml/m  AORTIC VALVE                   PULMONIC VALVE AV Area (Vmax):    1.93 cm    PV Vmax:       1.03 m/s AV Area (Vmean):   1.88 cm    PV Vmean:      65.800 cm/s AV Area (VTI):     2.04 cm    PV VTI:        0.189 m AV Vmax:           137.00 cm/s PV Peak grad:  4.2 mmHg AV Vmean:          87.000 cm/s PV Mean grad:  2.0 mmHg AV VTI:            0.222 m AV Peak Grad:      7.5 mmHg AV Mean Grad:      4.0 mmHg LVOT Vmax:         84.20 cm/s LVOT Vmean:        52.200 cm/s LVOT VTI:          0.144 m LVOT/AV VTI ratio: 0.65  AORTA Ao Root diam: 2.90 cm MITRAL VALVE MV Area (PHT): 3.17 cm     SHUNTS MV Peak grad:  4.4 mmHg     Systemic VTI:  0.14 m MV Mean grad:  2.0 mmHg     Systemic Diam: 2.00 cm MV Vmax:       1.05 m/s MV Vmean:      65.8 cm/s MV Decel Time: 239 msec MV E velocity: 72.80 cm/s MV A velocity: 109.00 cm/s MV E/A ratio:  0.67 Dwayne D Callwood MD Electronically signed by Yolonda Kida MD Signature Date/Time: 06/09/2019/8:39:38 AM    Final      Echo Echo: Moderate to severely decreased left ventricular function with LVEF 30-35%, large mobile mass-like lesion on tricuspid valve, vegetation vs. thrombus   TELEMETRY: sinus rhythm, 70s  ASSESSMENT AND PLAN:  Active Problems:   Cardiac arrest ([place])    1. Cardiac arrest, requiring CPR, ACLS, intubation and mechanical ventilation, with apparent ventricular fibrillation requiring cardioversion x 3, on amiodarone drip. 2. Normal coronary anatomy, status post emergent cardiac catheterization 3. 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 4. Severe acute hypoxic respiratory failure, on mechanical ventilator support. 5. Cardiogenic shock, on vasopressors  Recommendations: 1. Defer TEE at this time due to patient's critical state 2. No further cardiac diagnostics at this time  Clabe Seal, Hershal Coria 06/10/2019 8:20 AM  Sign off for now; discussed with Dr. Saralyn Pilar who agrees with above plan.

## 2019-06-11 DIAGNOSIS — R570 Cardiogenic shock: Secondary | ICD-10-CM

## 2019-06-11 LAB — BLOOD GAS, ARTERIAL
Acid-base deficit: 17.9 mmol/L — ABNORMAL HIGH (ref 0.0–2.0)
Acid-base deficit: 21.7 mmol/L — ABNORMAL HIGH (ref 0.0–2.0)
Bicarbonate: 12.6 mmol/L — ABNORMAL LOW (ref 20.0–28.0)
Bicarbonate: 9.9 mmol/L — ABNORMAL LOW (ref 20.0–28.0)
FIO2: 1
FIO2: 1
Mechanical Rate: 24
O2 Saturation: 68.4 %
O2 Saturation: 67.1 %
PEEP: 15 cmH2O
PEEP: 15 cmH2O
Patient temperature: 36.8
Patient temperature: 37
Pressure control: 19 cmH2O
Pressure control: 19 cmH2O
RATE: 24 resp/min
RATE: 24 resp/min
pCO2 arterial: 50 mmHg — ABNORMAL HIGH (ref 32.0–48.0)
pCO2 arterial: 44 mmHg (ref 32.0–48.0)
pH, Arterial: 7.01 — CL (ref 7.350–7.450)
pH, Arterial: 6.96 — CL (ref 7.350–7.450)
pO2, Arterial: 54 mmHg — ABNORMAL LOW (ref 83.0–108.0)
pO2, Arterial: 57 mmHg — ABNORMAL LOW (ref 83.0–108.0)

## 2019-06-11 LAB — BASIC METABOLIC PANEL
Anion gap: 31 — ABNORMAL HIGH (ref 5–15)
BUN: 18 mg/dL (ref 6–20)
CO2: 18 mmol/L — ABNORMAL LOW (ref 22–32)
Calcium: 5.5 mg/dL — CL (ref 8.9–10.3)
Chloride: 94 mmol/L — ABNORMAL LOW (ref 98–111)
Creatinine, Ser: 2.68 mg/dL — ABNORMAL HIGH (ref 0.44–1.00)
GFR calc Af Amer: 22 mL/min — ABNORMAL LOW (ref >60)
GFR calc non Af Amer: 19 mL/min — ABNORMAL LOW (ref >60)
Glucose, Bld: 130 mg/dL — ABNORMAL HIGH (ref 70–99)
Potassium: 4.9 mmol/L (ref 3.5–5.1)
Sodium: 143 mmol/L (ref 135–145)

## 2019-06-11 LAB — GLUCOSE, CAPILLARY
Glucose-Capillary: 72 mg/dL (ref 70–99)
Glucose-Capillary: 46 mg/dL — ABNORMAL LOW (ref 70–99)
Glucose-Capillary: 66 mg/dL — ABNORMAL LOW (ref 70–99)
Glucose-Capillary: 88 mg/dL (ref 70–99)
Glucose-Capillary: 93 mg/dL (ref 70–99)

## 2019-06-11 LAB — MAGNESIUM: Magnesium: 2.3 mg/dL (ref 1.7–2.4)

## 2019-06-11 LAB — CBC
HCT: 32.7 % — ABNORMAL LOW (ref 36.0–46.0)
Hemoglobin: 11 g/dL — ABNORMAL LOW (ref 12.0–15.0)
MCH: 33.1 pg (ref 26.0–34.0)
MCHC: 33.6 g/dL (ref 30.0–36.0)
MCV: 98.5 fL (ref 80.0–100.0)
Platelets: 148 10*3/uL — ABNORMAL LOW (ref 150–400)
RBC: 3.32 MIL/uL — ABNORMAL LOW (ref 3.87–5.11)
RDW: 14 % (ref 11.5–15.5)
WBC: 16.3 10*3/uL — ABNORMAL HIGH (ref 4.0–10.5)
nRBC: 3.4 % — ABNORMAL HIGH (ref 0.0–0.2)

## 2019-06-11 MED ORDER — DEXTROSE 50 % IV SOLN
25.0000 mL | Freq: Once | INTRAVENOUS | Status: AC
  Administered 2019-06-11: 25 mL via INTRAVENOUS

## 2019-06-11 MED ORDER — DEXTROSE 10 % IV SOLN: INTRAVENOUS | Status: DC

## 2019-06-11 MED ORDER — SODIUM BICARBONATE-DEXTROSE 150-5 MEQ/L-% IV SOLN
150.0000 meq | INTRAVENOUS | Status: DC
  Administered 2019-06-11: 150 meq via INTRAVENOUS
  Filled 2019-06-11: qty 1000

## 2019-06-11 MED ORDER — SODIUM BICARBONATE 8.4 % IV SOLN
100.0000 meq | Freq: Once | INTRAVENOUS | Status: AC
  Administered 2019-06-11: 100 meq via INTRAVENOUS

## 2019-06-11 MED ORDER — CALCIUM GLUCONATE-NACL 2-0.675 GM/100ML-% IV SOLN
2.0000 g | Freq: Once | INTRAVENOUS | Status: AC
  Administered 2019-06-11: 2000 mg via INTRAVENOUS
  Filled 2019-06-11: qty 100

## 2019-06-16 NOTE — Progress Notes (Signed)
CRITICAL CARE NOTE 59 yo WF with s/p cardiac arrest witnessed CPR 20-30 mins On Hypothermia Protocol with multiorgan failure +ETOH USE and low potassium, was out doors possible heat exhaustion  5/23admitted for cardiac arrest 5/24 multiorgan failure, remains on hypothermia protocol Family Updated, poor prognosis 5/25 ART LINE PLACED, multiorgan failure, hypothermia protocol-rewarming phrase 5/26 rewarming  phase completed, severe cardiogenic shock, severe acidosis  CC  follow up respiratory failure  SUBJECTIVE Patient remains critically ill Prognosis is guarded Ef 30% Cardiogenic shock, severe acidosis   BP (!) 73/55   Pulse 75   Temp 98.6 F (37 C) (Bladder)   Resp (!) 26   Ht 5' 5.98" (1.676 m)   Wt 121.1 kg   SpO2 96%   BMI 43.11 kg/m    I/O last 3 completed shifts: In: 9015.2 [I.V.:7965.2; IV Piggyback:1050] Out: 560 [Urine:360; Emesis/NG output:200] No intake/output data recorded.  SpO2: 96 % FiO2 (%): 100 %  Estimated body mass index is 43.11 kg/m as calculated from the following:   Height as of this encounter: 5' 5.98" (1.676 m).   Weight as of this encounter: 121.1 kg.  SIGNIFICANT EVENTS   REVIEW OF SYSTEMS  PATIENT IS UNABLE TO PROVIDE COMPLETE REVIEW OF SYSTEMS DUE TO SEVERE CRITICAL ILLNESS        PHYSICAL EXAMINATION:  GENERAL:critically ill appearing, +resp distress HEAD: Normocephalic, atraumatic.  EYES: Pupils equal, round, reactive to light.  No scleral icterus.  MOUTH: Moist mucosal membrane. NECK: Supple.  PULMONARY: +rhonchi, +wheezing CARDIOVASCULAR: S1 and S2. Regular rate and rhythm. No murmurs, rubs, or gallops.  GASTROINTESTINAL: Soft, nontender, -distended.  Positive bowel sounds.   MUSCULOSKELETAL: No swelling, clubbing, or edema.  NEUROLOGIC: obtunded, GCS<8 SKIN:intact,warm,dry  MEDICATIONS: I have reviewed all medications and confirmed regimen as documented   CULTURE RESULTS   Recent Results (from the past  240 hour(s))  SARS Coronavirus 2 by RT PCR (hospital order, performed in [facility] hospital lab) Nasopharyngeal Nasopharyngeal Swab     Status: None   Collection Time: 05/19/2019  9:16 PM   Specimen: Nasopharyngeal Swab  Result Value Ref Range Status   SARS Coronavirus 2 NEGATIVE NEGATIVE Final    Comment: (NOTE) SARS-CoV-2 target nucleic acids are NOT DETECTED. The SARS-CoV-2 RNA is generally detectable in upper and lower respiratory specimens during the acute phase of infection. The lowest concentration of SARS-CoV-2 viral copies this assay can detect is 250 copies / mL. A negative result does not preclude SARS-CoV-2 infection and should not be used as the sole basis for treatment or other patient management decisions.  A negative result may occur with improper specimen collection / handling, submission of specimen other than nasopharyngeal swab, presence of viral mutation(s) within the areas targeted by this assay, and inadequate number of viral copies (<250 copies / mL). A negative result must be combined with clinical observations, patient history, and epidemiological information. Fact Sheet for Patients:   StrictlyIdeas.no Fact Sheet for Healthcare Providers: BankingDealers.co.za This test is not yet approved or cleared  by the [place] FDA and has been authorized for detection and/or diagnosis of SARS-CoV-2 by FDA under an Emergency Use Authorization (EUA).  This EUA will remain in effect (meaning this test can be used) for the duration of the COVID-19 declaration under Section 564(b)(1) of the Act, 21 U.S.C. section 360bbb-3(b)(1), unless the authorization is terminated or revoked sooner. Performed at [facility], [address], [place], [place] [address]   MRSA PCR Screening     Status: None  Collection Time: 06/08/19  3:15 AM   Specimen: Urine, Catheterized; Nasopharyngeal  Result Value Ref Range Status    MRSA by PCR NEGATIVE NEGATIVE Final    Comment:        The GeneXpert MRSA Assay (FDA approved for NASAL specimens only), is one component of a comprehensive MRSA colonization surveillance program. It is not intended to diagnose MRSA infection nor to guide or monitor treatment for MRSA infections. Performed at [facility], [address] [place], [place] [address]           IMAGING    EEG  Result Date: 06/10/2019 Alexis Goodell, MD     06/10/2019  2:20 PM ELECTROENCEPHALOGRAM REPORT Patient: Meghan Young       Room #: IC17A-AA EEG No. ID: [id number] Age: 59 y.o.        Sex: female Requesting Physician: Caeleigh Prohaska Report Date:  06/10/2019       Interpreting Physician: Alexis Goodell History: Meghan Young is an 59 y.o. female s/p arrest Medications: Pitressin, Levophed, Insulin, Unasyn, Keppra Conditions of Recording:  This is a 21 channel routine scalp EEG performed with bipolar and monopolar montages arranged in accordance to the international 10/20 system of electrode placement. One channel was dedicated to EKG recording. The patient is in the intubated and unresponsive state. Description:  The background activity is slow and poorly organized.  It consists of low voltage activity in the delta-theta continuum.  There are occasional, intermittent vertex central sharp transients noted as well.  This activity is persistent throughout the recording. The is no evidence of subclinical status epilepticus. Hyperventilation and intermittent photic stimulation were not performed. IMPRESSION: This electroencephalogram is significant for general background slowing with intermittent vertex central sharp transients noted.  There is no longer evidence of subclinical status epilepticus. Alexis Goodell, MD Neurology [phone] 06/10/2019, 2:15 PM     Nutrition Status:           Indwelling Urinary Catheter continued, requirement due to   Reason to continue Indwelling Urinary Catheter  strict Intake/Output monitoring for hemodynamic instability   Central Line/ continued, requirement due to  Reason to continue [place] of central venous pressure or other hemodynamic parameters and poor IV access   Ventilator continued, requirement due to severe respiratory failure   Ventilator Sedation RASS 0 to -2      ASSESSMENT AND PLAN SYNOPSIS Severe ACUTE Hypoxic and Hypercapnic Respiratory Failurecardiac arrest and severe resp failure from low potassium with heat exhaustion with alcohol abuse Severe cardiogenic shock and multiorgan failure with severe systolic CHF   Severe ACUTE Hypoxic and Hypercapnic Respiratory Failure -continue Full MV support -continue Bronchodilator Therapy -Wean Fio2 and PEEP as tolerated -VAP/VENT bundle implementation  ACUTE SYSTOLIC CARDIAC FAILURE- EF 30% -oxygen as needed -Lasix as tolerated -follow up cardiac enzymes as indicated -follow up cardiology recs   Morbid obesity, possible OSA.   Will certainly impact respiratory mechanics, ventilator weaning   ACUTE KIDNEY INJURY/Renal Failure -continue Foley Catheter-assess need -Avoid nephrotoxic agents -Follow urine output, BMP -Ensure adequate renal perfusion, optimize oxygenation -Renal dose medications     NEUROLOGY Acute toxic metabolic encephalopathy  SHOCK-CARDIOGENIC -use vasopressors to keep MAP>65 -follow ABG and LA -follow up cultures -emperic ABX -consider stress dose steroids  CARDIAC ICU monitoring  ID -continue IV abx as prescibed -follow up cultures  GI GI PROPHYLAXIS as indicated  NUTRITIONAL STATUS Nutrition Status:         DIET-->NPO Constipation protocol as indicated  ENDO - will use  ICU hypoglycemic\Hyperglycemia protocol if indicated     ELECTROLYTES -follow labs as needed -replace as needed -pharmacy consultation and following   DVT/GI PRX ordered and assessed TRANSFUSIONS AS NEEDED MONITOR FSBS I Assessed  the need for Labs I Assessed the need for Foley I Assessed the need for Central Venous Line Family Discussion when available I Assessed the need for Mobilization I made an Assessment of medications to be adjusted accordingly Safety Risk assessment completed   CASE DISCUSSED IN MULTIDISCIPLINARY ROUNDS WITH ICU TEAM  Critical Care Time devoted to patient care services described in this note is 32 minutes.   Overall, patient is critically ill, prognosis is guarded.  Patient with Multiorgan failure and at high risk for cardiac arrest and death.   Patient is DNR, very low chance of meaningful recovery  Corrin Parker, M.D.  [facility]  Medical Director [place] Director [facility] Cardio-Pulmonary Department

## 2019-06-16 NOTE — Death Summary Note (Signed)
DEATH SUMMARY   Patient Details  Name: Meghan Young MRN: [id number] DOB: [birth year]  Admission/Discharge Information   Admit Date:  2019-06-16  Date of Death:   June 20, 2019   Time of Death:  47  Length of Stay: 4  Referring Physician: McLean-Scocuzza, Nino Glow, MD   Reason(s) for Hospitalization  Cardiac arrest  Diagnoses  Preliminary cause of death: NON-ISCHEMIC CARDIOMYOPATHY, ANOXIC BRAN INJURY CARDIAC ARREST Secondary Diagnoses (including complications and co-morbidities):  Active Problems:   Cardiac arrest [facility])  59 yo WF with s/p cardiac arrest witnessed CPR 20-30 mins On Hypothermia Protocol with multiorgan failure +ETOH USE and low potassium, was out doors possible heat exhaustion  5/23admitted for cardiac arrest, CVL placed 5/24 multiorgan failure, remains on hypothermia protocol Family Updated, poor prognosis 5/25 ART LINE PLACED, multiorgan failure, hypothermia protocol-rewarming phrase 5/26 rewarming phase completed, severe cardiogenic shock, severe acidosis  20-Jun-2022 patient showing signs of severe brain damage and multiorgan failure with cardiogenic shock  The Clinical status was relayed to family in detail.  Updated and notified of patients medical condition.  Patient remains unresponsive and will not open eyes to command.   Explained to family course of therapy and the modalities     Patient with Progressive multiorgan failure with very low chance of meaningful recovery despite all aggressive and optimal medical therapy.  Patient is in the dying  Process associated with suffering.  Family understands the situation.  They have consented and agreed to DNR/DNI and would like to proceed with Comfort care measures.  Family are satisfied with Plan of action and management. All questions answered        Pertinent Labs and Studies  Significant Diagnostic Studies EEG  Result Date: 06/10/2019 Alexis Goodell, MD     06/10/2019  2:20 PM  ELECTROENCEPHALOGRAM REPORT Patient: Tamariah Pehl       Room #: IC17A-AA EEG No. ID: [id number] Age: 59 y.o.        Sex: female Requesting Physician: Bradie Sangiovanni Report Date:  06/10/2019       Interpreting Physician: Alexis Goodell History: Johnnay Varnadoe is an 59 y.o. female s/p arrest Medications: Pitressin, Levophed, Insulin, Unasyn, Keppra Conditions of Recording:  This is a 21 channel routine scalp EEG performed with bipolar and monopolar montages arranged in accordance to the international 10/20 system of electrode placement. One channel was dedicated to EKG recording. The patient is in the intubated and unresponsive state. Description:  The background activity is slow and poorly organized.  It consists of low voltage activity in the delta-theta continuum.  There are occasional, intermittent vertex central sharp transients noted as well.  This activity is persistent throughout the recording. The is no evidence of subclinical status epilepticus. Hyperventilation and intermittent photic stimulation were not performed. IMPRESSION: This electroencephalogram is significant for general background slowing with intermittent vertex central sharp transients noted.  There is no longer evidence of subclinical status epilepticus. Alexis Goodell, MD Neurology [phone] 06/10/2019, 2:15 PM   EEG  Result Date: 06/09/2019 Alexis Goodell, MD     06/09/2019  1:13 PM ELECTROENCEPHALOGRAM REPORT Patient: Tiyana Kerkstra       Room #: IC17A-AA EEG No. ID: [id number] Age: 59 y.o.        Sex: female Requesting Physician: Eisley Barber Report Date:  06/09/2019       Interpreting Physician: Alexis Goodell History: Smanatha Crumbliss is an 59 y.o. female s/p arrest Medications: Nimbex, Fentanyl, Propofol, Levophed, Insulin Conditions of Recording:  This is a 21 channel routine scalp EEG performed with  bipolar and monopolar montages arranged in accordance to the international 10/20 system of electrode placement. One channel was dedicated to EKG  recording. The patient is in the intubated and sedated state. Description:  The background activity is discontinuous. It consists of generalized bursts of polyspike, spike and slow wave activity alternating with periods of attenuation. The periods of attenuation are short and generalized as well, lasing up to 1-2 seconds.  The burst activity is often of higher voltage and consisting of more sharp activity over the left parietal region.  For short bursts, at times, this activity is rhythmic.  Hyperventilation and intermittent photic stimulation were not performed IMPRESSION: This is an abnormal EEG due to a burst-suppression pattern seen throughout the tracing with some rhythmicity noted in the left parietal region.  Burst suppression pattern can be seen in a variety of circumstances, including anesthesia, drug intoxication, hypothermia, as well as cerebral anoxia.  Clinical/neurological, and radiographic correlation advised.  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  Addendum: Results discussed with Dr. Tilman Neat, MD Neurology [phone] 06/09/2019, 1:06 PM   CT Head Wo Contrast  Result Date: 06/08/2019 CLINICAL DATA:  Post cardiac arrest patient unresponsive EXAM: CT HEAD WITHOUT CONTRAST TECHNIQUE: Contiguous axial images were obtained from the base of the skull through the vertex without intravenous contrast. COMPARISON:  None. FINDINGS: Brain: No evidence of acute infarction, hemorrhage, hydrocephalus, extra-axial collection or mass lesion/mass effect. Symmetric prominence of the ventricles, cisterns and sulci compatible with frontal lobe predominant parenchymal volume loss. Patchy areas of white matter hypoattenuation are most compatible with chronic microvascular angiopathy. Vascular: Minimal plaque in the carotid siphons. No abnormal hyperdense vessel. Skull: No calvarial fracture or suspicious osseous lesion. No  scalp swelling or hematoma. Sinuses/Orbits: Minimal thickening in the paranasal sinuses, predominantly the ethmoids may be secondary to recent instrumentation. No layering air-fluid levels. Orbital structures are unremarkable aside from prior lens extractions. Other: None IMPRESSION: 1. No acute intracranial findings. 2. Mild frontal lobe predominant parenchymal volume loss and chronic microvascular angiopathy. Electronically Signed   By: Lovena Le M.D.   On: 06/08/2019 00:00   CT Angio Chest PE W/Cm &/Or Wo Cm  Result Date: 05/28/2019 CLINICAL DATA:  Shortness of breath. COPD. Cardiac arrest. EXAM: CT ANGIOGRAPHY CHEST WITH CONTRAST TECHNIQUE: Multidetector CT imaging of the chest was performed using the standard protocol during bolus administration of intravenous contrast. Multiplanar CT image reconstructions and MIPs were obtained to evaluate the vascular anatomy. CONTRAST:  21mL OMNIPAQUE IOHEXOL 350 MG/ML SOLN COMPARISON:  08/31/2010 FINDINGS: Cardiovascular: Evaluation for pulmonary emboli is limited by respiratory motion artifact and streak artifact from the patient's arms.Given these significant limitations, no acute pulmonary embolism was detected. The main pulmonary artery is within normal limits for size. There is no CT evidence of acute right heart strain. The visualized aorta is normal. Heart size is enlarged. There is no significant pericardial effusion. Mediastinum/Nodes: --No mediastinal or hilar lymphadenopathy. --No axillary lymphadenopathy. --No supraclavicular lymphadenopathy. --Normal thyroid gland. --The esophagus is unremarkable Lungs/Pleura: The endotracheal tube terminates just above the carina. The tube is pointed towards the right mainstem bronchus. Lung volumes are low. There is significant bibasilar atelectasis. There is some mild interlobular septal thickening. There is no pneumothorax. There are trace bilateral pleural effusions. Upper Abdomen: The enteric tube extends into the  stomach. The tip is not visualized on this study. There is hepatic steatosis. Musculoskeletal: Multiple nondisplaced buckle fractures are noted of the  anterior ribs bilaterally. There is no evidence for a fracture of the sternum. Review of the MIP images confirms the above findings. IMPRESSION: 1. Evaluation for pulmonary emboli is limited by respiratory motion artifact and streak artifact from the patient's arms. Given these significant limitations, no acute pulmonary embolism was detected. 2. The endotracheal tube terminates just above the carina. The tube is pointed towards the right mainstem bronchus. Repositioning should be considered. 3. Low lung volumes with significant bibasilar atelectasis. 4. Trace bilateral pleural effusions. 5. Cardiomegaly with mild interlobular septal thickening, suggestive of interstitial pulmonary edema. 6. Hepatic steatosis. 7. Multiple nondisplaced buckle fractures of the anterior ribs bilaterally. No evidence for a fracture of the sternum. No pneumothorax. These results will be called to the ordering clinician or representative by the Radiologist Assistant, and communication documented in the PACS or [employer]. Electronically Signed   By: Constance Holster M.D.   On: 05/28/2019 23:59   CARDIAC CATHETERIZATION  Result Date: 06/08/2019 1.  Cardiac arrest 2.  Normal coronary anatomy 3.  Mild to moderate reduced left ventricular function Recommendations 1.  Admit to ICU 2.  Continue mechanical ventilation 3.  2D echocardiogram  DG Chest Port 1 View  Result Date: 06/10/2019 CLINICAL DATA:  Acute respiratory failure. EXAM: PORTABLE CHEST 1 VIEW COMPARISON:  06/08/2019 FINDINGS: The endotracheal tube has been withdrawn and now terminates nearly 7 cm above the carina. Left jugular catheter terminates over the SVC, unchanged. Enteric tube courses into the abdomen with tip not imaged. The cardiac silhouette remains enlarged. Lung volumes remain low with increasing airspace  opacities in the mid and lower lungs bilaterally as well as in the right greater than left upper lungs. There may be small bilateral pleural effusions. No pneumothorax is identified. IMPRESSION: 1. Worsening bilateral airspace opacities which may reflect edema with small pleural effusions. 2. Interval withdrawal of the endotracheal tube, now nearly 7 cm above the carina. Electronically Signed   By: Logan Bores M.D.   On: 06/10/2019 08:18   DG Chest Port 1 View  Result Date: 06/08/2019 CLINICAL DATA:  Endotracheal tube repositioning. Central line placement. EXAM: PORTABLE CHEST 1 VIEW COMPARISON:  Jun 07, 2019 FINDINGS: The endotracheal tube terminates approximately 3 cm above the carina. There is a new left-sided central venous catheter with tip projecting over the SVC. The heart size remains enlarged. There is no pneumothorax. Low lung volumes with bibasilar atelectasis are noted. IMPRESSION: 1. Lines and tubes as above.  No pneumothorax. 2. Otherwise, no significant short interval change. Electronically Signed   By: Constance Holster M.D.   On: 06/08/2019 01:39   DG Chest Portable 1 View  Result Date: 05/26/2019 CLINICAL DATA:  Cardiac arrest. EXAM: PORTABLE CHEST 1 VIEW COMPARISON:  September 28, 2014 FINDINGS: An endotracheal tube is seen with its distal tip approximately 2.1 cm from the carina. A nasogastric tube is noted with its distal end seen within the body of the stomach. Mild to moderate severity bilateral suprahilar and right infrahilar areas of atelectasis and/or infiltrate are noted. There is no evidence of a pleural effusion or pneumothorax. There is moderate to marked severity enlargement of the cardiac silhouette. Degenerative changes seen within the thoracic spine. IMPRESSION: 1. Endotracheal tube and nasogastric tube in satisfactory position. 2. Moderate to marked severity bilateral atelectasis and/or infiltrate. 3. Moderate to marked severity cardiomegaly. Electronically Signed   By:  Virgina Norfolk M.D.   On: 05/18/2019 22:01   EEG adult  Result Date: 06/08/2019 Alexis Goodell, MD  06/08/2019  2:12 PM ELECTROENCEPHALOGRAM REPORT Patient: Greidy Logwood       Room #: IC17A-AA EEG No. ID: [id number] Age: 59 y.o.        Sex: female Requesting Physician: Shafer Swamy Report Date:  06/08/2019       Interpreting Physician: Alexis Goodell History: Kaybri Mcroberts is an 59 y.o. female s/p arrest Medications: Nimbex, Fentanyl, Propofol, Levophed, Insulin Conditions of Recording:  This is a 21 channel routine scalp EEG performed with bipolar and monopolar montages arranged in accordance to the international 10/20 system of electrode placement. One channel was dedicated to EKG recording. The patient is in the intubated and sedated state. Description:  The background activity is low voltage, slow and poorly organized.  There are periods of symmetrical, fairly well organized beta activity that are in a distribution that suggests symmetrical sleep spindles.  At times though this activity is only seen over the left parietal region and takes on the appearance of a sharp wave.in that focal region.  There is no evidence of electrographic seizures. Hyperventilation and intermittent photic stimulation were not performed IMPRESSION: This electroencephalogram is characterized by activity that is suggestive of sleep with some focal activity in the left parietal region that may suggest an irritable focus in that region.  Clinical/neurological, and radiographic correlation advised.  There is no evidence of electrographic seizures. Alexis Goodell, MD Neurology [phone] 06/08/2019, 2:05 PM   ECHOCARDIOGRAM COMPLETE  Result Date: 06/09/2019    ECHOCARDIOGRAM REPORT   Patient Name:   CASONYA DELARIVA Date of Exam: 06/08/2019 Medical Rec #:  [id number]      Height:       66.0 in Accession #:    [id number]     Weight:       267.0 lb Date of Birth:  April 01, 1960      BSA:          2.261 m Patient Age:    62 years       BP:            104/62 mmHg Patient Gender: F              HR:           65 bpm. Exam Location:  [facility] Procedure: 2D Echo, Color Doppler and Cardiac Doppler Indications:     I46.9 Cardiac arrest  History:         Patient has no prior history of Echocardiogram examinations.                  Risk Factors:Hypertension. No medical history.  Sonographer:     Charmayne Sheer RDCS (AE) Referring Phys:  [place] Diagnosing Phys: Yolonda Kida MD  Sonographer Comments: Suboptimal subcostal window. IMPRESSIONS  1. Left ventricular ejection fraction, by estimation, is 30 to 35%. The left ventricle has moderate to severely decreased function. The left ventricle demonstrates global hypokinesis. The left ventricular internal cavity size was moderately dilated. Left ventricular diastolic parameters are consistent with Grade I diastolic dysfunction (impaired relaxation).  2. Right ventricular systolic function is mildly reduced. The right ventricular size is mildly enlarged.  3. Left atrial size was mildly dilated.  4. Right atrial size was mildly dilated.  5. The mitral valve is grossly normal. Mild mitral valve regurgitation.  6. Large mass like lesion on TR valve free mobile , possible vegetation?Marland Kitchen The tricuspid valve is myxomatous.  7. The aortic valve is abnormal. Aortic valve regurgitation is not visualized. Conclusion(s)/Recommendation(s): Findings concerning for tricuspid valve  vegetation, would recommend a Transesophageal Echocardiogram for clarification. FINDINGS  Left Ventricle: Left ventricular ejection fraction, by estimation, is 30 to 35%. The left ventricle has moderate to severely decreased function. The left ventricle demonstrates global hypokinesis. The left ventricular internal cavity size was moderately  dilated. There is no left ventricular hypertrophy. Left ventricular diastolic parameters are consistent with Grade I diastolic dysfunction (impaired relaxation). Right Ventricle: The right ventricular  size is mildly enlarged. No increase in right ventricular wall thickness. Right ventricular systolic function is mildly reduced. Left Atrium: Left atrial size was mildly dilated. Right Atrium: Right atrial size was mildly dilated. Pericardium: There is no evidence of pericardial effusion. Mitral Valve: The mitral valve is grossly normal. Mild mitral valve regurgitation. MV peak gradient, 4.4 mmHg. The mean mitral valve gradient is 2.0 mmHg. Tricuspid Valve: Large mass like lesion on TR valve free mobile , possible vegetation?Marland Kitchen The tricuspid valve is myxomatous. Tricuspid valve regurgitation is mild. Aortic Valve: The aortic valve is abnormal. Aortic valve regurgitation is not visualized. Aortic valve mean gradient measures 4.0 mmHg. Aortic valve peak gradient measures 7.5 mmHg. Aortic valve area, by VTI measures 2.04 cm. Pulmonic Valve: The pulmonic valve was normal in structure. Pulmonic valve regurgitation is not visualized. Aorta: The aortic root is normal in size and structure. IAS/Shunts: No atrial level shunt detected by color flow Doppler.  LEFT VENTRICLE PLAX 2D LVIDd:         5.12 cm      Diastology LVIDs:         4.20 cm      LV e' lateral:   7.83 cm/s LV PW:         1.04 cm      LV E/e' lateral: 9.3 LV IVS:        0.77 cm      LV e' medial:    5.00 cm/s LVOT diam:     2.00 cm      LV E/e' medial:  14.6 LV SV:         45 LV SV Index:   20 LVOT Area:     3.14 cm  LV Volumes (MOD) LV vol d, MOD A2C: 139.0 ml LV vol d, MOD A4C: 117.0 ml LV vol s, MOD A2C: 100.0 ml LV vol s, MOD A4C: 84.6 ml LV SV MOD A2C:     39.0 ml LV SV MOD A4C:     117.0 ml LV SV MOD BP:      32.3 ml LEFT ATRIUM             Index       RIGHT ATRIUM          Index LA diam:        2.90 cm 1.28 cm/m  RA Area:     8.85 cm LA Vol (A2C):   37.4 ml 16.54 ml/m RA Volume:   17.00 ml 7.52 ml/m LA Vol (A4C):   16.4 ml 7.25 ml/m LA Biplane Vol: 26.9 ml 11.90 ml/m  AORTIC VALVE                   PULMONIC VALVE AV Area (Vmax):    1.93 cm    PV  Vmax:       1.03 m/s AV Area (Vmean):   1.88 cm    PV Vmean:      65.800 cm/s AV Area (VTI):     2.04 cm    PV VTI:  0.189 m AV Vmax:           137.00 cm/s PV Peak grad:  4.2 mmHg AV Vmean:          87.000 cm/s PV Mean grad:  2.0 mmHg AV VTI:            0.222 m AV Peak Grad:      7.5 mmHg AV Mean Grad:      4.0 mmHg LVOT Vmax:         84.20 cm/s LVOT Vmean:        52.200 cm/s LVOT VTI:          0.144 m LVOT/AV VTI ratio: 0.65  AORTA Ao Root diam: 2.90 cm MITRAL VALVE MV Area (PHT): 3.17 cm     SHUNTS MV Peak grad:  4.4 mmHg     Systemic VTI:  0.14 m MV Mean grad:  2.0 mmHg     Systemic Diam: 2.00 cm MV Vmax:       1.05 m/s MV Vmean:      65.8 cm/s MV Decel Time: 239 msec MV E velocity: 72.80 cm/s MV A velocity: 109.00 cm/s MV E/A ratio:  0.67 Dwayne D Callwood MD Electronically signed by Yolonda Kida MD Signature Date/Time: 06/09/2019/8:39:38 AM    Final     Microbiology Recent Results (from the past 240 hour(s))  SARS Coronavirus 2 by RT PCR (hospital order, performed in [place] hospital lab) Nasopharyngeal Nasopharyngeal Swab     Status: None   Collection Time: 05/30/2019  9:16 PM   Specimen: Nasopharyngeal Swab  Result Value Ref Range Status   SARS Coronavirus 2 NEGATIVE NEGATIVE Final    Comment: (NOTE) SARS-CoV-2 target nucleic acids are NOT DETECTED. The SARS-CoV-2 RNA is generally detectable in upper and lower respiratory specimens during the acute phase of infection. The lowest concentration of SARS-CoV-2 viral copies this assay can detect is 250 copies / mL. A negative result does not preclude SARS-CoV-2 infection and should not be used as the sole basis for treatment or other patient management decisions.  A negative result may occur with improper specimen collection / handling, submission of specimen other than nasopharyngeal swab, presence of viral mutation(s) within the areas targeted by this assay, and inadequate number of viral copies (<250 copies / mL). A negative  result must be combined with clinical observations, patient history, and epidemiological information. Fact Sheet for Patients:   StrictlyIdeas.no Fact Sheet for Healthcare Providers: BankingDealers.co.za This test is not yet approved or cleared  by the [place] FDA and has been authorized for detection and/or diagnosis of SARS-CoV-2 by FDA under an Emergency Use Authorization (EUA).  This EUA will remain in effect (meaning this test can be used) for the duration of the COVID-19 declaration under Section 564(b)(1) of the Act, 21 U.S.C. section 360bbb-3(b)(1), unless the authorization is terminated or revoked sooner. Performed at [facility], [address] [place], [place] [address]   MRSA PCR Screening     Status: None   Collection Time: 06/08/19  3:15 AM   Specimen: Urine, Catheterized; Nasopharyngeal  Result Value Ref Range Status   MRSA by PCR NEGATIVE NEGATIVE Final    Comment:        The GeneXpert MRSA Assay (FDA approved for NASAL specimens only), is one component of a comprehensive MRSA colonization surveillance program. It is not intended to diagnose MRSA infection nor to guide or monitor treatment for MRSA infections. Performed at [facility], [address] [place], [place] [address]     Lab Basic Metabolic Panel: Recent Labs  Lab 06/08/19 0404 06/08/19 1122 06/09/19 0358 06/09/19 0600 06/09/19 1400 06/09/19 1600 06/09/19 2000 06/09/19 2200 06/10/19 0017 06/10/19 0546 07/08/2019 0351  NA 136   < > 134*   < > 133*   < > 132* 131* 131* 130* 143  K 2.3*   < > 2.9*   < > 3.6   < > 5.2* 5.1 4.5 3.8 4.9  CL 97*   < > 100   < > 101   < > 101 102 101 98 94*  CO2 22   < > 24   < > 20*   < > 18* 18* 15* 18* 18*  GLUCOSE 178*   < > 140*   < > 149*   < > 134* 127* 111* 111* 130*  BUN 19   < > 13   < > 12   < > 13 14 13 14 18   CREATININE 0.76   < > 0.51   < > 0.60   < > 0.92 0.96 1.06* 1.30*  2.68*  CALCIUM 7.6*   < > 7.2*   < > 6.6*   < > 6.6* 6.5* 6.5* 6.2* 5.5*  MG 2.5*  --  1.5*  --  2.2  --   --   --   --  2.7* 2.3  PHOS 2.8  --  2.1*  --  3.0  --   --   --   --  5.3*  --    < > = values in this interval not displayed.   Liver Function Tests: Recent Labs  Lab 06/05/2019 2125 06/09/19 0358 06/09/19 0600 06/10/19 0546  AST 292* 139* 139*  --   ALT 241* 195* 198*  --   ALKPHOS 134* 93 93  --   BILITOT 0.9 1.1 1.3*  --   PROT 8.2* 5.6* 6.0*  --   ALBUMIN 4.5 2.9* 3.2* 2.7*   No results for input(s): LIPASE, AMYLASE in the last 168 hours. No results for input(s): AMMONIA in the last 168 hours. CBC: Recent Labs  Lab 06/02/2019 2125 06/08/19 0404 06/09/19 0600 06/10/19 0546 07/08/19 0351  WBC 26.1* 18.1* 13.6* 6.4 16.3*  NEUTROABS 16.0*  --  11.8*  --   --   HGB 13.9 13.7 13.6 11.5* 11.0*  HCT 39.9 37.3 37.6 32.3* 32.7*  MCV 90.9 87.4 87.4 91.0 98.5  PLT 377 316 238 306 148*   Cardiac Enzymes: No results for input(s): CKTOTAL, CKMB, CKMBINDEX, TROPONINI in the last 168 hours. Sepsis Labs: Recent Labs  Lab 06/08/19 0404 06/09/19 0600 06/10/19 0546 2019-07-08 0351  PROCALCITON 0.64  --   --   --   WBC 18.1* 13.6* 6.4 16.3*       Teyah Rossy 07/08/2019, 11:30 AM

## 2019-06-16 NOTE — Progress Notes (Signed)
This RN notified Meghan Hong, NP of a decreasing arterial BP of 99/43 with a MAP of 54. Patient is currently maxed out on pressors (vaso, levo, neo) and therefore no additional interventions can be fulfilled. Patient is DNR. Will continue to monitor.  Cameron Ali, RN

## 2019-06-16 NOTE — Progress Notes (Signed)
Inpatient Diabetes Program Recommendations  AACE/ADA: New Consensus Statement on Inpatient Glycemic Control   Target Ranges:  Prepandial:   less than 140 mg/dL      Peak postprandial:   less than 180 mg/dL (1-2 hours)      Critically ill patients:  140 - 180 mg/dL   Results for MIREYLI, VERDINE (MRN [id number]) as of 07-04-19 09:15  Ref. Range 2019-07-04 03:16 07-04-2019 04:02 2019-07-04 04:10 07/04/2019 04:28 Jul 04, 2019 07:28  Glucose-Capillary Latest Ref Range: 70 - 99 mg/dL 72 46 (L) 66 (L) 88 93  Results for JOUMANA, BEICHNER (MRN [id number]) as of 04-Jul-2019 09:15  Ref. Range 06/10/2019 07:29 06/10/2019 11:14 06/10/2019 15:19 06/10/2019 18:08 06/10/2019 19:34 06/10/2019 20:01 06/10/2019 23:15 06/10/2019 23:57  Glucose-Capillary Latest Ref Range: 70 - 99 mg/dL 124 (H)  Novolog 2 units 52 (L) 48 (L) 74 63 (L) 83 64 (L) 102 (H)   Review of Glycemic Control  Current orders for Inpatient glycemic control:  Novolog 0-15 units Q4H; Solucortef 50 mg Q6H, D10 @ 50 ml/hr  Inpatient Diabetes Program Recommendations:    Insulin-Correction: Please consider decreasing Novolog to 0-6 unit Q4H.  Thanks, Barnie Alderman, RN, MSN, CDE Diabetes Coordinator Inpatient Diabetes Program [phone] (Team Pager from 8am to 5pm)

## 2019-06-16 NOTE — Therapy (Signed)
ABG 06/09/2019 0500 BY SOBI DAVID, RCP:  PRVC-450 RATE-20 PEEP-5 FIO2-40%  DRAWN FROM A-LINE pH-7.39 pCO2-37 pO2-71 BD: -2.2 HCO3-22.4 sO2-96.2

## 2019-06-16 NOTE — Progress Notes (Signed)
The Clinical status was relayed to family in detail.  Updated and notified of patients medical condition.  Patient remains unresponsive and will not open eyes to command. Patient showing signs of severe brain damage   Explained to family course of therapy and the modalities     Patient with Progressive multiorgan failure with very low chance of meaningful recovery despite all aggressive and optimal medical therapy.  Patient is in the dying  Process associated with suffering.  Family understands the situation.  They have consented and agreed to DNR/DNI and would like to proceed with Comfort care measures.  Family are satisfied with Plan of action and management. All questions answered  Plan for CMO when family arrives  Additional CC time 42 mins   Annika Selke Patricia Pesa, M.D.  [facility]  Medical Director [place] Director [facility] Cardio-Pulmonary Department

## 2019-06-16 NOTE — Progress Notes (Signed)
Pt expired at 65 with Hollice Gong, RN as 2nd verification of time of death. Family at bedside and pt remained on ventilator with all vasopressors running at max rate until asystole occurred. Chaplain called and support given to pt and family. CDS called and this RN spoke with Page, referral # of [phone]. Family chooses [facility]. Mortimer Fries, MD to complete death certificate.

## 2019-06-16 NOTE — Progress Notes (Signed)
[facility] paged b/c pt. actively dying; family (husband, two dtrs.) at bedside when [facility] arrived.  Husband tearful, leaning against wall sitting in chair.  Pt. intubated, sedated, appeared to have swelling in limbs and face compared to when [facility] last saw her.  Dtr. Anderson Malta holding pt.'s hand and weeping; family began discussing funeral arrangements and this calmed everyone somewhat.  [facility] visited w/family and provided supportive presence for a little while and then gathered family around pt.'s bed for prayer.  After prayer Dickenson excused himself; family grateful for [facility] support --Dtr. Anderson Malta especially grateful for RN support and empathy re: loss of a parent.  [facility] monitored situation from RN station; Pt. died shortly afterwards.  [facility] reentered rm. briefly to comfort family; left family supporting one another.  No further needs expressed.    06/18/2019 1050  Clinical Encounter Type  Visited With Patient and family together  Visit Type Follow-up;Psychological support;Spiritual support;Social support;Patient actively dying;Critical Care  Referral From Nurse  Spiritual Encounters  Spiritual Needs Emotional;Grief support;Prayer  Stress Factors  Family Stress Factors Loss;Major life changes;Health changes

## 2019-06-16 DEATH — deceased

## 2019-08-12 ENCOUNTER — Ambulatory Visit: Payer: Managed Care, Other (non HMO) | Admitting: Internal Medicine

## 2020-12-26 ENCOUNTER — Other Ambulatory Visit: Payer: Self-pay | Admitting: *Deleted

## 2021-01-06 IMAGING — DX DG CHEST 1V PORT
1 series · 2 of 2 positions shown · non-contrast
Comparison: September 28, 2014

CLINICAL DATA: Cardiac arrest.

EXAM:
PORTABLE CHEST 1 VIEW

[Series 1: chest ap · 0.14mm/px · 2 of 2 slices shown]
[im 1/2]
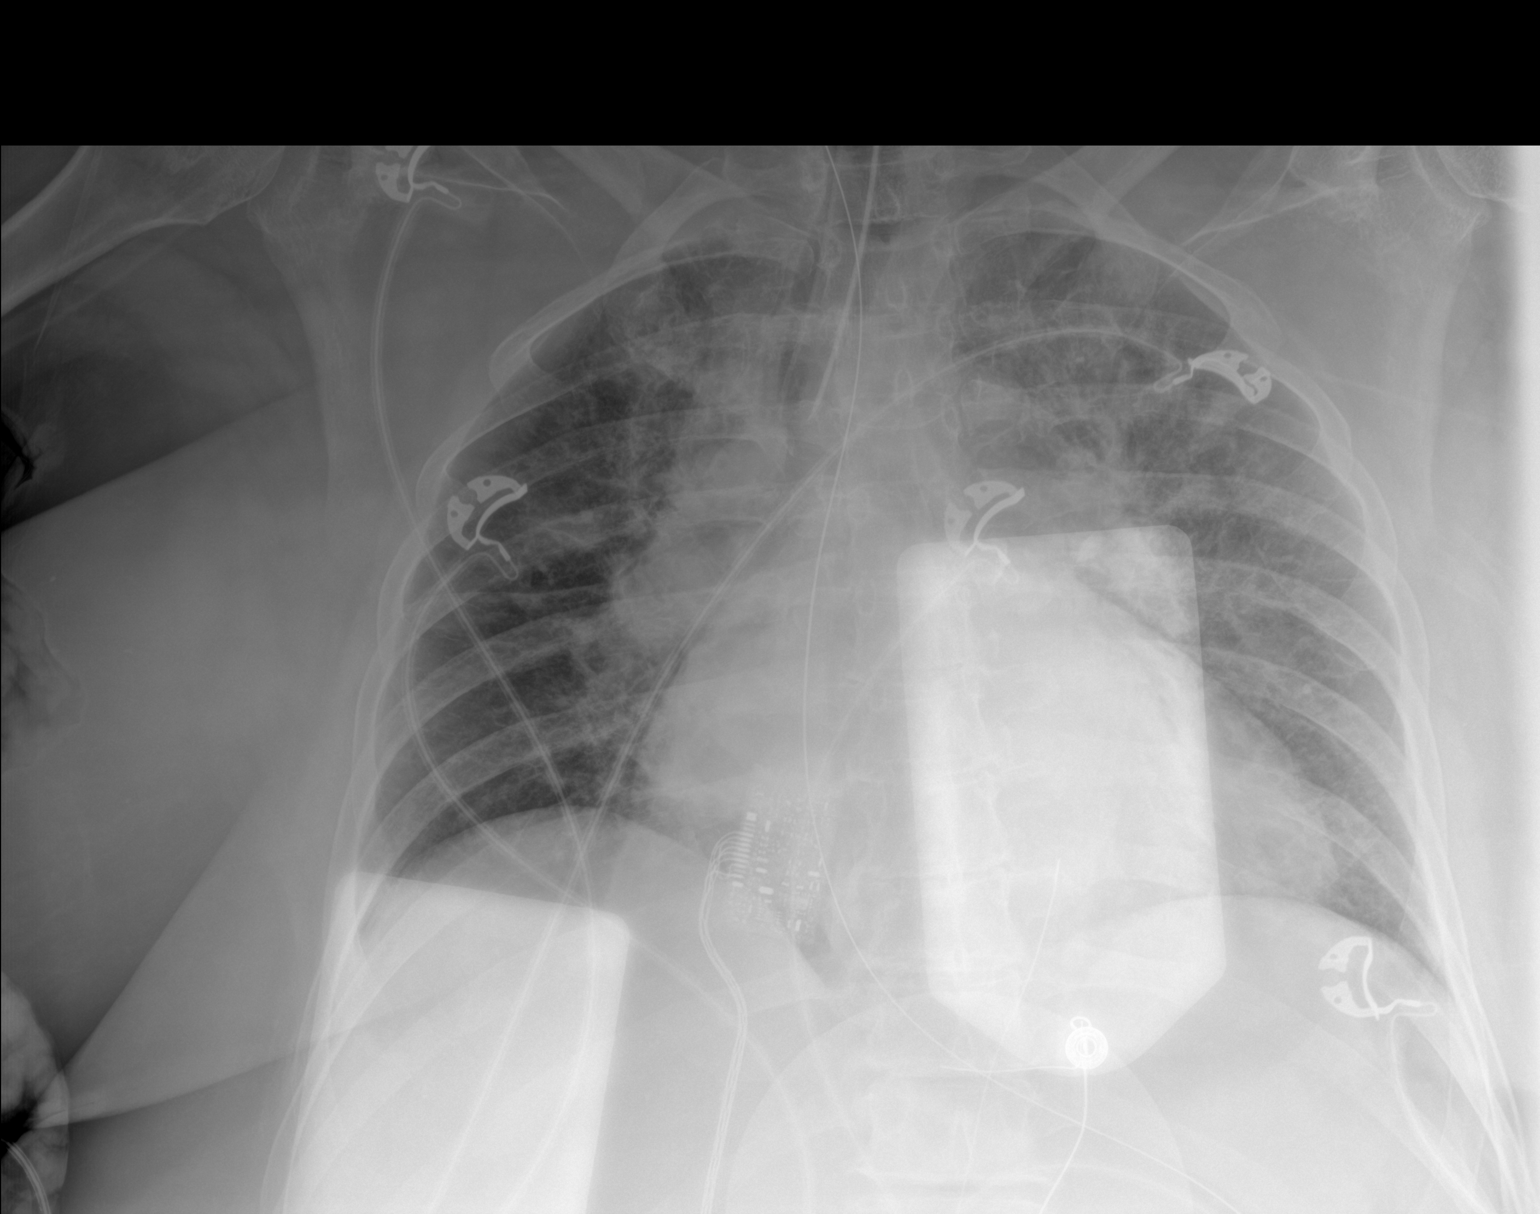
[im 2/2]
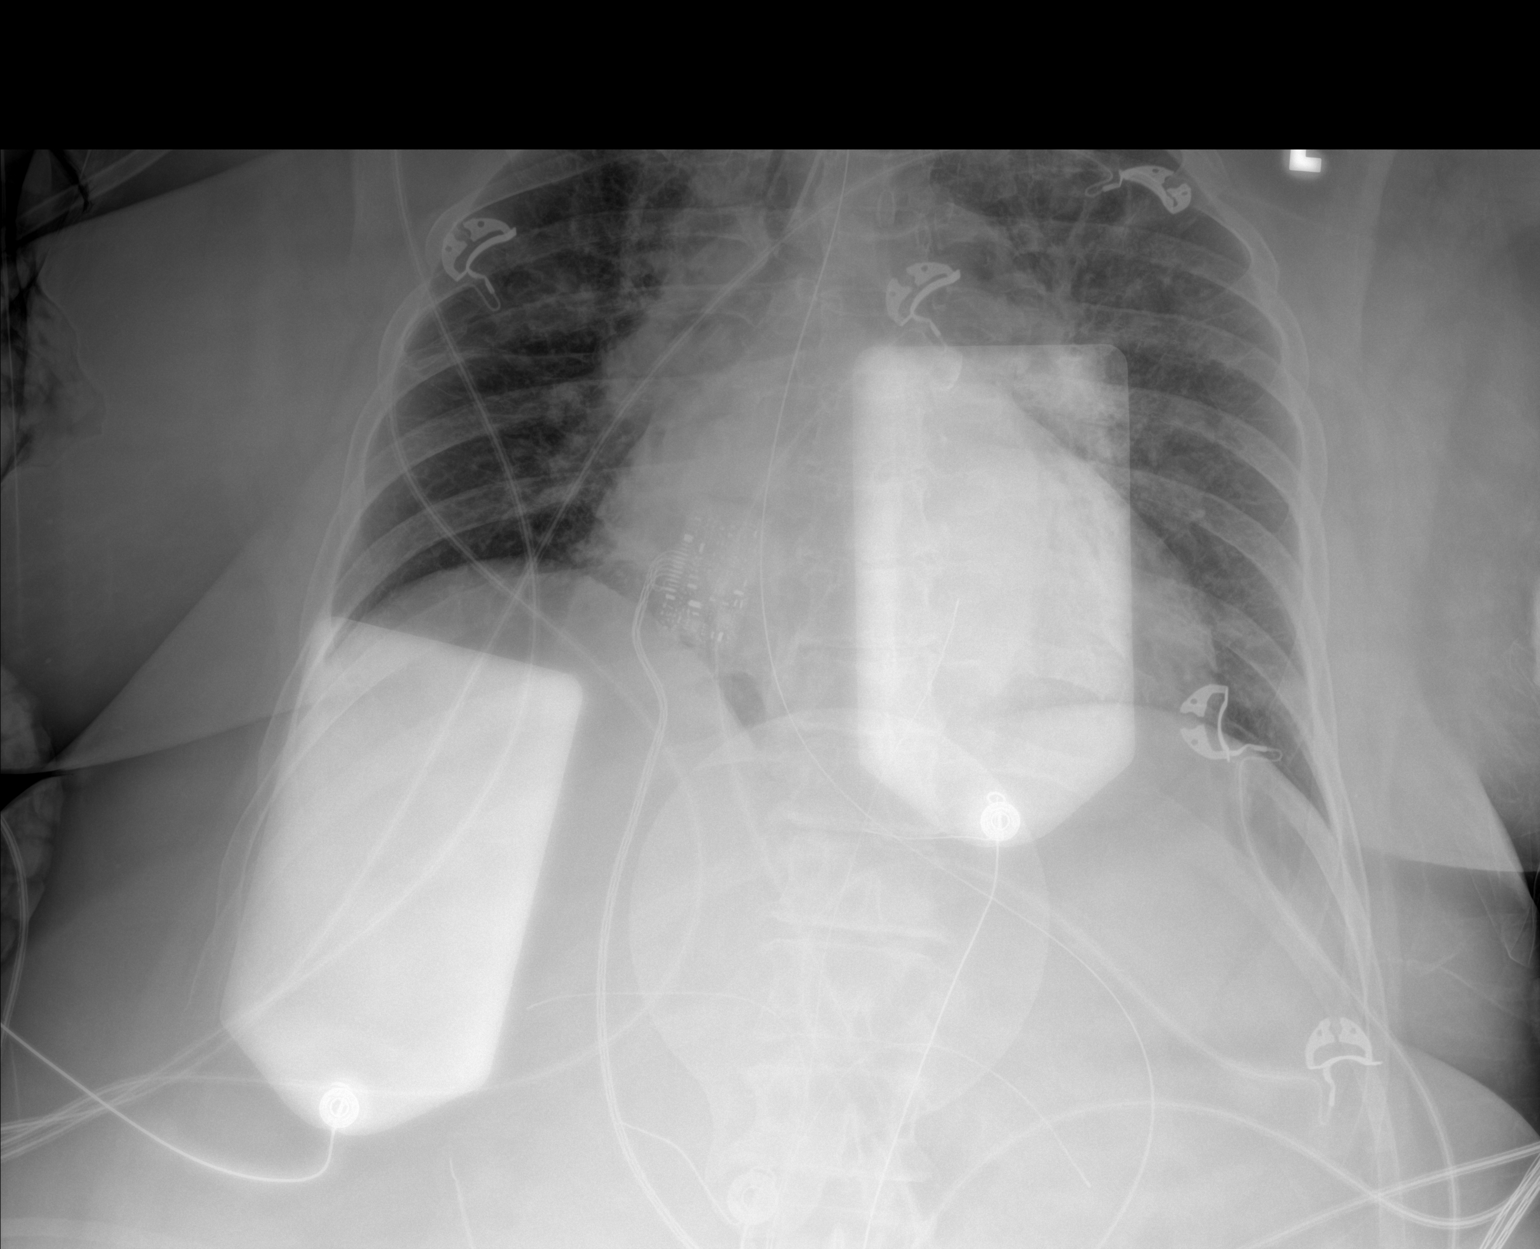

[2 of 2 positions shown; findings below may reference images not displayed]

FINDINGS: An endotracheal tube is seen with its distal tip approximately
cm from the carina. A nasogastric tube is noted with its distal end
seen within the body of the stomach. Mild to moderate severity
bilateral suprahilar and right infrahilar areas of atelectasis
and/or infiltrate are noted. There is no evidence of a pleural
effusion or pneumothorax. There is moderate to marked severity
enlargement of the cardiac silhouette. Degenerative changes seen
within the thoracic spine.
IMPRESSION: 1. Endotracheal tube and nasogastric tube in satisfactory position.
2. Moderate to marked severity bilateral atelectasis and/or
infiltrate.
3. Moderate to marked severity cardiomegaly.

## 2021-01-09 IMAGING — DX DG CHEST 1V PORT
1 series · 1 of 1 positions shown · non-contrast
Comparison: 06/08/2019

CLINICAL DATA: Acute respiratory failure.

EXAM:
PORTABLE CHEST 1 VIEW

[chest ap]
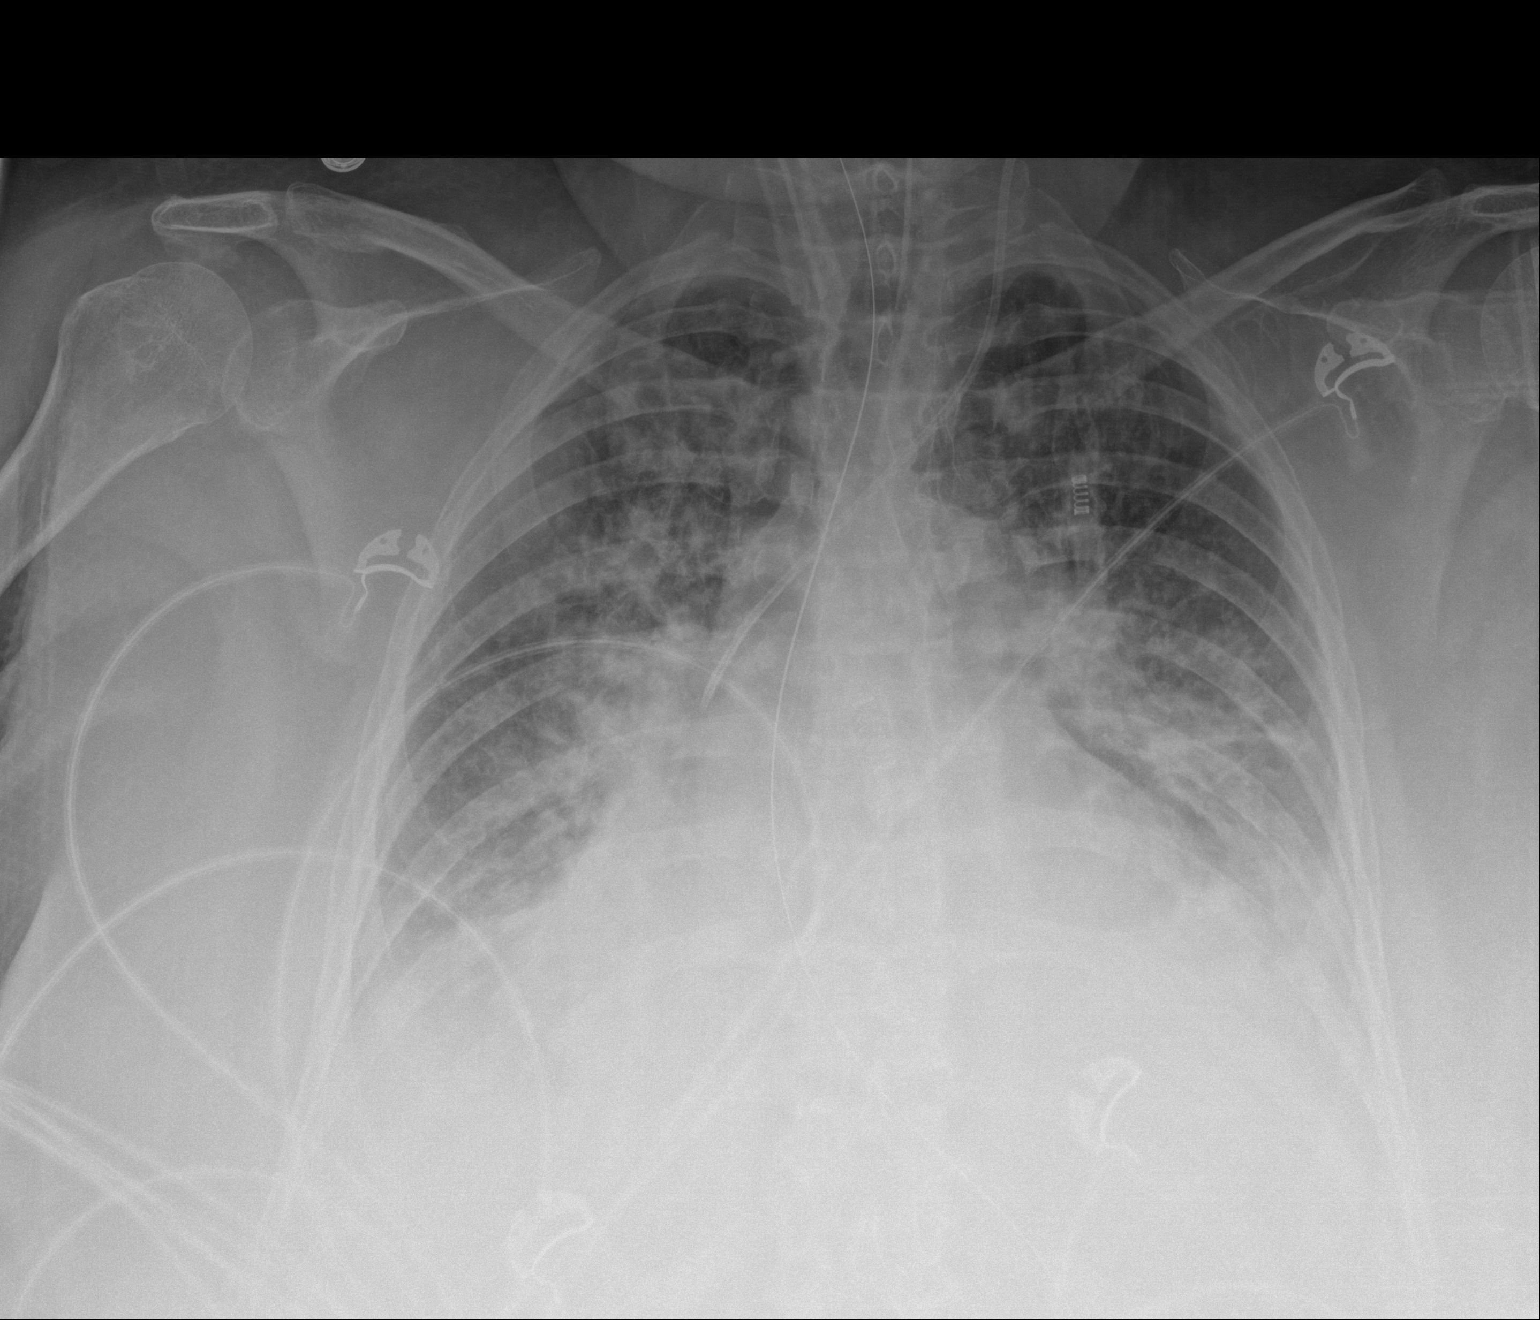

[1 of 1 positions shown; findings below may reference images not displayed]

FINDINGS: The endotracheal tube has been withdrawn and now terminates nearly 7
cm above the carina. Left jugular catheter terminates over the SVC,
unchanged. Enteric tube courses into the abdomen with tip not
imaged. The cardiac silhouette remains enlarged. Lung volumes remain
low with increasing airspace opacities in the mid and lower lungs
bilaterally as well as in the right greater than left upper lungs.
There may be small bilateral pleural effusions. No pneumothorax is
identified.
IMPRESSION: 1. Worsening bilateral airspace opacities which may reflect edema
with small pleural effusions.
2. Interval withdrawal of the endotracheal tube, now nearly 7 cm
above the carina.
